# Patient Record
Sex: Female | Born: 1960
Health system: Southern US, Community
[De-identification: ages and names within clinical notes are randomized; demographics above are authoritative.]

## PROBLEM LIST (undated history)

## (undated) DIAGNOSIS — I4891 Unspecified atrial fibrillation: Secondary | ICD-10-CM

## (undated) DIAGNOSIS — K5732 Diverticulitis of large intestine without perforation or abscess without bleeding: Secondary | ICD-10-CM

## (undated) DIAGNOSIS — Z972 Presence of dental prosthetic device (complete) (partial): Secondary | ICD-10-CM

## (undated) DIAGNOSIS — I10 Essential (primary) hypertension: Secondary | ICD-10-CM

## (undated) DIAGNOSIS — J189 Pneumonia, unspecified organism: Secondary | ICD-10-CM

## (undated) DIAGNOSIS — I499 Cardiac arrhythmia, unspecified: Secondary | ICD-10-CM

## (undated) DIAGNOSIS — M1990 Unspecified osteoarthritis, unspecified site: Secondary | ICD-10-CM

## (undated) DIAGNOSIS — Z973 Presence of spectacles and contact lenses: Secondary | ICD-10-CM

## (undated) DIAGNOSIS — F419 Anxiety disorder, unspecified: Secondary | ICD-10-CM

## (undated) DIAGNOSIS — E785 Hyperlipidemia, unspecified: Secondary | ICD-10-CM

## (undated) HISTORY — PX: PLACEMENT OF BREAST IMPLANTS: SHX6334

## (undated) HISTORY — PX: OTHER SURGICAL HISTORY: SHX169

## (undated) HISTORY — PX: REDUCTION MAMMAPLASTY: SUR839

## (undated) HISTORY — PX: CARDIAC ELECTROPHYSIOLOGY MAPPING AND ABLATION: SHX1292

## (undated) HISTORY — DX: Essential (primary) hypertension: I10

## (undated) HISTORY — PX: TONSILLECTOMY AND ADENOIDECTOMY: SHX28

## (undated) HISTORY — DX: Hyperlipidemia, unspecified: E78.5

## (undated) HISTORY — PX: CHOLECYSTECTOMY: SHX55

## (undated) HISTORY — PX: VAGINAL HYSTERECTOMY: SHX2639

## (undated) HISTORY — PX: BREAST IMPLANT REMOVAL: SUR1101

## (undated) HISTORY — DX: Diverticulitis of large intestine without perforation or abscess without bleeding: K57.32

## (undated) HISTORY — PX: ABDOMINAL HYSTERECTOMY: SHX81

---

## 1987-08-27 HISTORY — PX: ABDOMINAL HYSTERECTOMY: SHX81

## 1998-01-02 ENCOUNTER — Emergency Department (HOSPITAL_COMMUNITY): Admission: EM | Admit: 1998-01-02 | Discharge: 1998-01-02 | Payer: Self-pay | Admitting: Emergency Medicine

## 1998-08-26 HISTORY — PX: BLADDER SURGERY: SHX569

## 1998-09-27 ENCOUNTER — Emergency Department (HOSPITAL_COMMUNITY): Admission: EM | Admit: 1998-09-27 | Discharge: 1998-09-27 | Payer: Self-pay | Admitting: Emergency Medicine

## 2000-05-30 ENCOUNTER — Emergency Department (HOSPITAL_COMMUNITY): Admission: EM | Admit: 2000-05-30 | Discharge: 2000-05-31 | Payer: Self-pay | Admitting: Emergency Medicine

## 2000-05-31 ENCOUNTER — Encounter: Payer: Self-pay | Admitting: Emergency Medicine

## 2008-09-12 ENCOUNTER — Emergency Department (HOSPITAL_COMMUNITY): Admission: EM | Admit: 2008-09-12 | Discharge: 2008-09-13 | Payer: Self-pay | Admitting: Emergency Medicine

## 2008-09-15 ENCOUNTER — Ambulatory Visit: Payer: Self-pay

## 2008-09-15 ENCOUNTER — Ambulatory Visit: Payer: Self-pay | Admitting: Cardiology

## 2008-09-16 ENCOUNTER — Ambulatory Visit: Payer: Self-pay

## 2008-09-16 ENCOUNTER — Encounter: Payer: Self-pay | Admitting: Cardiology

## 2008-10-25 ENCOUNTER — Ambulatory Visit: Payer: Self-pay | Admitting: Cardiology

## 2008-10-25 ENCOUNTER — Encounter: Payer: Self-pay | Admitting: Cardiology

## 2008-10-25 DIAGNOSIS — F17200 Nicotine dependence, unspecified, uncomplicated: Secondary | ICD-10-CM | POA: Insufficient documentation

## 2008-10-25 DIAGNOSIS — R002 Palpitations: Secondary | ICD-10-CM | POA: Insufficient documentation

## 2008-10-25 DIAGNOSIS — R0602 Shortness of breath: Secondary | ICD-10-CM | POA: Insufficient documentation

## 2009-01-13 ENCOUNTER — Telehealth: Payer: Self-pay | Admitting: Cardiology

## 2009-03-13 ENCOUNTER — Encounter (INDEPENDENT_AMBULATORY_CARE_PROVIDER_SITE_OTHER): Payer: Self-pay | Admitting: *Deleted

## 2009-06-07 ENCOUNTER — Telehealth: Payer: Self-pay | Admitting: Cardiology

## 2009-06-07 ENCOUNTER — Encounter: Payer: Self-pay | Admitting: Cardiology

## 2009-08-14 ENCOUNTER — Telehealth: Payer: Self-pay | Admitting: Cardiology

## 2009-08-24 ENCOUNTER — Encounter (INDEPENDENT_AMBULATORY_CARE_PROVIDER_SITE_OTHER): Payer: Self-pay | Admitting: *Deleted

## 2010-12-10 LAB — CBC
HCT: 43.9 % (ref 36.0–46.0)
MCV: 93.8 fL (ref 78.0–100.0)
Platelets: 218 10*3/uL (ref 150–400)
RDW: 12.4 % (ref 11.5–15.5)

## 2010-12-10 LAB — BASIC METABOLIC PANEL
BUN: 12 mg/dL (ref 6–23)
CO2: 25 mEq/L (ref 19–32)
Calcium: 9.3 mg/dL (ref 8.4–10.5)
Chloride: 108 mEq/L (ref 96–112)
Creatinine, Ser: 0.76 mg/dL (ref 0.4–1.2)
GFR calc Af Amer: >60 mL/min (ref >60)
GFR calc non Af Amer: >60 mL/min (ref >60)
Glucose, Bld: 96 mg/dL (ref 70–99)
Potassium: 3.7 mEq/L (ref 3.5–5.1)
Sodium: 142 mEq/L (ref 135–145)

## 2010-12-10 LAB — URINALYSIS, ROUTINE W REFLEX MICROSCOPIC
Bilirubin Urine: NEGATIVE
Glucose, UA: NEGATIVE mg/dL
Hgb urine dipstick: NEGATIVE
Ketones, ur: NEGATIVE mg/dL
Nitrite: NEGATIVE
Protein, ur: NEGATIVE mg/dL
Specific Gravity, Urine: 1.009 (ref 1.005–1.030)
Urobilinogen, UA: 0.2 mg/dL (ref 0.0–1.0)
pH: 5.5 (ref 5.0–8.0)

## 2010-12-10 LAB — POCT CARDIAC MARKERS
CKMB, poc: <1 ng/mL — ABNORMAL LOW (ref 1.0–8.0)
CKMB, poc: <1 ng/mL — ABNORMAL LOW (ref 1.0–8.0)
Troponin i, poc: <0.05 ng/mL (ref 0.00–0.09)

## 2010-12-10 LAB — RAPID URINE DRUG SCREEN, HOSP PERFORMED
Amphetamines: NOT DETECTED
Barbiturates: NOT DETECTED
Benzodiazepines: NOT DETECTED
Cocaine: NOT DETECTED
Opiates: NOT DETECTED
Tetrahydrocannabinol: NOT DETECTED

## 2010-12-10 LAB — DIFFERENTIAL
Basophils Absolute: 0.1 10*3/uL (ref 0.0–0.1)
Eosinophils Absolute: 0.1 10*3/uL (ref 0.0–0.7)
Eosinophils Relative: 1 % (ref 0–5)

## 2010-12-10 LAB — PROTIME-INR: Prothrombin Time: 13.1 seconds (ref 11.6–15.2)

## 2010-12-10 LAB — CK TOTAL AND CKMB (NOT AT ARMC): Total CK: 268 U/L — ABNORMAL HIGH (ref 7–177)

## 2010-12-10 LAB — TROPONIN I: Troponin I: 0.01 ng/mL (ref 0.00–0.06)

## 2011-01-08 NOTE — Assessment & Plan Note (Signed)
[facility]                            CARDIOLOGY OFFICE NOTE   BERNARDETTE, [name]                        MRN:          [id number]  DATE:09/15/2008                            DOB:          1961-01-20    The patient is a 50 year old female with no prior cardiac history, who I  am asked to evaluate for palpitations and dyspnea.  Note, she typically  does not have dyspnea on exertion unless is with more extreme  activities.  There is no orthopnea, PND, pedal edema, syncope, or chest  pain.  Approximately 7 months ago, she had an episode at work, when she  was sitting down, where her heart started pounding heart and racing.  This lasts for approximately 30 minutes and they have resolved  spontaneously.  It was sudden in onset and not related activities.  There is no associated chest pain, shortness breath, or syncope.  She  had a second episode several months later.  On September 12, 2008, the  patient had an episode that was more sustained.  This had began at work  and she went home, but it persisted.  Also, note that she tried to  exercise at the gym with this, but felt some dizziness.  Ultimately, she  was taken to [facility].  I do have electrocardiograms  from that evaluation.  The initial electrocardiogram showed a sinus  tachycardia at rate of 127.  There was a right ventricular conduction  delay.  A prior inferior infarct cannot be excluded.  She had a second  electrocardiogram that showed a sinus tachycardia at a rate of 119.  Again, a prior inferior infarct cannot be excluded.  She was told that  she maybe somewhat dehydrated.  They also check TSH and hemoglobin that  was normal.  We were asked to further evaluate.  She has felt fine since  then.   MEDICATIONS:  Detrol 4 mg p.o. daily.   She has no known drug allergies.   SOCIAL HISTORY:  She does smoke.  She rarely consumes alcohol.  She is  married.   FAMILY HISTORY:   Significant for father who had congestive heart  failure.  Her mother has had a previous pacemaker.   PAST MEDICAL HISTORY:  Significant for a multiple abdominal surgeries.  She has had a prior cholecystectomy, hysterectomy, C-section, and  appendectomy.  She has had exterior and interior wraps and  reconstruction per her bladder.   REVIEW OF SYSTEMS:  She denies any headaches, fevers, or chills.  There  is no productive cough or hemoptysis.  There is no dysphagia,  odynophagia, melena, or hematochezia.  There is no dysuria or  hematuria,.  There is no rash or seizure activity.  There is no  orthopnea, PND, or pedal edema.  She has lost approximately 10 pounds  recently due to exercise and diet by her.  Remaining systems are  negative.   PHYSICAL EXAMINATION:  VITAL SIGNS:  Today shows a blood pressure of  118/80 and her pulse is 56.  She weighs 225 pounds.  GENERAL:  She  is well-developed and somewhat obese.  She is in no acute  distress at present.  SKIN:  Warm and dry.  She does not appear to be depressed.  There is no  peripheral clubbing.  BACK:  Normal.  HEENT:  Normal with normal eyelids.  NECK:  Supple with normal upstroke bilaterally.  There are no bruits  noted.  There is no jugular venous distention and I cannot appreciate  thyromegaly.  CHEST:  Clear to auscultation.  There is no expansion.  CARDIOVASCULAR:  Regular rhythm with normal S1 and S2.  There are no  murmurs, rubs, or gallops noted.  ABDOMEN:  Nontender.  Positive bowel sounds.  No hepatosplenomegaly.  No  masses are appreciated.  There is no abdominal bruit.  EXTREMITIES:  She has 2+ femoral pulses bilaterally without any bruits.  Extremities showed no edema.  I can palpate no cords.  She has 2+  dorsalis pedis pulses bilaterally.  NEUROLOGIC:  Grossly intact.   DIAGNOSES:  Recent palpitations - the patient's description sound  concerning for supraventricular tachycardia.  She does have   electrocardiograms from the emergency room that showed sinus  tachycardia, but it is not clear to me that she was having the symptoms  at that time.  I will schedule her to have CardioNet monitor for full  evaluation.  We will also schedule her to have an echocardiogram to  quantify left ventricular function.  I will see her back in 4-6 weeks  and review this.  If indeed she is having palpitations related to sinus  tachycardia, and we may need to add a beta-blocker.  If she has a  significant arrhythmia, then we could consider referral to an  electrophysiologist for ablation.     Madolyn Frieze Jens Som, MD, [facility]  Electronically Signed    BSC/MedQ  DD: 09/15/2008  DT: 09/16/2008  Job #: [id number]   cc:   Jocelyn Lamer D. Pecola Leisure, M.D.

## 2013-08-26 HISTORY — PX: LOOP RECORDER IMPLANT: SHX5954

## 2013-08-26 HISTORY — PX: OTHER SURGICAL HISTORY: SHX169

## 2014-02-07 ENCOUNTER — Other Ambulatory Visit: Payer: Self-pay | Admitting: Internal Medicine

## 2014-02-07 DIAGNOSIS — Z139 Encounter for screening, unspecified: Secondary | ICD-10-CM

## 2014-02-09 ENCOUNTER — Other Ambulatory Visit: Payer: Self-pay | Admitting: Internal Medicine

## 2014-02-09 DIAGNOSIS — Z1211 Encounter for screening for malignant neoplasm of colon: Secondary | ICD-10-CM

## 2014-02-14 ENCOUNTER — Other Ambulatory Visit: Payer: Self-pay

## 2014-04-04 ENCOUNTER — Inpatient Hospital Stay: Admission: RE | Admit: 2014-04-04 | Payer: Self-pay | Source: Ambulatory Visit

## 2015-07-26 ENCOUNTER — Other Ambulatory Visit: Payer: Self-pay | Admitting: Family Medicine

## 2015-07-26 DIAGNOSIS — Q438 Other specified congenital malformations of intestine: Secondary | ICD-10-CM

## 2017-01-16 ENCOUNTER — Ambulatory Visit (INDEPENDENT_AMBULATORY_CARE_PROVIDER_SITE_OTHER): Payer: Self-pay

## 2017-01-16 ENCOUNTER — Ambulatory Visit (INDEPENDENT_AMBULATORY_CARE_PROVIDER_SITE_OTHER): Payer: BLUE CROSS/BLUE SHIELD | Admitting: Orthopaedic Surgery

## 2017-01-16 ENCOUNTER — Encounter (INDEPENDENT_AMBULATORY_CARE_PROVIDER_SITE_OTHER): Payer: Self-pay | Admitting: Orthopaedic Surgery

## 2017-01-16 DIAGNOSIS — M2242 Chondromalacia patellae, left knee: Secondary | ICD-10-CM | POA: Diagnosis not present

## 2017-01-16 MED ORDER — BUPIVACAINE HCL 0.5 % IJ SOLN
2.0000 mL | INTRAMUSCULAR | Status: AC | PRN
  Administered 2017-01-16: 2 mL via INTRA_ARTICULAR

## 2017-01-16 MED ORDER — LIDOCAINE HCL 1 % IJ SOLN
2.0000 mL | INTRAMUSCULAR | Status: AC | PRN
Start: 2017-01-16 — End: 2017-01-16
  Administered 2017-01-16: 2 mL

## 2017-01-16 MED ORDER — METHYLPREDNISOLONE ACETATE 40 MG/ML IJ SUSP
40.0000 mg | INTRAMUSCULAR | Status: AC | PRN
  Administered 2017-01-16: 40 mg via INTRA_ARTICULAR

## 2017-01-16 NOTE — Progress Notes (Signed)
Office Visit Note   Patient: [name]           Date of Birth: 1961-04-04           MRN: [id number] Visit Date: 01/16/2017              Requested by: No referring provider defined for this encounter. PCP: Chesley Noon, MD   Assessment & Plan: Visit Diagnoses:  1. Chondromalacia of left patella     Plan: Impression is left chondromalacia patella. I discussed exercise, weight loss, quad strengthening. We also performed a knee injection which she tolerated well and had good immediate relief. Home exercise program was also given. Follow up with me as needed.  Follow-Up Instructions: Return if symptoms worsen or fail to improve.   Orders:  Orders Placed This Encounter  Procedures  . XR KNEE 3 VIEW LEFT   No orders of the defined types were placed in this encounter.     Procedures: Large Joint Inj Date/Time: 01/16/2017 11:49 AM Performed by: Leandrew Koyanagi Authorized by: Leandrew Koyanagi   Consent Given by:  Patient Timeout: prior to procedure the correct patient, procedure, and site was verified   Indications:  Pain Location:  Knee Site:  R knee Prep: patient was prepped and draped in usual sterile fashion   Needle Size:  22 G Ultrasound Guidance: No   Fluoroscopic Guidance: No   Arthrogram: No   Medications:  2 mL lidocaine 1 %; 2 mL bupivacaine 0.5 %; 40 mg methylPREDNISolone acetate 40 MG/ML Patient tolerance:  Patient tolerated the procedure well with no immediate complications     Clinical Data: No additional findings.   Subjective: Chief Complaint  Patient presents with  . Left Knee - Pain, New Patient (Initial Visit)    Patient is a [name] comes in with left knee pain for about a week and half. She endorses stiffness and swelling and worsening pain with flexion knee. This is also worse when riding her motorcycle. She feels like the knee wants to give out. She also feels a tightness in the back of her knee. She takes ibuprofen which  does give relief. She denies any mechanical symptoms.    Review of Systems  Constitutional: Negative.   HENT: Negative.   Eyes: Negative.   Respiratory: Negative.   Cardiovascular: Negative.   Endocrine: Negative.   Musculoskeletal: Negative.   Neurological: Negative.   Hematological: Negative.   Psychiatric/Behavioral: Negative.   All other systems reviewed and are negative.    Objective: Vital Signs: There were no vitals taken for this visit.  Physical Exam  Constitutional: She is oriented to person, place, and time. She appears well-developed and well-nourished.  HENT:  Head: Normocephalic and atraumatic.  Eyes: EOM are normal.  Neck: Neck supple.  Pulmonary/Chest: Effort normal.  Abdominal: Soft.  Neurological: She is alert and oriented to person, place, and time.  Skin: Skin is warm. Capillary refill takes less than 2 seconds.  Psychiatric: She has a normal mood and affect. Her behavior is normal. Judgment and thought content normal.  Nursing note and vitals reviewed.   Ortho Exam Left knee exam shows no joint effusion. She does have positive patellar crepitus. Collaterals and cruciate's are stable. No joint line tenderness. Patellar tracking is normal. Specialty Comments:  No specialty comments available.  Imaging: Xr Knee 3 View Left  Result Date: 01/16/2017 Mild osteoarthritis    PMFS History: Patient Active Problem List   Diagnosis Date Noted  .  Chondromalacia of left patella 01/16/2017  . TOBACCO ABUSE 10/25/2008  . PALPITATIONS 10/25/2008  . DYSPNEA 10/25/2008   No past medical history on file.  No family history on file.  Past Surgical History:  Procedure Laterality Date  . CESAREAN SECTION    . CHOLECYSTECTOMY    . fallopian tube removal    . TONSILLECTOMY AND ADENOIDECTOMY    . VAGINAL HYSTERECTOMY     Social History   Occupational History  . machine operator    Social History Main Topics  . Smoking status: Former Research scientist (life sciences)  .  Smokeless tobacco: Never Used  . Alcohol use Not on file  . Drug use: Unknown  . Sexual activity: Not on file

## 2017-02-12 DIAGNOSIS — Z1211 Encounter for screening for malignant neoplasm of colon: Secondary | ICD-10-CM | POA: Diagnosis not present

## 2017-02-12 DIAGNOSIS — I480 Paroxysmal atrial fibrillation: Secondary | ICD-10-CM | POA: Diagnosis not present

## 2017-02-12 DIAGNOSIS — F411 Generalized anxiety disorder: Secondary | ICD-10-CM | POA: Diagnosis not present

## 2017-02-12 DIAGNOSIS — R32 Unspecified urinary incontinence: Secondary | ICD-10-CM | POA: Diagnosis not present

## 2017-03-17 ENCOUNTER — Ambulatory Visit (INDEPENDENT_AMBULATORY_CARE_PROVIDER_SITE_OTHER): Payer: BLUE CROSS/BLUE SHIELD | Admitting: Orthopaedic Surgery

## 2017-03-17 ENCOUNTER — Encounter (INDEPENDENT_AMBULATORY_CARE_PROVIDER_SITE_OTHER): Payer: Self-pay | Admitting: Orthopaedic Surgery

## 2017-03-17 DIAGNOSIS — M2242 Chondromalacia patellae, left knee: Secondary | ICD-10-CM | POA: Diagnosis not present

## 2017-03-17 MED ORDER — DICLOFENAC SODIUM 75 MG PO TBEC: 75.0000 mg | DELAYED_RELEASE_TABLET | Freq: Two times a day (BID) | ORAL | 2 refills | Status: DC

## 2017-03-17 NOTE — Progress Notes (Signed)
   Office Visit Note   Patient: [name]           Date of Birth: 01-23-1961           MRN: [id number] Visit Date: 03/17/2017              Requested by: Chesley Noon, MD [place], [place] [address] PCP: Chesley Noon, MD   Assessment & Plan: Visit Diagnoses:  1. Chondromalacia of left patella     Plan: Doppler to rule out DVT and MRI of the left knee to rule out structural abnormalities given failure of conservative treatment. Follow-up after the MRI. I will be in touch with the patient regarding the Doppler.  Follow-Up Instructions: Return in about 2 weeks (around 03/31/2017).   Orders:  No orders of the defined types were placed in this encounter.  Meds ordered this encounter  Medications  . diclofenac (VOLTAREN) 75 MG EC tablet    Sig: Take 1 tablet (75 mg total) by mouth 2 (two) times daily.    Dispense:  30 tablet    Refill:  2      Procedures: No procedures performed   Clinical Data: No additional findings.   Subjective: Chief Complaint  Patient presents with  . Left Knee - Pain    [name] comes back today for continued left knee pain and leg swelling. Her calf is also painful to palpation. Denies any numbness or tingling. Denies any chest pain shortness of breath.    Review of Systems  Constitutional: Negative.   HENT: Negative.   Eyes: Negative.   Respiratory: Negative.   Cardiovascular: Negative.   Endocrine: Negative.   Musculoskeletal: Negative.   Neurological: Negative.   Hematological: Negative.   Psychiatric/Behavioral: Negative.   All other systems reviewed and are negative.    Objective: Vital Signs: There were no vitals taken for this visit.  Physical Exam  Constitutional: She is oriented to person, place, and time. She appears well-developed and well-nourished.  Pulmonary/Chest: Effort normal.  Neurological: She is alert and oriented to person, place, and time.  Skin: Skin is warm. Capillary refill  takes less than 2 seconds.  Psychiatric: She has a normal mood and affect. Her behavior is normal. Judgment and thought content normal.  Nursing note and vitals reviewed.   Ortho Exam Left knee exam shows no joint effusion. She does have swelling of her left ankle and lower extremity. Specialty Comments:  No specialty comments available.  Imaging: No results found.   PMFS History: Patient Active Problem List   Diagnosis Date Noted  . Chondromalacia of left patella 01/16/2017  . TOBACCO ABUSE 10/25/2008  . PALPITATIONS 10/25/2008  . DYSPNEA 10/25/2008   No past medical history on file.  No family history on file.  Past Surgical History:  Procedure Laterality Date  . CESAREAN SECTION    . CHOLECYSTECTOMY    . fallopian tube removal    . TONSILLECTOMY AND ADENOIDECTOMY    . VAGINAL HYSTERECTOMY     Social History   Occupational History  . machine operator    Social History Main Topics  . Smoking status: Former Research scientist (life sciences)  . Smokeless tobacco: Never Used  . Alcohol use Not on file  . Drug use: Unknown  . Sexual activity: Not on file

## 2017-03-17 NOTE — Addendum Note (Signed)
Addended by: Precious Bard on: 03/17/2017 03:33 PM   Modules accepted: Orders

## 2017-03-20 ENCOUNTER — Ambulatory Visit (HOSPITAL_COMMUNITY): Payer: BLUE CROSS/BLUE SHIELD

## 2017-03-21 ENCOUNTER — Ambulatory Visit (HOSPITAL_COMMUNITY): Payer: BLUE CROSS/BLUE SHIELD

## 2017-03-21 ENCOUNTER — Ambulatory Visit (HOSPITAL_COMMUNITY)
Admission: RE | Admit: 2017-03-21 | Discharge: 2017-03-21 | Disposition: A | Discharge status: Home / Self Care | Payer: BLUE CROSS/BLUE SHIELD | Source: Ambulatory Visit | Attending: Orthopaedic Surgery | Admitting: Orthopaedic Surgery

## 2017-03-21 DIAGNOSIS — M2242 Chondromalacia patellae, left knee: Secondary | ICD-10-CM | POA: Diagnosis not present

## 2017-03-21 NOTE — Progress Notes (Signed)
VASCULAR LAB PRELIMINARY  PRELIMINARY  PRELIMINARY  PRELIMINARY  Left lower extremity venous duplex completed.    Preliminary report:  Left:  No evidence of DVT, superficial thrombosis, or Baker's cyst.  Kaicee Scarpino, RVS 03/21/2017, 3:35 PM

## 2017-03-29 ENCOUNTER — Ambulatory Visit
Admission: RE | Admit: 2017-03-29 | Discharge: 2017-03-29 | Disposition: A | Discharge status: Home / Self Care | Payer: BLUE CROSS/BLUE SHIELD | Source: Ambulatory Visit | Attending: Orthopaedic Surgery | Admitting: Orthopaedic Surgery

## 2017-03-29 DIAGNOSIS — M2242 Chondromalacia patellae, left knee: Secondary | ICD-10-CM

## 2017-03-29 DIAGNOSIS — M25562 Pain in left knee: Secondary | ICD-10-CM | POA: Diagnosis not present

## 2017-03-31 ENCOUNTER — Ambulatory Visit (INDEPENDENT_AMBULATORY_CARE_PROVIDER_SITE_OTHER): Payer: BLUE CROSS/BLUE SHIELD | Admitting: Orthopaedic Surgery

## 2017-03-31 DIAGNOSIS — M2242 Chondromalacia patellae, left knee: Secondary | ICD-10-CM | POA: Diagnosis not present

## 2017-03-31 NOTE — Progress Notes (Signed)
   Office Visit Note   Patient: [name]           Date of Birth: 04-16-1961           MRN: [id number] Visit Date: 03/31/2017              Requested by: Chesley Noon, MD [place], [place] [address] PCP: Chesley Noon, MD   Assessment & Plan: Visit Diagnoses:  1. Chondromalacia of left patella     Plan: Insurance preapproval for Synvisc injection submitted today. MRI essentially shows degenerative changes without any acute meniscal pathology. Small Baker cyst. Follow-up for the injection.  Follow-Up Instructions: Return if symptoms worsen or fail to improve.   Orders:  No orders of the defined types were placed in this encounter.  No orders of the defined types were placed in this encounter.     Procedures: No procedures performed   Clinical Data: No additional findings.   Subjective: Chief Complaint  Patient presents with  . Left Knee - Pain    [name] follows up today to review her left knee MRI. She continues to have pain. Diclofenac later swell up. Cortisone gave her minimal and temporary relief.    Review of Systems   Objective: Vital Signs: There were no vitals taken for this visit.  Physical Exam  Ortho Exam Left knee exam is stable. Specialty Comments:  No specialty comments available.  Imaging: No results found.   PMFS History: Patient Active Problem List   Diagnosis Date Noted  . Chondromalacia of left patella 01/16/2017  . TOBACCO ABUSE 10/25/2008  . PALPITATIONS 10/25/2008  . DYSPNEA 10/25/2008   No past medical history on file.  No family history on file.  Past Surgical History:  Procedure Laterality Date  . CESAREAN SECTION    . CHOLECYSTECTOMY    . fallopian tube removal    . TONSILLECTOMY AND ADENOIDECTOMY    . VAGINAL HYSTERECTOMY     Social History   Occupational History  . machine operator    Social History Main Topics  . Smoking status: Former Research scientist (life sciences)  . Smokeless tobacco: Never Used    . Alcohol use Not on file  . Drug use: Unknown  . Sexual activity: Not on file

## 2017-04-24 ENCOUNTER — Telehealth (INDEPENDENT_AMBULATORY_CARE_PROVIDER_SITE_OTHER): Payer: Self-pay | Admitting: Orthopaedic Surgery

## 2017-04-24 DIAGNOSIS — M1712 Unilateral primary osteoarthritis, left knee: Secondary | ICD-10-CM | POA: Diagnosis not present

## 2017-04-24 NOTE — Telephone Encounter (Signed)
Returned call to [employer] spoke with terri she advised will ship the Synvisc tomorrow for patient. Via [employer]  Ph# [phone]

## 2017-04-29 ENCOUNTER — Ambulatory Visit (INDEPENDENT_AMBULATORY_CARE_PROVIDER_SITE_OTHER): Payer: BLUE CROSS/BLUE SHIELD | Admitting: Orthopaedic Surgery

## 2017-04-29 DIAGNOSIS — M2242 Chondromalacia patellae, left knee: Secondary | ICD-10-CM

## 2017-04-29 MED ORDER — HYLAN G-F 20 48 MG/6ML IX SOSY
48.0000 mg | PREFILLED_SYRINGE | INTRA_ARTICULAR | Status: AC | PRN
  Administered 2017-04-29: 48 mg via INTRA_ARTICULAR

## 2017-04-29 NOTE — Progress Notes (Signed)
   Procedure Note  Patient: [name]             Date of Birth: March 09, 1961           MRN: [id number]             Visit Date: 04/29/2017  Procedures: Visit Diagnoses: Chondromalacia of left patella  Large Joint Inj Date/Time: 04/29/2017 8:44 AM Performed by: Leandrew Koyanagi Authorized by: Leandrew Koyanagi   Consent Given by:  Patient Timeout: prior to procedure the correct patient, procedure, and site was verified   Indications:  Pain Location:  Knee Site:  L knee Prep: patient was prepped and draped in usual sterile fashion   Needle Size:  22 G Approach:  Anterolateral Ultrasound Guidance: No   Fluoroscopic Guidance: No   Arthrogram: No   Medications:  48 mg Hylan 48 MG/6ML

## 2017-05-04 ENCOUNTER — Encounter (HOSPITAL_COMMUNITY): Payer: Self-pay | Admitting: *Deleted

## 2017-05-04 ENCOUNTER — Inpatient Hospital Stay (HOSPITAL_COMMUNITY): Admit: 2017-05-04 | Payer: BLUE CROSS/BLUE SHIELD

## 2017-05-04 ENCOUNTER — Emergency Department (HOSPITAL_COMMUNITY)
Admission: EM | Admit: 2017-05-04 | Discharge: 2017-05-04 | Disposition: A | Discharge status: Home / Self Care | Payer: BLUE CROSS/BLUE SHIELD | Attending: Emergency Medicine | Admitting: Emergency Medicine

## 2017-05-04 ENCOUNTER — Emergency Department (HOSPITAL_COMMUNITY): Payer: BLUE CROSS/BLUE SHIELD

## 2017-05-04 DIAGNOSIS — R0602 Shortness of breath: Secondary | ICD-10-CM | POA: Diagnosis not present

## 2017-05-04 DIAGNOSIS — Z7982 Long term (current) use of aspirin: Secondary | ICD-10-CM | POA: Insufficient documentation

## 2017-05-04 DIAGNOSIS — I480 Paroxysmal atrial fibrillation: Secondary | ICD-10-CM | POA: Diagnosis not present

## 2017-05-04 DIAGNOSIS — Z79899 Other long term (current) drug therapy: Secondary | ICD-10-CM | POA: Insufficient documentation

## 2017-05-04 DIAGNOSIS — R002 Palpitations: Secondary | ICD-10-CM | POA: Diagnosis not present

## 2017-05-04 LAB — CBC
HCT: 43.5 % (ref 36.0–46.0)
HEMOGLOBIN: 14.9 g/dL (ref 12.0–15.0)
MCH: 31.8 pg (ref 26.0–34.0)
MCHC: 34.3 g/dL (ref 30.0–36.0)
MCV: 92.9 fL (ref 78.0–100.0)
Platelets: 210 10*3/uL (ref 150–400)
RBC: 4.68 MIL/uL (ref 3.87–5.11)
RDW: 12.8 % (ref 11.5–15.5)
WBC: 6.5 10*3/uL (ref 4.0–10.5)

## 2017-05-04 LAB — BASIC METABOLIC PANEL
ANION GAP: 7 (ref 5–15)
BUN: 9 mg/dL (ref 6–20)
CALCIUM: 9 mg/dL (ref 8.9–10.3)
CHLORIDE: 107 mmol/L (ref 101–111)
CO2: 27 mmol/L (ref 22–32)
CREATININE: 0.58 mg/dL (ref 0.44–1.00)
Glucose, Bld: 87 mg/dL (ref 65–99)
Potassium: 3.7 mmol/L (ref 3.5–5.1)
SODIUM: 141 mmol/L (ref 135–145)

## 2017-05-04 LAB — I-STAT TROPONIN, ED: Troponin i, poc: 0 ng/mL (ref 0.00–0.08)

## 2017-05-04 LAB — D-DIMER, QUANTITATIVE (NOT AT ARMC): D DIMER QUANT: 0.29 ug{FEU}/mL (ref 0.00–0.50)

## 2017-05-04 NOTE — ED Notes (Signed)
Bed: WA03 Expected date:  Expected time:  Means of arrival:  Comments: 

## 2017-05-04 NOTE — ED Triage Notes (Signed)
Pt states she feels like she went into atrial fibrillation today. Pt states she felt flushed and had chest tightness, which are similar symptoms she had in the past. Pt states to took 325mg  aspirin. Pt had cardiac ablation d/t atrial fibrillation in 2015 and has not had problems since.

## 2017-05-04 NOTE — ED Provider Notes (Signed)
[place] DEPT Provider Note   CSN: [id number] Arrival date & time: 05/04/17  1055     History   Chief Complaint Chief Complaint  Patient presents with  . Atrial Fibrillation    HPI [name] is a 56 y.o. female presenting with concern for A. Fib.  Patient states she has a history of A. fib, but had an ablation 2015. Since then, she has not had any symptoms. She reports today at church, she developed flushing, chest tightness and palpitations. She subsequently took 325 aspirin and came to the emergency room. These are the symptoms that she had when she was in A. fib in the past. Symptoms lasted for about 45 minutes, and resolved spontaneously. She denies current flushing or palpitations, stating that her chest still feels tight. She denies fever, chills, congestion, cough, chest pain, shortness of breath, nausea, vomiting, abdominal pain, urinary symptoms, or abnormal bowel movements. She reports chronic left leg swelling, worsened recently. She had a Doppler 4 weeks ago it which was negative for DVT. Patient reports increased swelling and pain of her left lower leg today. She denies recent extended travel, hormone use, history of clots, or personal history of cancer. Patient smokes cigarettes. She was on a thinners for a year following her ablation, but has not been on them since. She has no other medical problem, does not take any medications daily.    HPI  History reviewed. No pertinent past medical history.  Patient Active Problem List   Diagnosis Date Noted  . Chondromalacia of left patella 01/16/2017  . TOBACCO ABUSE 10/25/2008  . PALPITATIONS 10/25/2008  . DYSPNEA 10/25/2008    Past Surgical History:  Procedure Laterality Date  . CESAREAN SECTION    . CHOLECYSTECTOMY    . fallopian tube removal    . TONSILLECTOMY AND ADENOIDECTOMY    . VAGINAL HYSTERECTOMY      OB History    No data available       Home Medications    Prior to Admission medications     Medication Sig Start Date End Date Taking? Authorizing Provider  aspirin EC 325 MG tablet Take 325 mg by mouth once.   Yes [provider]  Melatonin 10 MG TBDP Take 10 mg by mouth at bedtime as needed (sleep).   Yes [provider]  diclofenac (VOLTAREN) 75 MG EC tablet Take 1 tablet (75 mg total) by mouth 2 (two) times daily. Patient not taking: Reported on 05/04/2017 03/17/17   Leandrew Koyanagi, MD    Family History No family history on file.  Social History Social History  Substance Use Topics  . Smoking status: Former Research scientist (life sciences)  . Smokeless tobacco: Never Used  . Alcohol use Not on file     Allergies   Patient has no known allergies.   Review of Systems Review of Systems  Constitutional:       Felt flushed, resolved  Respiratory: Positive for chest tightness.   Cardiovascular: Positive for palpitations (Resolved) and leg swelling.  All other systems reviewed and are negative.    Physical Exam Updated Vital Signs BP (!) 126/93   Pulse 75   Temp 98.4 F (36.9 C) (Oral)   Resp 17   SpO2 98%   Physical Exam  Constitutional: She is oriented to person, place, and time. She appears well-developed and well-nourished. No distress.  HENT:  Head: Normocephalic and atraumatic.  Mouth/Throat: Uvula is midline, oropharynx is clear and moist and mucous membranes are normal.  Eyes: EOM  are normal.  Neck: Normal range of motion.  Cardiovascular: Regular rhythm and intact distal pulses.  Tachycardia present.   Pulmonary/Chest: Effort normal and breath sounds normal. No respiratory distress. She has no wheezes. She has no rales. She exhibits no tenderness.  Abdominal: Soft. Bowel sounds are normal. She exhibits no distension. There is no tenderness.  Musculoskeletal: Normal range of motion.  Lymphadenopathy:    She has no cervical adenopathy.  Neurological: She is alert and oriented to person, place, and time.  Skin: Skin is warm and dry.  Psychiatric: She has a  normal mood and affect.  Nursing note and vitals reviewed.    ED Treatments / Results  Labs (all labs ordered are listed, but only abnormal results are displayed) Labs Reviewed  BASIC METABOLIC PANEL  CBC  D-DIMER, QUANTITATIVE (NOT AT [facility])  I-STAT TROPONIN, ED    EKG  EKG Interpretation  Date/Time:  Sunday May 04 2017 11:05:55 EDT Ventricular Rate:  107 PR Interval:    QRS Duration: 94 QT Interval:  339 QTC Calculation: 453 R Axis:   20 Text Interpretation:  Sinus tachycardia Multiple premature complexes, vent & supraven Probable left atrial enlargement no significant change since 2010 Confirmed by Sherwood Gambler [phone]) on 05/04/2017 11:21:29 AM       Radiology Dg Chest 2 View  Result Date: 05/04/2017 CLINICAL DATA:  Pt states she is having SOb and chest tightness today. Pt has no other chest complaints at this time. PT HX: ex smoker EXAM: CHEST  2 VIEW COMPARISON:  Chest x-ray dated 09/12/2008. FINDINGS: Heart size is upper normal, stable. Overall cardiomediastinal silhouette is normal. Lungs are at least mildly hyperexpanded. Lungs are clear. No pleural effusion or pneumothorax seen. Mild degenerative spurring noted within the thoracic spine. No acute or suspicious osseous finding. IMPRESSION: 1. Lungs at least mildly hyperexpanded suggesting COPD. 2. No acute findings.  No evidence of pneumonia or pulmonary edema. Electronically Signed   By: Franki Cabot M.D.   On: 05/04/2017 11:56    Procedures Procedures (including critical care time)  Medications Ordered in ED Medications - No data to display   Initial Impression / Assessment and Plan / ED Course  I have reviewed the triage vital signs and the nursing notes.  Pertinent labs & imaging results that were available during my care of the patient were reviewed by me and considered in my medical decision making (see chart for details).     Patient presenting with acute onset palpitations, chest tightness, and  flushing. States it feels similar to last time she had A. fib. Symptoms have resolved currently. Physical exam reassuring, patient had a tachycardic sinus rhythm. No chest pain or shortness of breath. Will order basic cardiac labs, EKG, chest x-ray. As patient has had some increased swelling of a chronically swollen leg, will order d-dimer. No other risk factors for DVT.  Initial cardiac exam reassuring. Patient heart rate improved. Chest x-ray negative. D-dimer negative. Discussed case with attending, Dr. Regenia Skeeter evaluated the patient. Discussed findings with patient. Patient states she does not want to wait for a repeat troponin. Patient states she will follow-up with cardiology. Strict return precautions given. Patient states she understands and agrees to plan.  Final Clinical Impressions(s) / ED Diagnoses   Final diagnoses:  Paroxysmal atrial fibrillation [facility])    New Prescriptions New Prescriptions   No medications on file     Franchot Heidelberg, PA-C 05/04/17 1332    Sherwood Gambler, MD 05/08/17 1627

## 2017-05-04 NOTE — Discharge Instructions (Signed)
Is very important that you follow up with cardiology. Return to the emergency room if you have persistent chest pain, difficulty breathing, fevers, chill, vomiting, or any new or worsening symptoms.

## 2017-06-23 ENCOUNTER — Encounter (INDEPENDENT_AMBULATORY_CARE_PROVIDER_SITE_OTHER): Payer: Self-pay | Admitting: Orthopaedic Surgery

## 2017-06-23 ENCOUNTER — Ambulatory Visit (INDEPENDENT_AMBULATORY_CARE_PROVIDER_SITE_OTHER): Payer: BLUE CROSS/BLUE SHIELD | Admitting: Orthopaedic Surgery

## 2017-06-23 DIAGNOSIS — M1712 Unilateral primary osteoarthritis, left knee: Secondary | ICD-10-CM | POA: Diagnosis not present

## 2017-06-23 NOTE — Progress Notes (Signed)
Office Visit Note   Patient: [name]           Date of Birth: 1961/05/21           MRN: [id number] Visit Date: 06/23/2017              Requested by: Chesley Noon, MD [place], [place] [address] PCP: Chesley Noon, MD   Assessment & Plan: Visit Diagnoses:  1. Unilateral primary osteoarthritis, left knee     Plan: Impression is left knee osteoarthritis and degenerative joint disease.  At this point I reviewed her x-rays and her MRI which are consistent with these findings.  She unfortunately has not had any significant relief from conservative measures.  She now has significant impairment with ADLs.  We discussed the risk benefits alternatives to a knee replacement surgery which she would like to undergo.  Questions encouraged and answered.  We will schedule her in the near future  Follow-Up Instructions: Return if symptoms worsen or fail to improve.   Orders:  No orders of the defined types were placed in this encounter.  No orders of the defined types were placed in this encounter.     Procedures: No procedures performed   Clinical Data: No additional findings.   Subjective: Chief Complaint  Patient presents with  . Left Knee - Pain    Patient comes in today for follow-up status post Visco supplementation injection to her left knee.  She states that she had minimal relief from this.  She continues to have significant start up pain and stiffness.  She is starting to have significant impairment with ADLs.  She denies any numbness and tingling.    Review of Systems  Constitutional: Negative.   HENT: Negative.   Eyes: Negative.   Respiratory: Negative.   Cardiovascular: Negative.   Endocrine: Negative.   Musculoskeletal: Negative.   Neurological: Negative.   Hematological: Negative.   Psychiatric/Behavioral: Negative.   All other systems reviewed and are negative.    Objective: Vital Signs: There were no vitals taken for this  visit.  Physical Exam  Constitutional: She is oriented to person, place, and time. She appears well-developed and well-nourished.  Pulmonary/Chest: Effort normal.  Neurological: She is alert and oriented to person, place, and time.  Skin: Skin is warm. Capillary refill takes less than 2 seconds.  Psychiatric: She has a normal mood and affect. Her behavior is normal. Judgment and thought content normal.  Nursing note and vitals reviewed.   Ortho Exam Left knee exam shows no joint effusion.  Patella tracking is normal.  Collaterals and cruciates are grossly intact. Specialty Comments:  No specialty comments available.  Imaging: No results found.   PMFS History: Patient Active Problem List   Diagnosis Date Noted  . Unilateral primary osteoarthritis, left knee 06/23/2017  . Chondromalacia of left patella 01/16/2017  . TOBACCO ABUSE 10/25/2008  . PALPITATIONS 10/25/2008  . DYSPNEA 10/25/2008   No past medical history on file.  No family history on file.  Past Surgical History:  Procedure Laterality Date  . CESAREAN SECTION    . CHOLECYSTECTOMY    . fallopian tube removal    . TONSILLECTOMY AND ADENOIDECTOMY    . VAGINAL HYSTERECTOMY     Social History   Occupational History  . machine operator    Social History Main Topics  . Smoking status: Former Research scientist (life sciences)  . Smokeless tobacco: Never Used  . Alcohol use Not on file  . Drug use:  Unknown  . Sexual activity: Not on file

## 2017-06-26 ENCOUNTER — Other Ambulatory Visit (INDEPENDENT_AMBULATORY_CARE_PROVIDER_SITE_OTHER): Payer: Self-pay | Admitting: Orthopaedic Surgery

## 2017-06-26 DIAGNOSIS — M1712 Unilateral primary osteoarthritis, left knee: Secondary | ICD-10-CM

## 2017-06-30 NOTE — Pre-Procedure Instructions (Signed)
Genella Bas  06/30/2017      [id number] [place], [place] - [address] [place] [address] Phone: [phone] Fax: [phone]    Your procedure is scheduled on 07/10/2017.  Report to [facility] Admitting at 1:00 P.M.  Call this number if you have problems the morning of surgery:  [phone]   Remember:  Do not eat food or drink liquids after midnight.  Take these medicines the morning of surgery with A SIP OF WATER: Alprazolam (Xanax) - if needed  7 days prior to surgery STOP taking any Aspirin (unless otherwise instructed by your surgeon), Aleve, Naproxen, Ibuprofen, Motrin, Advil, Goody's, BC's, all herbal medications, fish oil, and all vitamins - including diclofenac (Voltaren)    Do not wear jewelry, make-up or nail polish.  Do not wear lotions, powders, or perfumes, or deodorant.  Do not shave 48 hours prior to surgery.   Do not bring valuables to the hospital.  The Surgery Center is not responsible for any belongings or valuables.  Contacts, eyeglasses, dentures or bridgework may not be worn into surgery.  Leave your suitcase in the car.  After surgery it may be brought to your room.  For patients admitted to the hospital, discharge time will be determined by your treatment team.  Patients discharged the day of surgery will not be allowed to drive home.   Name and phone number of your driver:    Special instructions:   [facility]- Preparing For Surgery  Before surgery, you can play an important role. Because skin is not sterile, your skin needs to be as free of germs as possible. You can reduce the number of germs on your skin by washing with CHG (chlorahexidine gluconate) Soap before surgery.  CHG is an antiseptic cleaner which kills germs and bonds with the skin to continue killing germs even after washing.  Please do not use if you have an allergy to CHG or antibacterial soaps. If your  skin becomes reddened/irritated stop using the CHG.  Do not shave (including legs and underarms) for at least 48 hours prior to first CHG shower. It is OK to shave your face.  Please follow these instructions carefully.   1. Shower the NIGHT BEFORE SURGERY and the MORNING OF SURGERY with CHG.   2. If you chose to wash your hair, wash your hair first as usual with your normal shampoo.  3. After you shampoo, rinse your hair and body thoroughly to remove the shampoo.  4. Use CHG as you would any other liquid soap. You can apply CHG directly to the skin and wash gently with a scrungie or a clean washcloth.   5. Apply the CHG Soap to your body ONLY FROM THE NECK DOWN.  Do not use on open wounds or open sores. Avoid contact with your eyes, ears, mouth and genitals (private parts). Wash Face and genitals (private parts)  with your normal soap.  6. Wash thoroughly, paying special attention to the area where your surgery will be performed.  7. Thoroughly rinse your body with warm water from the neck down.  8. DO NOT shower/wash with your normal soap after using and rinsing off the CHG Soap.  9. Pat yourself dry with a CLEAN TOWEL.  10. Wear CLEAN PAJAMAS to bed the night before surgery, wear comfortable clothes the morning of surgery  11. Place CLEAN SHEETS on your bed the night of your first  shower and DO NOT SLEEP WITH PETS.    Day of Surgery: Shower as stated above. Do not apply any deodorants/lotions. Please wear clean clothes to the hospital/surgery center.      Please read over the following fact sheets that you were given. Pain Booklet, Coughing and Deep Breathing, Total Joint Packet, MRSA Information and Surgical Site Infection Prevention

## 2017-07-01 ENCOUNTER — Other Ambulatory Visit: Payer: Self-pay

## 2017-07-01 ENCOUNTER — Encounter (HOSPITAL_COMMUNITY)
Admission: RE | Admit: 2017-07-01 | Discharge: 2017-07-01 | Disposition: A | Discharge status: Home / Self Care | Payer: BLUE CROSS/BLUE SHIELD | Source: Ambulatory Visit | Attending: Orthopaedic Surgery | Admitting: Orthopaedic Surgery

## 2017-07-01 ENCOUNTER — Encounter (HOSPITAL_COMMUNITY): Payer: Self-pay

## 2017-07-01 DIAGNOSIS — Z87891 Personal history of nicotine dependence: Secondary | ICD-10-CM | POA: Insufficient documentation

## 2017-07-01 DIAGNOSIS — M1712 Unilateral primary osteoarthritis, left knee: Secondary | ICD-10-CM | POA: Insufficient documentation

## 2017-07-01 DIAGNOSIS — Z9049 Acquired absence of other specified parts of digestive tract: Secondary | ICD-10-CM | POA: Diagnosis not present

## 2017-07-01 DIAGNOSIS — Z01812 Encounter for preprocedural laboratory examination: Secondary | ICD-10-CM | POA: Insufficient documentation

## 2017-07-01 DIAGNOSIS — M2242 Chondromalacia patellae, left knee: Secondary | ICD-10-CM | POA: Diagnosis not present

## 2017-07-01 DIAGNOSIS — Z90710 Acquired absence of both cervix and uterus: Secondary | ICD-10-CM | POA: Diagnosis not present

## 2017-07-01 DIAGNOSIS — Z9889 Other specified postprocedural states: Secondary | ICD-10-CM | POA: Insufficient documentation

## 2017-07-01 HISTORY — DX: Anxiety disorder, unspecified: F41.9

## 2017-07-01 HISTORY — DX: Unspecified osteoarthritis, unspecified site: M19.90

## 2017-07-01 HISTORY — DX: Cardiac arrhythmia, unspecified: I49.9

## 2017-07-01 HISTORY — DX: Pneumonia, unspecified organism: J18.9

## 2017-07-01 LAB — COMPREHENSIVE METABOLIC PANEL
ALBUMIN: 3.9 g/dL (ref 3.5–5.0)
ALK PHOS: 137 U/L — AB (ref 38–126)
ALT: 19 U/L (ref 14–54)
AST: 21 U/L (ref 15–41)
Anion gap: 8 (ref 5–15)
BUN: 7 mg/dL (ref 6–20)
CALCIUM: 9.2 mg/dL (ref 8.9–10.3)
CO2: 26 mmol/L (ref 22–32)
CREATININE: 0.61 mg/dL (ref 0.44–1.00)
Chloride: 107 mmol/L (ref 101–111)
GFR calc Af Amer: >60 mL/min (ref >60)
GFR calc non Af Amer: >60 mL/min (ref >60)
GLUCOSE: 91 mg/dL (ref 65–99)
Potassium: 3.9 mmol/L (ref 3.5–5.1)
SODIUM: 141 mmol/L (ref 135–145)
Total Bilirubin: 0.7 mg/dL (ref 0.3–1.2)
Total Protein: 7.3 g/dL (ref 6.5–8.1)

## 2017-07-01 LAB — SURGICAL PCR SCREEN
MRSA, PCR: NEGATIVE
Staphylococcus aureus: POSITIVE — AB

## 2017-07-01 LAB — URINALYSIS, ROUTINE W REFLEX MICROSCOPIC
Bilirubin Urine: NEGATIVE
GLUCOSE, UA: NEGATIVE mg/dL
HGB URINE DIPSTICK: NEGATIVE
KETONES UR: NEGATIVE mg/dL
Nitrite: NEGATIVE
PH: 6 (ref 5.0–8.0)
PROTEIN: NEGATIVE mg/dL
Specific Gravity, Urine: 1.004 — ABNORMAL LOW (ref 1.005–1.030)

## 2017-07-01 LAB — APTT: APTT: 32 s (ref 24–36)

## 2017-07-01 LAB — CBC WITH DIFFERENTIAL/PLATELET
BASOS ABS: 0 10*3/uL (ref 0.0–0.1)
BASOS PCT: 1 %
EOS ABS: 0.2 10*3/uL (ref 0.0–0.7)
EOS PCT: 3 %
HCT: 46.5 % — ABNORMAL HIGH (ref 36.0–46.0)
Hemoglobin: 15.7 g/dL — ABNORMAL HIGH (ref 12.0–15.0)
Lymphocytes Relative: 42 %
Lymphs Abs: 2.5 10*3/uL (ref 0.7–4.0)
MCH: 31.5 pg (ref 26.0–34.0)
MCHC: 33.8 g/dL (ref 30.0–36.0)
MCV: 93.2 fL (ref 78.0–100.0)
MONO ABS: 0.3 10*3/uL (ref 0.1–1.0)
Monocytes Relative: 5 %
Neutro Abs: 3 10*3/uL (ref 1.7–7.7)
Neutrophils Relative %: 49 %
PLATELETS: 219 10*3/uL (ref 150–400)
RBC: 4.99 MIL/uL (ref 3.87–5.11)
RDW: 12.9 % (ref 11.5–15.5)
WBC: 5.9 10*3/uL (ref 4.0–10.5)

## 2017-07-01 LAB — TYPE AND SCREEN
ABO/RH(D): A POS
Antibody Screen: NEGATIVE

## 2017-07-01 LAB — PROTIME-INR
INR: 0.98
Prothrombin Time: 12.9 seconds (ref 11.4–15.2)

## 2017-07-01 LAB — C-REACTIVE PROTEIN: CRP: 1.5 mg/dL — AB (ref <1.0)

## 2017-07-01 LAB — ABO/RH: ABO/RH(D): A POS

## 2017-07-01 LAB — SEDIMENTATION RATE: SED RATE: 31 mm/h — AB (ref 0–22)

## 2017-07-04 ENCOUNTER — Other Ambulatory Visit (INDEPENDENT_AMBULATORY_CARE_PROVIDER_SITE_OTHER): Payer: Self-pay

## 2017-07-09 MED ORDER — TRANEXAMIC ACID 1000 MG/10ML IV SOLN
1000.0000 mg | INTRAVENOUS | Status: AC
  Administered 2017-07-10: 1000 mg via INTRAVENOUS
  Filled 2017-07-09: qty 1100

## 2017-07-10 ENCOUNTER — Inpatient Hospital Stay (HOSPITAL_COMMUNITY): Payer: BLUE CROSS/BLUE SHIELD | Admitting: Anesthesiology

## 2017-07-10 ENCOUNTER — Inpatient Hospital Stay (HOSPITAL_COMMUNITY): Payer: BLUE CROSS/BLUE SHIELD

## 2017-07-10 ENCOUNTER — Encounter (HOSPITAL_COMMUNITY)
Admission: RE | Disposition: A | Discharge status: Home Health | Payer: Self-pay | Source: Ambulatory Visit | Attending: Orthopaedic Surgery

## 2017-07-10 ENCOUNTER — Inpatient Hospital Stay (HOSPITAL_COMMUNITY)
Admission: RE | Admit: 2017-07-10 | Discharge: 2017-07-13 | DRG: 470 | Disposition: A | Discharge status: Home Health | Payer: BLUE CROSS/BLUE SHIELD | Source: Ambulatory Visit | Attending: Orthopaedic Surgery | Admitting: Orthopaedic Surgery

## 2017-07-10 DIAGNOSIS — Z79899 Other long term (current) drug therapy: Secondary | ICD-10-CM

## 2017-07-10 DIAGNOSIS — G8918 Other acute postprocedural pain: Secondary | ICD-10-CM | POA: Diagnosis not present

## 2017-07-10 DIAGNOSIS — M1712 Unilateral primary osteoarthritis, left knee: Secondary | ICD-10-CM | POA: Diagnosis not present

## 2017-07-10 DIAGNOSIS — Z9049 Acquired absence of other specified parts of digestive tract: Secondary | ICD-10-CM

## 2017-07-10 DIAGNOSIS — F17210 Nicotine dependence, cigarettes, uncomplicated: Secondary | ICD-10-CM | POA: Diagnosis present

## 2017-07-10 DIAGNOSIS — F419 Anxiety disorder, unspecified: Secondary | ICD-10-CM | POA: Diagnosis not present

## 2017-07-10 DIAGNOSIS — D62 Acute posthemorrhagic anemia: Secondary | ICD-10-CM | POA: Diagnosis not present

## 2017-07-10 DIAGNOSIS — Z23 Encounter for immunization: Secondary | ICD-10-CM | POA: Diagnosis not present

## 2017-07-10 DIAGNOSIS — Z471 Aftercare following joint replacement surgery: Secondary | ICD-10-CM | POA: Diagnosis not present

## 2017-07-10 DIAGNOSIS — Z96652 Presence of left artificial knee joint: Secondary | ICD-10-CM | POA: Diagnosis not present

## 2017-07-10 DIAGNOSIS — M94262 Chondromalacia, left knee: Secondary | ICD-10-CM | POA: Diagnosis not present

## 2017-07-10 DIAGNOSIS — Z90710 Acquired absence of both cervix and uterus: Secondary | ICD-10-CM

## 2017-07-10 DIAGNOSIS — M25762 Osteophyte, left knee: Secondary | ICD-10-CM | POA: Diagnosis present

## 2017-07-10 DIAGNOSIS — Z96659 Presence of unspecified artificial knee joint: Secondary | ICD-10-CM

## 2017-07-10 HISTORY — PX: TOTAL KNEE ARTHROPLASTY: SHX125

## 2017-07-10 SURGERY — ARTHROPLASTY, KNEE, TOTAL
Anesthesia: Spinal | Site: Knee | Laterality: Left

## 2017-07-10 MED ORDER — TIZANIDINE HCL 4 MG PO TABS: 4.0000 mg | ORAL_TABLET | Freq: Four times a day (QID) | ORAL | 2 refills | Status: DC | PRN

## 2017-07-10 MED ORDER — MENTHOL 3 MG MT LOZG: 1.0000 | LOZENGE | OROMUCOSAL | Status: DC | PRN

## 2017-07-10 MED ORDER — SODIUM CHLORIDE 0.9 % IV SOLN
INTRAVENOUS | Status: DC
  Administered 2017-07-10: 18:00:00 via INTRAVENOUS

## 2017-07-10 MED ORDER — SODIUM CHLORIDE 0.9 % IR SOLN
Status: DC | PRN
  Administered 2017-07-10: 3000 mL

## 2017-07-10 MED ORDER — OXYCODONE HCL ER 15 MG PO T12A
15.0000 mg | EXTENDED_RELEASE_TABLET | Freq: Two times a day (BID) | ORAL | Status: DC
  Administered 2017-07-11 – 2017-07-13 (×5): 15 mg via ORAL
  Filled 2017-07-10 (×5): qty 1

## 2017-07-10 MED ORDER — METHOCARBAMOL 500 MG PO TABS
500.0000 mg | ORAL_TABLET | Freq: Four times a day (QID) | ORAL | Status: DC | PRN
  Administered 2017-07-10 – 2017-07-13 (×5): 500 mg via ORAL
  Filled 2017-07-10 (×4): qty 1

## 2017-07-10 MED ORDER — MORPHINE SULFATE (PF) 2 MG/ML IV SOLN: 1.0000 mg | INTRAVENOUS | Status: DC | PRN

## 2017-07-10 MED ORDER — SODIUM CHLORIDE 0.9 % IJ SOLN
INTRAMUSCULAR | Status: DC | PRN
  Administered 2017-07-10: 40 mL

## 2017-07-10 MED ORDER — PHENOL 1.4 % MT LIQD: 1.0000 | OROMUCOSAL | Status: DC | PRN

## 2017-07-10 MED ORDER — BUPIVACAINE LIPOSOME 1.3 % IJ SUSP
INTRAMUSCULAR | Status: DC | PRN
  Administered 2017-07-10: 20 mL

## 2017-07-10 MED ORDER — ACETAMINOPHEN 650 MG RE SUPP: 650.0000 mg | RECTAL | Status: DC | PRN

## 2017-07-10 MED ORDER — MIDAZOLAM HCL 2 MG/2ML IJ SOLN
2.0000 mg | Freq: Once | INTRAMUSCULAR | Status: AC
  Administered 2017-07-10: 2 mg via INTRAVENOUS

## 2017-07-10 MED ORDER — METHOCARBAMOL 500 MG PO TABS
ORAL_TABLET | ORAL | Status: AC
  Filled 2017-07-10: qty 1

## 2017-07-10 MED ORDER — SORBITOL 70 % SOLN: 30.0000 mL | Freq: Every day | Status: DC | PRN

## 2017-07-10 MED ORDER — ALUM & MAG HYDROXIDE-SIMETH 200-200-20 MG/5ML PO SUSP: 30.0000 mL | ORAL | Status: DC | PRN

## 2017-07-10 MED ORDER — ACETAMINOPHEN 325 MG PO TABS: 650.0000 mg | ORAL_TABLET | ORAL | Status: DC | PRN

## 2017-07-10 MED ORDER — CEFAZOLIN SODIUM-DEXTROSE 2-4 GM/100ML-% IV SOLN
2.0000 g | INTRAVENOUS | Status: AC
  Administered 2017-07-10: 2 g via INTRAVENOUS
  Filled 2017-07-10: qty 100

## 2017-07-10 MED ORDER — METOCLOPRAMIDE HCL 5 MG PO TABS: 5.0000 mg | ORAL_TABLET | Freq: Three times a day (TID) | ORAL | Status: DC | PRN

## 2017-07-10 MED ORDER — OXYCODONE HCL 5 MG PO TABS: 5.0000 mg | ORAL_TABLET | ORAL | 0 refills | Status: DC | PRN

## 2017-07-10 MED ORDER — ONDANSETRON HCL 4 MG/2ML IJ SOLN
4.0000 mg | Freq: Four times a day (QID) | INTRAMUSCULAR | Status: DC | PRN
  Administered 2017-07-10 – 2017-07-11 (×3): 4 mg via INTRAVENOUS
  Filled 2017-07-10 (×3): qty 2

## 2017-07-10 MED ORDER — TRANEXAMIC ACID 1000 MG/10ML IV SOLN
1000.0000 mg | Freq: Once | INTRAVENOUS | Status: AC
  Administered 2017-07-10: 1000 mg via INTRAVENOUS
  Filled 2017-07-10: qty 10

## 2017-07-10 MED ORDER — PROMETHAZINE HCL 25 MG PO TABS: 25.0000 mg | ORAL_TABLET | Freq: Four times a day (QID) | ORAL | 1 refills | Status: DC | PRN

## 2017-07-10 MED ORDER — CHLORHEXIDINE GLUCONATE 4 % EX LIQD: 60.0000 mL | Freq: Once | CUTANEOUS | Status: DC

## 2017-07-10 MED ORDER — VANCOMYCIN HCL 1000 MG IV SOLR
INTRAVENOUS | Status: DC | PRN
  Administered 2017-07-10: 1000 mg via TOPICAL

## 2017-07-10 MED ORDER — CEFAZOLIN SODIUM-DEXTROSE 2-4 GM/100ML-% IV SOLN
2.0000 g | Freq: Four times a day (QID) | INTRAVENOUS | Status: AC
  Administered 2017-07-10 – 2017-07-11 (×2): 2 g via INTRAVENOUS
  Filled 2017-07-10 (×3): qty 100

## 2017-07-10 MED ORDER — METOCLOPRAMIDE HCL 5 MG/ML IJ SOLN
5.0000 mg | Freq: Three times a day (TID) | INTRAMUSCULAR | Status: DC | PRN
  Administered 2017-07-10: 10 mg via INTRAVENOUS
  Filled 2017-07-10: qty 2

## 2017-07-10 MED ORDER — OXYCODONE HCL 5 MG PO TABS: 5.0000 mg | ORAL_TABLET | ORAL | Status: DC | PRN

## 2017-07-10 MED ORDER — HYDROMORPHONE HCL 1 MG/ML IJ SOLN: 0.2500 mg | INTRAMUSCULAR | Status: DC | PRN

## 2017-07-10 MED ORDER — VANCOMYCIN HCL 1000 MG IV SOLR
INTRAVENOUS | Status: AC
  Filled 2017-07-10: qty 1000

## 2017-07-10 MED ORDER — ONDANSETRON HCL 4 MG PO TABS
4.0000 mg | ORAL_TABLET | Freq: Three times a day (TID) | ORAL | 0 refills | Status: DC | PRN
Start: 2017-07-10 — End: 2018-02-23

## 2017-07-10 MED ORDER — MIDAZOLAM HCL 2 MG/2ML IJ SOLN
INTRAMUSCULAR | Status: AC
  Filled 2017-07-10: qty 2

## 2017-07-10 MED ORDER — MELATONIN 3 MG PO TABS
3.0000 mg | ORAL_TABLET | Freq: Every day | ORAL | Status: DC
  Administered 2017-07-11 – 2017-07-12 (×2): 3 mg via ORAL
  Filled 2017-07-10 (×3): qty 1

## 2017-07-10 MED ORDER — MAGNESIUM CITRATE PO SOLN: 1.0000 | Freq: Once | ORAL | Status: DC | PRN

## 2017-07-10 MED ORDER — LACTATED RINGERS IV SOLN
INTRAVENOUS | Status: DC
  Administered 2017-07-10: 10 mL/h via INTRAVENOUS
  Administered 2017-07-10: 15:00:00 via INTRAVENOUS

## 2017-07-10 MED ORDER — MORPHINE SULFATE (PF) 4 MG/ML IV SOLN
1.0000 mg | INTRAVENOUS | Status: DC | PRN
  Administered 2017-07-10 – 2017-07-12 (×4): 1 mg via INTRAVENOUS
  Filled 2017-07-10 (×4): qty 1

## 2017-07-10 MED ORDER — ROPIVACAINE HCL 5 MG/ML IJ SOLN
INTRAMUSCULAR | Status: DC | PRN
  Administered 2017-07-10: 20 mL via PERINEURAL

## 2017-07-10 MED ORDER — PROPOFOL 10 MG/ML IV BOLUS
INTRAVENOUS | Status: AC
  Filled 2017-07-10: qty 20

## 2017-07-10 MED ORDER — OXYCODONE HCL 5 MG PO TABS
10.0000 mg | ORAL_TABLET | ORAL | Status: DC | PRN
  Administered 2017-07-10 – 2017-07-11 (×4): 10 mg via ORAL
  Filled 2017-07-10 (×3): qty 2

## 2017-07-10 MED ORDER — FENTANYL CITRATE (PF) 250 MCG/5ML IJ SOLN
INTRAMUSCULAR | Status: DC | PRN
  Administered 2017-07-10: 50 ug via INTRAVENOUS

## 2017-07-10 MED ORDER — ACETAMINOPHEN 500 MG PO TABS
1000.0000 mg | ORAL_TABLET | Freq: Four times a day (QID) | ORAL | Status: AC
  Administered 2017-07-10 – 2017-07-11 (×2): 1000 mg via ORAL
  Filled 2017-07-10 (×2): qty 2

## 2017-07-10 MED ORDER — MIDAZOLAM HCL 5 MG/5ML IJ SOLN
INTRAMUSCULAR | Status: DC | PRN
  Administered 2017-07-10 (×2): 1 mg via INTRAVENOUS

## 2017-07-10 MED ORDER — FENTANYL CITRATE (PF) 100 MCG/2ML IJ SOLN
INTRAMUSCULAR | Status: AC
  Filled 2017-07-10: qty 2

## 2017-07-10 MED ORDER — LIDOCAINE HCL (CARDIAC) 20 MG/ML IV SOLN
INTRAVENOUS | Status: DC | PRN
  Administered 2017-07-10: 40 mg via INTRATRACHEAL

## 2017-07-10 MED ORDER — DEXAMETHASONE SODIUM PHOSPHATE 10 MG/ML IJ SOLN
10.0000 mg | Freq: Once | INTRAMUSCULAR | Status: AC
  Administered 2017-07-11: 10 mg via INTRAVENOUS
  Filled 2017-07-10: qty 1

## 2017-07-10 MED ORDER — OXYCODONE HCL 5 MG PO TABS: 5.0000 mg | ORAL_TABLET | Freq: Once | ORAL | Status: DC | PRN

## 2017-07-10 MED ORDER — ALPRAZOLAM 0.25 MG PO TABS: 0.2500 mg | ORAL_TABLET | Freq: Every day | ORAL | Status: DC | PRN

## 2017-07-10 MED ORDER — PROPOFOL 500 MG/50ML IV EMUL
INTRAVENOUS | Status: DC | PRN
  Administered 2017-07-10: 50 ug/kg/min via INTRAVENOUS

## 2017-07-10 MED ORDER — OXYCODONE HCL ER 10 MG PO T12A: 10.0000 mg | EXTENDED_RELEASE_TABLET | Freq: Two times a day (BID) | ORAL | 0 refills | Status: DC

## 2017-07-10 MED ORDER — SENNOSIDES-DOCUSATE SODIUM 8.6-50 MG PO TABS: 1.0000 | ORAL_TABLET | Freq: Every evening | ORAL | 1 refills | Status: DC | PRN

## 2017-07-10 MED ORDER — ASPIRIN EC 325 MG PO TBEC
325.0000 mg | DELAYED_RELEASE_TABLET | Freq: Two times a day (BID) | ORAL | Status: DC
  Administered 2017-07-11 – 2017-07-13 (×5): 325 mg via ORAL
  Filled 2017-07-10 (×5): qty 1

## 2017-07-10 MED ORDER — TRANEXAMIC ACID 1000 MG/10ML IV SOLN
2000.0000 mg | INTRAVENOUS | Status: AC
  Administered 2017-07-10: 2000 mg via TOPICAL
  Filled 2017-07-10: qty 20

## 2017-07-10 MED ORDER — BUPIVACAINE LIPOSOME 1.3 % IJ SUSP
20.0000 mL | INTRAMUSCULAR | Status: DC
Start: 2017-07-10 — End: 2017-07-10
  Filled 2017-07-10: qty 20

## 2017-07-10 MED ORDER — BUPIVACAINE IN DEXTROSE 0.75-8.25 % IT SOLN
INTRATHECAL | Status: DC | PRN
  Administered 2017-07-10: 1.6 mL via INTRATHECAL

## 2017-07-10 MED ORDER — OXYCODONE HCL 5 MG PO TABS
ORAL_TABLET | ORAL | Status: AC
  Filled 2017-07-10: qty 2

## 2017-07-10 MED ORDER — ONDANSETRON HCL 4 MG PO TABS: 4.0000 mg | ORAL_TABLET | Freq: Four times a day (QID) | ORAL | Status: DC | PRN

## 2017-07-10 MED ORDER — POLYETHYLENE GLYCOL 3350 17 G PO PACK
17.0000 g | PACK | Freq: Every day | ORAL | Status: DC | PRN
  Administered 2017-07-12: 17 g via ORAL
  Filled 2017-07-10: qty 1

## 2017-07-10 MED ORDER — ASPIRIN EC 325 MG PO TBEC
325.0000 mg | DELAYED_RELEASE_TABLET | Freq: Two times a day (BID) | ORAL | 0 refills | Status: DC
Start: 2017-07-10 — End: 2018-02-23

## 2017-07-10 MED ORDER — METHOCARBAMOL 1000 MG/10ML IJ SOLN
500.0000 mg | Freq: Four times a day (QID) | INTRAVENOUS | Status: DC | PRN
  Filled 2017-07-10 (×2): qty 5

## 2017-07-10 MED ORDER — OXYCODONE HCL 5 MG/5ML PO SOLN: 5.0000 mg | Freq: Once | ORAL | Status: DC | PRN

## 2017-07-10 MED ORDER — PHENYLEPHRINE HCL 10 MG/ML IJ SOLN
INTRAMUSCULAR | Status: DC | PRN
  Administered 2017-07-10: 80 ug via INTRAVENOUS
  Administered 2017-07-10: 40 ug via INTRAVENOUS
  Administered 2017-07-10 (×2): 60 ug via INTRAVENOUS

## 2017-07-10 MED ORDER — PROMETHAZINE HCL 25 MG/ML IJ SOLN: 6.2500 mg | INTRAMUSCULAR | Status: DC | PRN

## 2017-07-10 MED ORDER — FENTANYL CITRATE (PF) 100 MCG/2ML IJ SOLN
100.0000 ug | Freq: Once | INTRAMUSCULAR | Status: AC
  Administered 2017-07-10: 100 ug via INTRAVENOUS

## 2017-07-10 MED ORDER — FENTANYL CITRATE (PF) 250 MCG/5ML IJ SOLN
INTRAMUSCULAR | Status: AC
  Filled 2017-07-10: qty 5

## 2017-07-10 MED ORDER — 0.9 % SODIUM CHLORIDE (POUR BTL) OPTIME
TOPICAL | Status: DC | PRN
  Administered 2017-07-10: 1000 mL

## 2017-07-10 MED ORDER — KETOROLAC TROMETHAMINE 15 MG/ML IJ SOLN
30.0000 mg | Freq: Four times a day (QID) | INTRAMUSCULAR | Status: AC
  Administered 2017-07-10 – 2017-07-11 (×3): 30 mg via INTRAVENOUS
  Filled 2017-07-10 (×3): qty 2

## 2017-07-10 MED ORDER — DIPHENHYDRAMINE HCL 12.5 MG/5ML PO ELIX: 25.0000 mg | ORAL_SOLUTION | ORAL | Status: DC | PRN

## 2017-07-10 SURGICAL SUPPLY — 69 items
ALCOHOL ISOPROPYL (RUBBING) (MISCELLANEOUS) ×2 IMPLANT
APL SKNCLS STERI-STRIP NONHPOA (GAUZE/BANDAGES/DRESSINGS)
BAG DECANTER FOR FLEXI CONT (MISCELLANEOUS) ×2 IMPLANT
BANDAGE ACE 6X5 VEL STRL LF (GAUZE/BANDAGES/DRESSINGS) ×2 IMPLANT
BANDAGE ESMARK 6X9 LF (GAUZE/BANDAGES/DRESSINGS) ×1 IMPLANT
BENZOIN TINCTURE PRP APPL 2/3 (GAUZE/BANDAGES/DRESSINGS) ×1 IMPLANT
BLADE SAW SGTL 13.0X1.19X90.0M (BLADE) ×2 IMPLANT
BNDG CMPR 9X6 STRL LF SNTH (GAUZE/BANDAGES/DRESSINGS) ×1
BNDG CMPR MED 10X6 ELC LF (GAUZE/BANDAGES/DRESSINGS) ×1
BNDG ELASTIC 6X10 VLCR STRL LF (GAUZE/BANDAGES/DRESSINGS) ×1 IMPLANT
BNDG ESMARK 6X9 LF (GAUZE/BANDAGES/DRESSINGS) ×2
BOWL SMART MIX CTS (DISPOSABLE) ×1 IMPLANT
CAPT KNEE TRIATH TK-4 ×1 IMPLANT
CLSR STERI-STRIP ANTIMIC 1/2X4 (GAUZE/BANDAGES/DRESSINGS) ×5 IMPLANT
COVER SURGICAL LIGHT HANDLE (MISCELLANEOUS) ×2 IMPLANT
CUFF TOURNIQUET SINGLE 34IN LL (TOURNIQUET CUFF) ×2 IMPLANT
CUFF TOURNIQUET SINGLE 44IN (TOURNIQUET CUFF) IMPLANT
DRAPE EXTREMITY T 121X128X90 (DRAPE) ×2 IMPLANT
DRAPE HALF SHEET 40X57 (DRAPES) ×2 IMPLANT
DRAPE INCISE IOBAN 66X45 STRL (DRAPES) IMPLANT
DRAPE ORTHO SPLIT 77X108 STRL (DRAPES) ×4
DRAPE SURG 17X11 SM STRL (DRAPES) ×4 IMPLANT
DRAPE SURG ORHT 6 SPLT 77X108 (DRAPES) ×2 IMPLANT
DRSG AQUACEL AG ADV 3.5X10 (GAUZE/BANDAGES/DRESSINGS) ×2 IMPLANT
DRSG AQUACEL AG ADV 3.5X14 (GAUZE/BANDAGES/DRESSINGS) ×2 IMPLANT
DURAPREP 26ML APPLICATOR (WOUND CARE) ×5 IMPLANT
ELECT CAUTERY BLADE 6.4 (BLADE) ×2 IMPLANT
ELECT REM PT RETURN 9FT ADLT (ELECTROSURGICAL) ×2
ELECTRODE REM PT RTRN 9FT ADLT (ELECTROSURGICAL) ×1 IMPLANT
GLOVE SKINSENSE NS SZ7.5 (GLOVE) ×1
GLOVE SKINSENSE STRL SZ7.5 (GLOVE) ×1 IMPLANT
GLOVE SURG SYN 7.5  E (GLOVE) ×4
GLOVE SURG SYN 7.5 E (GLOVE) ×4 IMPLANT
GLOVE SURG SYN 7.5 PF PI (GLOVE) ×4 IMPLANT
GOWN STRL REIN XL XLG (GOWN DISPOSABLE) ×2 IMPLANT
GOWN STRL REUS W/ TWL LRG LVL3 (GOWN DISPOSABLE) ×1 IMPLANT
GOWN STRL REUS W/TWL LRG LVL3 (GOWN DISPOSABLE) ×2
HANDPIECE INTERPULSE COAX TIP (DISPOSABLE) ×2
HOOD PEEL AWAY FLYTE STAYCOOL (MISCELLANEOUS) ×5 IMPLANT
KIT BASIN OR (CUSTOM PROCEDURE TRAY) ×2 IMPLANT
KIT ROOM TURNOVER OR (KITS) ×2 IMPLANT
MANIFOLD NEPTUNE II (INSTRUMENTS) ×2 IMPLANT
MARKER SKIN DUAL TIP RULER LAB (MISCELLANEOUS) ×2 IMPLANT
NDL SPNL 18GX3.5 QUINCKE PK (NEEDLE) ×1 IMPLANT
NEEDLE SPNL 18GX3.5 QUINCKE PK (NEEDLE) ×2 IMPLANT
NS IRRIG 1000ML POUR BTL (IV SOLUTION) ×2 IMPLANT
PACK TOTAL JOINT (CUSTOM PROCEDURE TRAY) ×2 IMPLANT
PAD ARMBOARD 7.5X6 YLW CONV (MISCELLANEOUS) ×4 IMPLANT
SAW OSC TIP CART 19.5X105X1.3 (SAW) ×2 IMPLANT
SET HNDPC FAN SPRY TIP SCT (DISPOSABLE) ×1 IMPLANT
STAPLER VISISTAT 35W (STAPLE) IMPLANT
STRIP CLOSURE SKIN 1/2X4 (GAUZE/BANDAGES/DRESSINGS) IMPLANT
SUCTION FRAZIER HANDLE 10FR (MISCELLANEOUS) ×1
SUCTION TUBE FRAZIER 10FR DISP (MISCELLANEOUS) ×1 IMPLANT
SUT ETHILON 2 0 FS 18 (SUTURE) IMPLANT
SUT MNCRL AB 3-0 PS2 18 (SUTURE) ×2 IMPLANT
SUT MNCRL AB 4-0 PS2 18 (SUTURE) IMPLANT
SUT VIC AB 0 CT1 27 (SUTURE) ×4
SUT VIC AB 0 CT1 27XBRD ANBCTR (SUTURE) ×2 IMPLANT
SUT VIC AB 1 CTX 27 (SUTURE) ×6 IMPLANT
SUT VIC AB 2-0 CT1 27 (SUTURE) ×6
SUT VIC AB 2-0 CT1 TAPERPNT 27 (SUTURE) ×3 IMPLANT
SYR 50ML LL SCALE MARK (SYRINGE) ×2 IMPLANT
TOWEL OR 17X24 6PK STRL BLUE (TOWEL DISPOSABLE) ×2 IMPLANT
TOWEL OR 17X26 10 PK STRL BLUE (TOWEL DISPOSABLE) ×2 IMPLANT
TRAY CATH 16FR W/PLASTIC CATH (SET/KITS/TRAYS/PACK) IMPLANT
TRAY FOLEY CATH SILVER 16FR (SET/KITS/TRAYS/PACK) ×1 IMPLANT
UNDERPAD 30X30 (UNDERPADS AND DIAPERS) ×2 IMPLANT
WRAP KNEE MAXI GEL POST OP (GAUZE/BANDAGES/DRESSINGS) ×2 IMPLANT

## 2017-07-10 NOTE — Discharge Instructions (Signed)

## 2017-07-10 NOTE — Anesthesia Procedure Notes (Signed)
Spinal  Patient location during procedure: OR Start time: 07/10/2017 1:58 PM End time: 07/10/2017 2:03 PM Staffing Anesthesiologist: [name], MD Performed: anesthesiologist  Preanesthetic Checklist Completed: patient identified, site marked, surgical consent, pre-op evaluation, timeout performed, IV checked, risks and benefits discussed and monitors and equipment checked Spinal Block Patient position: sitting Prep: Betadine Patient monitoring: heart rate, cardiac monitor, continuous pulse ox and blood pressure Approach: midline Location: L3-4 Injection technique: single-shot Needle Needle type: Quincke  Needle gauge: 22 G Needle length: 9 cm

## 2017-07-10 NOTE — H&P (Signed)
PREOPERATIVE H&P  Chief Complaint: left knee degenerative joint disease  HPI: [name] is a 56 y.o. female who presents for surgical treatment of left knee degenerative joint disease.  She denies any changes in medical history.  Past Medical History:  Diagnosis Date  . Anxiety   . Arthritis   . Dysrhythmia    hx of afib, afib ablation in 01/2014, no afib since then  . Pneumonia    as a child   Past Surgical History:  Procedure Laterality Date  . ABDOMINAL HYSTERECTOMY    . BLADDER SURGERY  2000  . CARDIAC ELECTROPHYSIOLOGY MAPPING AND ABLATION     for afib  . CESAREAN SECTION    . CHOLECYSTECTOMY    . fallopian tube removal    . loop recorder explant  2015  . LOOP RECORDER IMPLANT  2015  . TONSILLECTOMY AND ADENOIDECTOMY    . VAGINAL HYSTERECTOMY     Social History   Socioeconomic History  . Marital status: Married    Spouse name: Not on file  . Number of children: Not on file  . Years of education: Not on file  . Highest education level: Not on file  Social Needs  . Financial resource strain: Not on file  . Food insecurity - worry: Not on file  . Food insecurity - inability: Not on file  . Transportation needs - medical: Not on file  . Transportation needs - non-medical: Not on file  Occupational History  . Occupation: Glass blower/designer  Tobacco Use  . Smoking status: Current Every Day Smoker    Packs/day: 0.50    Types: Cigarettes  . Smokeless tobacco: Never Used  Substance and Sexual Activity  . Alcohol use: No    Frequency: Never  . Drug use: No  . Sexual activity: Yes    Birth control/protection: Surgical  Other Topics Concern  . Not on file  Social History Narrative  . Not on file   No family history on file. No Known Allergies Prior to Admission medications   Medication Sig Start Date End Date Taking? Authorizing Provider  ALPRAZolam Duanne Moron) 0.5 MG tablet Take 0.25 mg by mouth daily as needed for anxiety.   Yes [provider]   ibuprofen (ADVIL,MOTRIN) 200 MG tablet Take 400 mg by mouth every 8 (eight) hours as needed for mild pain or moderate pain.   Yes [provider]  Melatonin 3 MG TABS Take 3 mg by mouth at bedtime.   Yes [provider]  diclofenac (VOLTAREN) 75 MG EC tablet Take 1 tablet (75 mg total) by mouth 2 (two) times daily. Patient not taking: Reported on 05/04/2017 03/17/17   Leandrew Koyanagi, MD     Positive ROS: All other systems have been reviewed and were otherwise negative with the exception of those mentioned in the HPI and as above.  Physical Exam: General: Alert, no acute distress Cardiovascular: No pedal edema Respiratory: No cyanosis, no use of accessory musculature GI: abdomen soft Skin: No lesions in the area of chief complaint Neurologic: Sensation intact distally Psychiatric: Patient is competent for consent with normal mood and affect Lymphatic: no lymphedema  MUSCULOSKELETAL: exam stable  Assessment: left knee degenerative joint disease  Plan: Plan for Procedure(s): LEFT TOTAL KNEE ARTHROPLASTY  The risks benefits and alternatives were discussed with the patient including but not limited to the risks of nonoperative treatment, versus surgical intervention including infection, bleeding, nerve injury,  blood clots, cardiopulmonary complications, morbidity, mortality, among others, and  they were willing to proceed.   Eduard Roux, MD   07/10/2017 12:07 PM

## 2017-07-10 NOTE — Anesthesia Procedure Notes (Signed)
Anesthesia Regional Block: Adductor canal block   Pre-Anesthetic Checklist: ,, timeout performed, Correct Patient, Correct Site, Correct Laterality, Correct Procedure, Correct Position, site marked, Risks and benefits discussed,  Surgical consent,  Pre-op evaluation,  At surgeon's request and post-op pain management  Laterality: Left  Prep: chloraprep       Needles:  Injection technique: Single-shot  Needle Type: Stimiplex     Needle Length: 9cm  Needle Gauge: 21     Additional Needles:   Procedures:,,,, ultrasound used (permanent image in chart),,,,  Narrative:  Start time: 07/10/2017 1:35 PM End time: 07/10/2017 1:40 PM Injection made incrementally with aspirations every 5 mL.  Performed by: Personally  Anesthesiologist: [name], MD

## 2017-07-10 NOTE — Anesthesia Postprocedure Evaluation (Signed)
Anesthesia Post Note  Patient: [name]  Procedure(s) Performed: LEFT TOTAL KNEE ARTHROPLASTY (Left Knee)     Patient location during evaluation: PACU Anesthesia Type: Spinal Level of consciousness: awake and alert Pain management: pain level controlled Vital Signs Assessment: post-procedure vital signs reviewed and stable Respiratory status: spontaneous breathing, nonlabored ventilation, respiratory function stable and patient connected to nasal cannula oxygen Cardiovascular status: blood pressure returned to baseline and stable Postop Assessment: no apparent nausea or vomiting Anesthetic complications: no    Last Vitals:  Vitals:   07/10/17 1755 07/10/17 1925  BP: 130/76 (!) 146/85  Pulse: 65 64  Resp: 16 19  Temp: 36.6 C (!) 36.4 C  SpO2: 99% 100%    Last Pain:  Vitals:   07/10/17 1925  TempSrc: Axillary  PainSc:                  [name]

## 2017-07-10 NOTE — Op Note (Signed)
Total Knee Arthroplasty Procedure Note  Preoperative diagnosis: Left knee osteoarthritis  Postoperative diagnosis:same  Operative procedure: Left total knee arthroplasty. CPT [id number]  Surgeon: N. Eduard Roux, MD  Assistants: Ky Barban, RNFA  Anesthesia: Spinal, regional  Tourniquet time: 45 mins  Implants used: Stryker Triatholon Femur: 5 PS Tibia: 6 Patella: 29 mm, 9 thick Polyethylene: 9 mm  Indication: [name] is a 56 y.o. year old female with a history of knee pain. Having failed conservative management, the patient elected to proceed with a total knee arthroplasty.  We have reviewed the risk and benefits of the surgery and they elected to proceed after voicing understanding.  Procedure:  After informed consent was obtained and understanding of the risk were voiced including but not limited to bleeding, infection, damage to surrounding structures including nerves and vessels, blood clots, leg length inequality and the failure to achieve desired results, the operative extremity was marked with verbal confirmation of the patient in the holding area.   The patient was then brought to the operating room and transported to the operating room table in the supine position.  A tourniquet was applied to the operative extremity around the upper thigh. The operative limb was then prepped and draped in the usual sterile fashion and preoperative antibiotics were administered.  A time out was performed prior to the start of surgery confirming the correct extremity, preoperative antibiotic administration, as well as team members, implants and instruments available for the case. Correct surgical site was also confirmed with preoperative radiographs. The limb was then elevated for exsanguination and the tourniquet was inflated. A midline incision was made and a standard medial parapatellar approach was performed.  The patella was prepared and sized to a 29 mm.  A cover was placed on the  patella for protection from retractors.  We then turned our attention to the femur. Posterior cruciate ligament was sacrificed. Start site was drilled in the femur and the intramedullary distal femoral cutting guide was placed, set at 5 degrees valgus, taking 9 mm of distal resection. The distal cut was made. Osteophytes were then removed. Next, the proximal tibial cutting guide was placed with appropriate slope, varus/valgus alignment and depth of resection. The proximal tibial cut was made. Gap blocks were then used to assess the extension gap and alignment, and appropriate soft tissue releases were performed. Attention was turned back to the femur, which was sized using the sizing guide to a size 5. Appropriate rotation of the femoral component was determined using epicondylar axis, Whiteside's line, and assessing the flexion gap under ligament tension. The appropriate size 4-in-1 cutting block was placed and cuts were made. Posterior femoral osteophytes and uncapped bone were then removed with the curved osteotome. The tibia was sized for a size 6 component. The femoral box-cutting guide was placed and prepared for a PS femoral component. Trial components were placed, and stability was checked in full extension, mid-flexion, and deep flexion. Proper tibial rotation was determined and marked.  The patella tracked well without a lateral release. Trial components were then removed and tibial preparation performed. A posterior capsular injection comprising of 20 cc of 1.3% exparel and 40 cc of normal saline was performed for postoperative pain control. The bony surfaces were irrigated with a pulse lavage and then dried. The final components sized above were malleted into place. The stability of the construct was re-evaluated throughout a range of motion and found to be acceptable. The trial liner was removed, the knee was copiously irrigated,  and the knee was re-evaluated for any excess bone debris. The real  polyethylene liner, 9 mm thick, was inserted and checked to ensure the locking mechanism had engaged appropriately. The tourniquet was deflated and hemostasis was achieved. The wound was irrigated with normal saline. A drain was not placed. Capsular closure was performed with a #1 vicryl, subcutaneous fat closed with a 0 vicryl suture, then subcutaneous tissue closed with interrupted 2.0 vicryl suture. The skin was then closed with a 3.0 monocryl. A sterile dressing was applied.   The patient was awakened in the operating room and taken to recovery in stable condition. All sponge, needle, and instrument counts were correct at the end of the case.  Position: supine  Complications: none.  Time Out: performed   Drains/Packing: none  Estimated blood loss: minimal  Returned to Recovery Room: in good condition.   Antibiotics: yes   Mechanical VTE (DVT) Prophylaxis: sequential compression devices, TED thigh-high  Chemical VTE (DVT) Prophylaxis: aspirin  Fluid Replacement  Crystalloid: see anesthesia record Blood: none  FFP: none   Specimens Removed: 1 to pathology   Sponge and Instrument Count Correct? yes   PACU: portable radiograph - knee AP and Lateral   Admission: inpatient status  Plan/RTC: Return in 2 weeks for wound check.   Weight Bearing/Load Lower Extremity: full   N. Eduard Roux, MD [place] 3:54 PM

## 2017-07-10 NOTE — Anesthesia Preprocedure Evaluation (Signed)
Anesthesia Evaluation  Patient identified by MRN, date of birth, ID band Patient awake    Reviewed: Allergy & Precautions, NPO status , Patient's Chart, lab work & pertinent test results  Airway Mallampati: II  TM Distance: >3 FB Neck ROM: Full    Dental no notable dental hx.    Pulmonary neg pulmonary ROS, Current Smoker,    Pulmonary exam normal breath sounds clear to auscultation       Cardiovascular negative cardio ROS Normal cardiovascular exam Rhythm:Regular Rate:Normal     Neuro/Psych Anxiety negative neurological ROS  negative psych ROS   GI/Hepatic negative GI ROS, Neg liver ROS,   Endo/Other  negative endocrine ROS  Renal/GU negative Renal ROS  negative genitourinary   Musculoskeletal negative musculoskeletal ROS (+) Arthritis , Osteoarthritis,    Abdominal   Peds negative pediatric ROS (+)  Hematology negative hematology ROS (+)   Anesthesia Other Findings   Reproductive/Obstetrics negative OB ROS                             Anesthesia Physical Anesthesia Plan  ASA: II  Anesthesia Plan: Spinal   Post-op Pain Management:  Regional for Post-op pain   Induction: Intravenous  PONV Risk Score and Plan: 1 and Ondansetron  Airway Management Planned: Simple Face Mask  Additional Equipment:   Intra-op Plan:   Post-operative Plan:   Informed Consent: I have reviewed the patients History and Physical, chart, labs and discussed the procedure including the risks, benefits and alternatives for the proposed anesthesia with the patient or authorized representative who has indicated his/her understanding and acceptance.   Dental advisory given  Plan Discussed with: CRNA  Anesthesia Plan Comments:         Anesthesia Quick Evaluation

## 2017-07-10 NOTE — Progress Notes (Signed)
Orthopedic Tech Progress Note Patient Details:  [name] [birth year] [id number]  CPM Left Knee CPM Left Knee: On Left Knee Flexion (Degrees): 50 Left Knee Extension (Degrees): 0   Braulio Bosch 07/10/2017, 6:58 PM

## 2017-07-10 NOTE — Transfer of Care (Signed)
Immediate Anesthesia Transfer of Care Note  Patient: [name]  Procedure(s) Performed: LEFT TOTAL KNEE ARTHROPLASTY (Left Knee)  Patient Location: PACU  Anesthesia Type:Regional and Spinal  Level of Consciousness: awake, alert  and oriented  Airway & Oxygen Therapy: Patient Spontanous Breathing and Patient connected to nasal cannula oxygen  Post-op Assessment: Report given to RN and Post -op Vital signs reviewed and stable  Post vital signs: Reviewed and stable  Last Vitals:  Vitals:   07/10/17 1153 07/10/17 1340  BP: (!) 144/99 131/73  Pulse: 70   Resp: 20   Temp: 36.5 C   SpO2: 99%     Last Pain:  Vitals:   07/10/17 1340  TempSrc:   PainSc: 2       Patients Stated Pain Goal: 3 (21/62/44 6950)  Complications: No apparent anesthesia complications

## 2017-07-11 ENCOUNTER — Encounter (HOSPITAL_COMMUNITY): Payer: Self-pay | Admitting: Orthopaedic Surgery

## 2017-07-11 LAB — CBC
HEMATOCRIT: 38.2 % (ref 36.0–46.0)
HEMOGLOBIN: 12.8 g/dL (ref 12.0–15.0)
MCH: 31.4 pg (ref 26.0–34.0)
MCHC: 33.5 g/dL (ref 30.0–36.0)
MCV: 93.6 fL (ref 78.0–100.0)
Platelets: 152 10*3/uL (ref 150–400)
RBC: 4.08 MIL/uL (ref 3.87–5.11)
RDW: 12.6 % (ref 11.5–15.5)
WBC: 6.3 10*3/uL (ref 4.0–10.5)

## 2017-07-11 LAB — BASIC METABOLIC PANEL
Anion gap: 6 (ref 5–15)
BUN: 9 mg/dL (ref 6–20)
CHLORIDE: 106 mmol/L (ref 101–111)
CO2: 27 mmol/L (ref 22–32)
CREATININE: 0.62 mg/dL (ref 0.44–1.00)
Calcium: 8.4 mg/dL — ABNORMAL LOW (ref 8.9–10.3)
GFR calc Af Amer: >60 mL/min (ref >60)
GFR calc non Af Amer: >60 mL/min (ref >60)
Glucose, Bld: 87 mg/dL (ref 65–99)
POTASSIUM: 3.8 mmol/L (ref 3.5–5.1)
SODIUM: 139 mmol/L (ref 135–145)

## 2017-07-11 NOTE — Care Management Note (Signed)
Case Management Note  Patient Details  Name: [name] MRN: [id number] Date of Birth: June 03, 1961  Subjective/Objective:   56 yr old female s/p left total knee arthroplasty.                  Action/Plan: Case manager spoke with patient and her husband concerning discharge plan and DME.  Patient was preoperatively setup with [facility], no changes. She has RW and 3in1 from previous surgery. Patient will have support from husband and family at discharge.   Expected Discharge Date:   07/12/17               Expected Discharge Plan:  [place]  In-House Referral:     Discharge planning Services  CM Consult  Post Acute Care Choice:  NA Choice offered to:  Patient  DME Arranged:  N/A(Has RW and 3in1) DME Agency:  NA  HH Arranged:  PT [place] Agency:  [facility] (formerly [employer])  Status of Service:  Completed, signed off  If discussed at [employer], dates discussed:    Additional Comments:  Ninfa Meeker, RN 07/11/2017, 10:26 AM

## 2017-07-11 NOTE — Progress Notes (Signed)
Physical Therapy Treatment Patient Details Name: [name] MRN: [id number] DOB: [birth year] Today's Date: 07/11/2017    History of Present Illness 56 y.o. female s/p L TKA  PMH: Anxiety, Dysrhythmia    PT Comments    PM session focused on progressing therex and ambulation. Pt demonstrating  improvement in bed mobility and transfers, and an increase in activity tolerance however is still limited by pain and nausea. Next session should focus on platform stair training to enter home upon d/c, and improving gait mechanics.     Follow Up Recommendations  Home health PT;DC plan and follow up therapy as arranged by surgeon     Equipment Recommendations  None recommended by PT    Recommendations for Other Services       Precautions / Restrictions Precautions Precautions: Fall;Knee Precaution Booklet Issued: Yes (comment) Restrictions Weight Bearing Restrictions: Yes LLE Weight Bearing: Weight bearing as tolerated    Mobility  Bed Mobility Overal bed mobility: Needs Assistance Bed Mobility: Supine to Sit     Supine to sit: Supervision     General bed mobility comments: Supervison, pt able to lift LLE out of bed with assistance now  Transfers Overall transfer level: Needs assistance Equipment used: Rolling walker (2 wheeled) Transfers: Sit to/from Stand Sit to Stand: Min guard         General transfer comment: sit<>stand with min guarding, cues for hand placement  Ambulation/Gait Ambulation/Gait assistance: Min guard Ambulation Distance (Feet): 50 Feet Assistive device: Rolling walker (2 wheeled) Gait Pattern/deviations: Step-to pattern;Antalgic;Decreased stance time - left     General Gait Details: slow and painful, cues for sequencing with RW and safety with transfers    Stairs            Wheelchair Mobility    Modified Rankin (Stroke Patients Only)       Balance Overall balance assessment: Needs assistance Sitting-balance support: Feet  unsupported;No upper extremity supported Sitting balance-Leahy Scale: Normal     Standing balance support: During functional activity;Bilateral upper extremity supported Standing balance-Leahy Scale: Poor Standing balance comment: cannot tolerate static standing without UE support at this time due to post op pain. no balance deficts at baseline.                             Cognition Arousal/Alertness: Awake/alert Behavior During Therapy: WFL for tasks assessed/performed                                   General Comments: Anxious about pain       Exercises Total Joint Exercises Ankle Circles/Pumps: AROM;Both;20 reps Quad Sets: AROM;Left;10 reps Short Arc Quad: AROM;Both;10 reps Knee Flexion: AROM;10 reps;Left Goniometric ROM: 80    General Comments General comments (skin integrity, edema, etc.): VSS throughout session, pt reports nausea, notified RN.       Pertinent Vitals/Pain Pain Assessment: Faces Faces Pain Scale: Hurts whole lot Pain Descriptors / Indicators: Operative site guarding;Grimacing;Discomfort;Aching;Burning Pain Intervention(s): Limited activity within patient's tolerance;Monitored during session;Premedicated before session    Home Living                      Prior Function            PT Goals (current goals can now be found in the care plan section) Acute Rehab PT Goals Patient Stated Goal: decrease pain, return home with HHPT  PT Goal Formulation: With patient/family Time For Goal Achievement: 07/15/17 Potential to Achieve Goals: Good    Frequency    7X/week      PT Plan Current plan remains appropriate    Co-evaluation              AM-PAC PT "6 Clicks" Daily Activity  Outcome Measure  Difficulty turning over in bed (including adjusting bedclothes, sheets and blankets)?: A Lot Difficulty moving from lying on back to sitting on the side of the bed? : A Lot Difficulty sitting down on and standing  up from a chair with arms (e.g., wheelchair, bedside commode, etc,.)?: A Little Help needed moving to and from a bed to chair (including a wheelchair)?: A Little Help needed walking in hospital room?: A Little Help needed climbing 3-5 steps with a railing? : A Lot 6 Click Score: 15    End of Session Equipment Utilized During Treatment: Gait belt Activity Tolerance: Patient limited by pain Patient left: in chair;with call bell/phone within reach;with family/visitor present Nurse Communication: Mobility status;Patient requests pain meds PT Visit Diagnosis: Unsteadiness on feet (R26.81);Other abnormalities of gait and mobility (R26.89);Pain;Muscle weakness (generalized) (M62.81) Pain - part of body: Knee     Time: 3536-1443 PT Time Calculation (min) (ACUTE ONLY): 28 min  Charges:  $Gait Training: 8-22 mins $Therapeutic Exercise: 8-22 mins                    G Codes:       Reinaldo Berber, PT, DPT Acute Rehab Services Pager: [phone]     Reinaldo Berber 07/11/2017, 3:10 PM

## 2017-07-11 NOTE — Progress Notes (Signed)
   Subjective:  Patient reports pain as moderate.    Objective:   VITALS:   Vitals:   07/10/17 1755 07/10/17 1925 07/11/17 0100 07/11/17 0341  BP: 130/76 (!) 146/85 108/60 121/69  Pulse: 65 64 65 68  Resp: 16 19 14 16   Temp: 97.9 F (36.6 C) (!) 97.5 F (36.4 C) 97.8 F (36.6 C) 98.4 F (36.9 C)  TempSrc: Oral Axillary Oral Oral  SpO2: 99% 100% 92% 98%    Neurologically intact Neurovascular intact Sensation intact distally Intact pulses distally Dorsiflexion/Plantar flexion intact Incision: dressing C/D/I and no drainage No cellulitis present Compartment soft   Lab Results  Component Value Date   WBC 6.3 07/11/2017   HGB 12.8 07/11/2017   HCT 38.2 07/11/2017   MCV 93.6 07/11/2017   PLT 152 07/11/2017     Assessment/Plan:  1 Day Post-Op   - Expected postop acute blood loss anemia - will monitor for symptoms - Up with PT/OT - DVT ppx - SCDs, ambulation, aspirin - WBAT operative extremity - Pain control - Discharge planning - likely home with HHPT  Eduard Roux 07/11/2017, 7:25 AM [phone]

## 2017-07-11 NOTE — Evaluation (Signed)
Occupational Therapy Evaluation Patient Details Name: [name] MRN: [id number] DOB: 03/09/1961 Today's Date: 07/11/2017    History of Present Illness 56 y.o. female s/p L TKA  PMH: Anxiety, Dysrhythmia   Clinical Impression   This 56 y/o F presents with the above. At baseline Pt is independent with ADLs and functional mobility. Pt currently requires MinGuard-MinA for functional mobility at RW level; [place] for LB ADLs, limited mostly by pain in LLE and fatigue this session. Pt will benefit from continued acute OT services to increase safety and independence with ADLs and mobility prior to return home. Pt plans to return home with 24 hr assist from spouse.     Follow Up Recommendations  DC plan and follow up therapy as arranged by surgeon;Supervision/Assistance - 24 hour    Equipment Recommendations  None recommended by OT           Precautions / Restrictions Precautions Precautions: Fall;Knee Precaution Booklet Issued: Yes (comment) Precaution Comments: Reviewed verbally. no pillow under knee.  Restrictions Weight Bearing Restrictions: Yes LLE Weight Bearing: Weight bearing as tolerated      Mobility Bed Mobility Overal bed mobility: Needs Assistance Bed Mobility: Supine to Sit;Sit to Supine     Supine to sit: Supervision Sit to supine: Min assist   General bed mobility comments: supervision when coming to EOB, light minA for LLE when returning to supine in bed, educated on technqiues for assisting LLE back into bed   Transfers Overall transfer level: Needs assistance Equipment used: Rolling walker (2 wheeled) Transfers: Sit to/from Stand Sit to Stand: Min guard         General transfer comment: sit<>stand with min guarding, cues for hand placement    Balance Overall balance assessment: Needs assistance Sitting-balance support: Feet unsupported;No upper extremity supported Sitting balance-Leahy Scale: Normal     Standing balance support: During  functional activity;Bilateral upper extremity supported;No upper extremity supported Standing balance-Leahy Scale: Fair Standing balance comment: maintains brief static standing at sink to wash hands; reliant on UE support during mobility                            ADL either performed or assessed with clinical judgement   ADL Overall ADL's : Needs assistance/impaired Eating/Feeding: Independent;Sitting   Grooming: Wash/dry hands;Min guard;Standing   Upper Body Bathing: Min guard;Sitting   Lower Body Bathing: Min guard;Sit to/from stand   Upper Body Dressing : Sitting;Set up   Lower Body Dressing: Minimal assistance;Sit to/from stand;Bed level Lower Body Dressing Details (indicate cue type and reason): Pt able to get sock over L foot while sitting in bed, requires assist to fully don over heel  Toilet Transfer: Minimal assistance;Ambulation;Comfort height toilet;RW;Grab bars   Toileting- Clothing Manipulation and Hygiene: Min guard;Sit to/from stand       Functional mobility during ADLs: Min guard;Minimal assistance;Rolling walker General ADL Comments: educated on compensatory techniques for completing ADLs                          Pertinent Vitals/Pain Pain Assessment: Faces Faces Pain Scale: Hurts even more Pain Location: L knee  Pain Descriptors / Indicators: Operative site guarding;Grimacing;Discomfort;Aching;Burning Pain Intervention(s): Limited activity within patient's tolerance;Monitored during session;Ice applied          Extremity/Trunk Assessment Upper Extremity Assessment Upper Extremity Assessment: Overall WFL for tasks assessed   Lower Extremity Assessment Lower Extremity Assessment: Defer to PT evaluation   Cervical /  Trunk Assessment Cervical / Trunk Assessment: Normal   Communication Communication Communication: No difficulties   Cognition Arousal/Alertness: Awake/alert Behavior During Therapy: WFL for tasks  assessed/performed Overall Cognitive Status: Within Functional Limits for tasks assessed                                 General Comments: Anxious about pain    General Comments  spouse present during session               Home Living Family/patient expects to be discharged to:: Private residence Living Arrangements: Spouse/significant other;Other relatives Available Help at Discharge: Family;Available 24 hours/day Type of Home: House Home Access: Stairs to enter [employer] of Steps: 1 Entrance Stairs-Rails: Can reach both Home Layout: One level     Bathroom Shower/Tub: Occupational psychologist: Standard     Home Equipment: Walker - 2 wheels;Grab bars - tub/shower;Grab bars - toilet;Toilet riser;Shower seat          Prior Functioning/Environment Level of Independence: Independent        Comments: Mod I with all mobility         OT Problem List: Decreased strength;Impaired balance (sitting and/or standing);Pain;Decreased range of motion;Decreased knowledge of use of DME or AE;Decreased activity tolerance      OT Treatment/Interventions: Self-care/ADL training;DME and/or AE instruction;Therapeutic activities;Balance training;Therapeutic exercise;Energy conservation;Patient/family education    OT Goals(Current goals can be found in the care plan section) Acute Rehab OT Goals Patient Stated Goal: decrease pain, return home with HHPT OT Goal Formulation: With patient Time For Goal Achievement: 07/25/17 Potential to Achieve Goals: Good  OT Frequency: Min 2X/week                             AM-PAC PT "6 Clicks" Daily Activity     Outcome Measure Help from another person eating meals?: None Help from another person taking care of personal grooming?: A Little Help from another person toileting, which includes using toliet, bedpan, or urinal?: A Little Help from another person bathing (including washing, rinsing,  drying)?: A Little Help from another person to put on and taking off regular upper body clothing?: None Help from another person to put on and taking off regular lower body clothing?: A Lot 6 Click Score: 19   End of Session Equipment Utilized During Treatment: Gait belt;Rolling walker Nurse Communication: Mobility status  Activity Tolerance: Patient tolerated treatment well;Patient limited by pain Patient left: in bed;with call bell/phone within reach  OT Visit Diagnosis: Other abnormalities of gait and mobility (R26.89);Unsteadiness on feet (R26.81);Pain Pain - Right/Left: Left Pain - part of body: Knee                Time: 9892-1194 OT Time Calculation (min): 22 min Charges:  OT General Charges $OT Visit: 1 Visit OT Evaluation $OT Eval Low Complexity: 1 Low G-Codes:     Lou Cal, OT Pager [phone] 07/11/2017   Raymondo Band 07/11/2017, 5:30 PM

## 2017-07-11 NOTE — Evaluation (Signed)
Physical Therapy Evaluation Patient Details Name: [name] MRN: [id number] DOB: 1961/03/08 Today's Date: 07/11/2017   History of Present Illness  56 y.o. female s/p L TKA  PMH: Anxiety, Dysrhythmia  Clinical Impression  Patient is s/p above surgery resulting in functional limitations due to the deficits listed below (see PT Problem List). PTA, pt was mod I with all mobility and ambulating. Today pt is limited by extreme post op pain and weakness in LLE. Currently min A with bed mobility, min guard with slow ambulation and use of RW. Session focused on training with AD and OOB mobility. Will progress activity and therex next visit.  Patient will benefit from skilled PT to increase their independence and safety with mobility to allow discharge to the venue listed below.       Follow Up Recommendations Home health PT;DC plan and follow up therapy as arranged by surgeon    Equipment Recommendations  None recommended by PT    Recommendations for Other Services       Precautions / Restrictions Precautions Precautions: Fall;Knee Precaution Booklet Issued: Yes (comment) Precaution Comments: Reviewed verbally. no pillow under knee.  Restrictions Weight Bearing Restrictions: Yes LLE Weight Bearing: Weight bearing as tolerated      Mobility  Bed Mobility Overal bed mobility: Needs Assistance Bed Mobility: Supine to Sit     Supine to sit: Min assist     General bed mobility comments: Min A with LLE due to pain currently  Transfers Overall transfer level: Needs assistance Equipment used: Rolling walker (2 wheeled) Transfers: Sit to/from Stand Sit to Stand: Min guard         General transfer comment: sit<>stand with min guarding, cues for hand placement  Ambulation/Gait Ambulation/Gait assistance: Min guard Ambulation Distance (Feet): 10 Feet Assistive device: Rolling walker (2 wheeled) Gait Pattern/deviations: Step-to pattern;Antalgic;Decreased stance time - left      General Gait Details: slow and painful, cues for sequencing with RW and safety with transfers   Stairs            Wheelchair Mobility    Modified Rankin (Stroke Patients Only)       Balance Overall balance assessment: Needs assistance Sitting-balance support: Feet unsupported;No upper extremity supported Sitting balance-Leahy Scale: Normal     Standing balance support: During functional activity;Bilateral upper extremity supported Standing balance-Leahy Scale: Poor Standing balance comment: cannt tolerate static standing without UE support at this time due to post op pain. no balance deficts at baseline.                              Pertinent Vitals/Pain Pain Assessment: 0-10 Pain Score: 9  Pain Descriptors / Indicators: Operative site guarding;Grimacing;Discomfort;Aching;Burning Pain Intervention(s): Limited activity within patient's tolerance;Monitored during session    [place] expects to be discharged to:: Private residence Living Arrangements: Spouse/significant other;Other relatives Available Help at Discharge: Family;Available 24 hours/day Type of Home: House Home Access: Stairs to enter Entrance Stairs-Rails: Can reach both Entrance Stairs-Number of Steps: 1 Home Layout: One level Home Equipment: Walker - 2 wheels;Grab bars - tub/shower;Grab bars - toilet;Toilet riser;Shower seat      Prior Function Level of Independence: Independent         Comments: Mod I with all mobility      Hand Dominance        Extremity/Trunk Assessment   Upper Extremity Assessment Upper Extremity Assessment: Defer to OT evaluation;Overall [facility] for tasks assessed  Lower Extremity Assessment Lower Extremity Assessment: (LLE 3-/5 due to post op pain, RLE 4/5, light touch WNL BL)    Cervical / Trunk Assessment Cervical / Trunk Assessment: Normal  Communication   Communication: No difficulties  Cognition Arousal/Alertness:  Awake/alert Behavior During Therapy: WFL for tasks assessed/performed                                   General Comments: Anxious about pain       General Comments General comments (skin integrity, edema, etc.): VSS througouht session. Husband present throughout session.     Exercises Total Joint Exercises Ankle Circles/Pumps: AROM;Both;20 reps Quad Sets: AROM;Left;10 reps   Assessment/Plan    PT Assessment Patient needs continued PT services  PT Problem List Decreased strength;Decreased range of motion;Decreased activity tolerance;Decreased balance;Decreased mobility;Decreased knowledge of use of DME;Pain       PT Treatment Interventions Gait training;DME instruction;Stair training;Functional mobility training;Therapeutic activities;Therapeutic exercise    PT Goals (Current goals can be found in the Care Plan section)  Acute Rehab PT Goals Patient Stated Goal: decrease pain, return home with HHPT PT Goal Formulation: With patient/family Time For Goal Achievement: 07/15/17 Potential to Achieve Goals: Good    Frequency 7X/week   Barriers to discharge        Co-evaluation               AM-PAC PT "6 Clicks" Daily Activity  Outcome Measure Difficulty turning over in bed (including adjusting bedclothes, sheets and blankets)?: Unable Difficulty moving from lying on back to sitting on the side of the bed? : Unable Difficulty sitting down on and standing up from a chair with arms (e.g., wheelchair, bedside commode, etc,.)?: A Little Help needed moving to and from a bed to chair (including a wheelchair)?: A Little Help needed walking in hospital room?: A Little Help needed climbing 3-5 steps with a railing? : A Lot 6 Click Score: 13    End of Session Equipment Utilized During Treatment: Gait belt Activity Tolerance: Patient limited by pain Patient left: in chair;with call bell/phone within reach;with family/visitor present Nurse Communication: Mobility  status;Patient requests pain meds PT Visit Diagnosis: Unsteadiness on feet (R26.81);Other abnormalities of gait and mobility (R26.89);Pain;Muscle weakness (generalized) (M62.81) Pain - part of body: Knee    Time: 0830-0859 PT Time Calculation (min) (ACUTE ONLY): 29 min   Charges:   PT Evaluation $PT Eval Low Complexity: 1 Low PT Treatments $Gait Training: 8-22 mins   PT G Codes:        Reinaldo Berber, PT, DPT Acute Rehab Services Pager: [phone]    Reinaldo Berber 07/11/2017, 9:24 AM

## 2017-07-12 ENCOUNTER — Other Ambulatory Visit: Payer: Self-pay

## 2017-07-12 ENCOUNTER — Encounter (HOSPITAL_COMMUNITY): Payer: Self-pay | Admitting: Orthopedic Surgery

## 2017-07-12 MED ORDER — OXYCODONE HCL 5 MG PO TABS
10.0000 mg | ORAL_TABLET | ORAL | Status: DC
  Administered 2017-07-12 – 2017-07-13 (×5): 10 mg via ORAL
  Filled 2017-07-12 (×5): qty 2

## 2017-07-12 MED ORDER — INFLUENZA VAC SPLIT QUAD 0.5 ML IM SUSY
0.5000 mL | PREFILLED_SYRINGE | INTRAMUSCULAR | Status: AC
  Administered 2017-07-13: 0.5 mL via INTRAMUSCULAR
  Filled 2017-07-12: qty 0.5

## 2017-07-12 MED ORDER — OXYCODONE HCL 5 MG PO TABS: 5.0000 mg | ORAL_TABLET | ORAL | Status: DC | PRN

## 2017-07-12 NOTE — Progress Notes (Signed)
Orthopedic Tech Progress Note Patient Details:  [name] 10-08-1960 [id number]  CPM Left Knee CPM Left Knee: On Left Knee Flexion (Degrees): 50 Left Knee Extension (Degrees): 0   [place] Pink 07/12/2017, 3:59 PM

## 2017-07-12 NOTE — Plan of Care (Signed)
  Activity: Risk for activity intolerance will decrease 07/12/2017 1458 - Progressing by Dorene Sorrow, RN   Nutrition: Adequate nutrition will be maintained 07/12/2017 1458 - Progressing by Dorene Sorrow, RN   Pain Managment: General experience of comfort will improve 07/12/2017 1458 - Progressing by Dorene Sorrow, RN

## 2017-07-12 NOTE — Progress Notes (Signed)
Occupational Therapy Treatment Patient Details Name: [name] MRN: [id number] DOB: [birth year] Today's Date: 07/12/2017    History of present illness 56 y.o. female s/p L TKA  PMH: Anxiety, Dysrhythmia   OT comments  Education provided in session. Feel pt will continue to benefit from acute OT to increase independence prior to d/c.   Follow Up Recommendations  DC plan and follow up therapy as arranged by surgeon;Supervision - Intermittent    Equipment Recommendations  None recommended by OT    Recommendations for Other Services      Precautions / Restrictions Precautions Precautions: Fall;Knee Precaution Booklet Issued: No Restrictions Weight Bearing Restrictions: Yes LLE Weight Bearing: Weight bearing as tolerated       Mobility Bed Mobility Overal bed mobility: Needs Assistance Bed Mobility: Supine to Sit     Supine to sit: HOB elevated;Min assist     General bed mobility comments: assisted with LLE. Explained she could use sheet to assist LLE.  Transfers Overall transfer level: Needs assistance Equipment used: Rolling walker (2 wheeled) Transfers: Sit to/from Stand Sit to Stand: Min assist         General transfer comment: Min guard for sit to stand transfer. Min A to manage LLE when going from stand to sit.    Balance      No loss of balance in session-used RW for support.                                      ADL either performed or assessed with clinical judgement   ADL Overall ADL's : Needs assistance/impaired                     Lower Body Dressing: Total assistance;Sitting/lateral leans(sitting edge of bed; OT assisted with sock ) Lower Body Dressing Details (indicate cue type and reason): pt reported she can do it on bed Toilet Transfer: Minimal assistance;Ambulation;RW Toilet Transfer Details (indicate cue type and reason): assisted with stand to sit to chair         Functional mobility during ADLs: Minimal  assistance;Rolling walker(Min A-stand to sit transfer; otherwise, Min guard) General ADL Comments: Educated on LB dressing technique. Educated on shower transfer technique. Educated on AE-sounds as if pt will have spouse assist. Educated on safety such use of bag on walker, rugs, safe footwear.      Vision       Perception     Praxis      Cognition Arousal/Alertness: Awake/alert Behavior During Therapy: WFL for tasks assessed/performed Overall Cognitive Status: Within Functional Limits for tasks assessed                                          Exercises     Shoulder Instructions       General Comments      Pertinent Vitals/ Pain       Pain Assessment: 0-10 Pain Score: (5-6) Pain Location: left knee Pain Descriptors / Indicators: Burning("hurts") Pain Intervention(s): Monitored during session;Repositioned;Limited activity within patient's tolerance  Home Living                                          Prior Functioning/Environment  Frequency  Min 2X/week        Progress Toward Goals  OT Goals(current goals can now be found in the care plan section)  Progress towards OT goals: Progressing toward goals-verbalized understanding of shower transfer technique.  Acute Rehab OT Goals Patient Stated Goal: not stated OT Goal Formulation: With patient Time For Goal Achievement: 07/25/17 Potential to Achieve Goals: Good ADL Goals Pt Will Perform Grooming: with modified independence;standing Pt Will Perform Lower Body Dressing: with min guard assist;sit to/from stand Pt Will Transfer to Toilet: with modified independence;ambulating;bedside commode(BSC over toilet) Pt Will Perform Toileting - Clothing Manipulation and hygiene: with modified independence;sit to/from stand Pt Will Perform Tub/Shower Transfer: Shower transfer;with supervision;ambulating;shower seat;rolling walker  Plan Discharge plan needs to be updated     Co-evaluation                 AM-PAC PT "6 Clicks" Daily Activity     Outcome Measure   Help from another person eating meals?: None Help from another person taking care of personal grooming?: A Little Help from another person toileting, which includes using toliet, bedpan, or urinal?: A Little Help from another person bathing (including washing, rinsing, drying)?: A Little Help from another person to put on and taking off regular upper body clothing?: A Little Help from another person to put on and taking off regular lower body clothing?: A Lot 6 Click Score: 18    End of Session Equipment Utilized During Treatment: Gait belt;Rolling walker  OT Visit Diagnosis: Pain Pain - Right/Left: Left Pain - part of body: Knee   Activity Tolerance Patient limited by pain   Patient Left in chair;with call bell/phone within reach   Nurse Communication          Time: 1505-6979 OT Time Calculation (min): 18 min  Charges: OT General Charges $OT Visit: 1 Visit OT Treatments $Self Care/Home Management : 8-22 mins    Linley Moskal L [place] OTR/L 07/12/2017, 8:11 AM

## 2017-07-12 NOTE — Progress Notes (Signed)
07/12/17  1330  Patient states her lt leg feels heavy and she is not able to move it. MD paged . Per MD this is a normal feeling after surgery. Once pt continues with PT the feeling should start to decrease. Pt informed of what MD said. Pt up with husband and walker walking in room.

## 2017-07-12 NOTE — Progress Notes (Signed)
Physical Therapy Treatment Patient Details Name: [name] MRN: [id number] DOB: 1961-04-04 Today's Date: 07/12/2017    History of Present Illness 56 y.o. female s/p L TKA  PMH: Anxiety, Dysrhythmia    PT Comments    Patient was able to complete HEP with max cues and assistance. Family present. Pt needs to progress gait next session and complete stair training prior to d/c. Continue to progress as tolerated with anticipated d/c home with HHPT.     Follow Up Recommendations  Home health PT;DC plan and follow up therapy as arranged by surgeon     Equipment Recommendations  None recommended by PT    Recommendations for Other Services       Precautions / Restrictions Precautions Precautions: Fall;Knee Precaution Comments: positioning/precautions reviewed with pt Restrictions Weight Bearing Restrictions: Yes LLE Weight Bearing: Weight bearing as tolerated    Mobility  Bed Mobility Overal bed mobility: Needs Assistance Bed Mobility: Supine to Sit;Sit to Supine     Supine to sit: Min assist Sit to supine: Min assist   General bed mobility comments: assis to mobilize L LE; husband assisted to bring L LE into bed; cues for technique  Transfers Overall transfer level: Needs assistance Equipment used: Rolling walker (2 wheeled) Transfers: Sit to/from Stand Sit to Stand: Min assist         General transfer comment: cues for safe hand placement and technique; assist to power up into standing  Ambulation/Gait             General Gait Details: pt took side steps toward [facility]   Stairs            Wheelchair Mobility    Modified Rankin (Stroke Patients Only)       Balance Overall balance assessment: Needs assistance Sitting-balance support: Feet supported;Single extremity supported Sitting balance-Leahy Scale: Good     Standing balance support: Bilateral upper extremity supported Standing balance-Leahy Scale: Poor                               Cognition Arousal/Alertness: Awake/alert Behavior During Therapy: WFL for tasks assessed/performed Overall Cognitive Status: Within Functional Limits for tasks assessed                                 General Comments: Anxious about pain       Exercises Total Joint Exercises Ankle Circles/Pumps: AROM;Both;10 reps Quad Sets: AROM;Left;10 reps Short Arc QuadSinclair Ship;Left;10 reps Heel Slides: AAROM;Left;10 reps Hip ABduction/ADduction: AROM;Left;10 reps Straight Leg Raises: AAROM;Left;5 reps Long Arc Quad: AAROM;Left;Seated;10 reps Knee Flexion: AROM;Left;5 reps;Seated;Other (comment)(10 sec holds)    General Comments General comments (skin integrity, edema, etc.): husband, son, and grandchildren present end of session      Pertinent Vitals/Pain Pain Assessment: Faces Faces Pain Scale: Hurts even more Pain Location: left knee Pain Descriptors / Indicators: Grimacing;Guarding;Sore("hurts") Pain Intervention(s): Limited activity within patient's tolerance;Monitored during session;Premedicated before session;Repositioned    Home Living                      Prior Function            PT Goals (current goals can now be found in the care plan section) Acute Rehab PT Goals PT Goal Formulation: With patient/family Time For Goal Achievement: 07/15/17 Potential to Achieve Goals: Good Progress towards PT goals: Progressing toward goals  Frequency    7X/week      PT Plan Current plan remains appropriate    Co-evaluation              AM-PAC PT "6 Clicks" Daily Activity  Outcome Measure  Difficulty turning over in bed (including adjusting bedclothes, sheets and blankets)?: Unable Difficulty moving from lying on back to sitting on the side of the bed? : Unable Difficulty sitting down on and standing up from a chair with arms (e.g., wheelchair, bedside commode, etc,.)?: Unable Help needed moving to and from a bed to chair (including a  wheelchair)?: A Little Help needed walking in hospital room?: A Little Help needed climbing 3-5 steps with a railing? : A Lot 6 Click Score: 11    End of Session   Activity Tolerance: Patient tolerated treatment well Patient left: with call bell/phone within reach;with family/visitor present;in bed Nurse Communication: Mobility status;Patient requests pain meds PT Visit Diagnosis: Unsteadiness on feet (R26.81);Other abnormalities of gait and mobility (R26.89);Pain;Muscle weakness (generalized) (M62.81) Pain - part of body: Knee     Time: 4081-4481 PT Time Calculation (min) (ACUTE ONLY): 32 min  Charges:  $Therapeutic Exercise: 8-22 mins $Therapeutic Activity: 8-22 mins                    G Codes:       Earney Navy, PTA Pager: [phone]     Darliss Cheney 07/12/2017, 3:33 PM

## 2017-07-12 NOTE — Progress Notes (Signed)
Subjective: Patient stable but having a lot of pain.  Not really ready for discharge this morning.  Working with physical therapy at this time   Objective: Vital signs in last 24 hours: Temp:  [98.1 F (36.7 C)-98.7 F (37.1 C)] 98.3 F (36.8 C) (11/17 0557) Pulse Rate:  [57-63] 63 (11/17 0557) Resp:  [18] 18 (11/16 1300) BP: (120-139)/(65-75) 139/71 (11/17 0557) SpO2:  [86 %-96 %] 96 % (11/17 0557)  Intake/Output from previous day: 11/16 0701 - 11/17 0700 In: 250 [P.O.:250] Out: -  Intake/Output this shift: No intake/output data recorded.  Exam:  Dorsiflexion/Plantar flexion intact  Labs: Recent Labs    07/11/17 0418  HGB 12.8   Recent Labs    07/11/17 0418  WBC 6.3  RBC 4.08  HCT 38.2  PLT 152   Recent Labs    07/11/17 0418  NA 139  K 3.8  CL 106  CO2 27  BUN 9  CREATININE 0.62  GLUCOSE 87  CALCIUM 8.4*   No results for input(s): LABPT, INR in the last 72 hours.  Assessment/Plan: Plan is therapy plus pain medicine to be taken on a scheduled basis.  We will check again in the morning and she may be ready at that time   Anderson Malta 07/12/2017, 9:05 AM

## 2017-07-12 NOTE — Progress Notes (Signed)
Physical Therapy Treatment Patient Details Name: [name] MRN: [id number] DOB: 01-22-1961 Today's Date: 07/12/2017    History of Present Illness 56 y.o. female s/p L TKA  PMH: Anxiety, Dysrhythmia    PT Comments    Patient limited by pain this am. Pt tolerated short distance gait with min guard/min A and RW. Continue to progress as tolerated.    Follow Up Recommendations  Home health PT;DC plan and follow up therapy as arranged by surgeon     Equipment Recommendations  None recommended by PT    Recommendations for Other Services       Precautions / Restrictions Precautions Precautions: Fall;Knee Precaution Booklet Issued: No Precaution Comments: positioning/precautions reviewe with pt Restrictions Weight Bearing Restrictions: Yes LLE Weight Bearing: Weight bearing as tolerated    Mobility  Bed Mobility Overal bed mobility: Needs Assistance Bed Mobility: Sit to Supine     Supine to sit: HOB elevated;Min assist Sit to supine: Min assist   General bed mobility comments: assist to bring L LE into bed; cues for sequencing and technique  Transfers Overall transfer level: Needs assistance Equipment used: Rolling walker (2 wheeled) Transfers: Sit to/from Stand Sit to Stand: Min guard         General transfer comment: min guard for safety; cues for hand placement  Ambulation/Gait Ambulation/Gait assistance: Min guard Ambulation Distance (Feet): 16 Feet Assistive device: Rolling walker (2 wheeled) Gait Pattern/deviations: Step-to pattern;Antalgic;Decreased stance time - left;Decreased step length - right;Decreased weight shift to left Gait velocity: decreased   General Gait Details: cues for sequencing, posture, and increased weight bearing on L LE; pt unable to clear R foot    Stairs            Wheelchair Mobility    Modified Rankin (Stroke Patients Only)       Balance Overall balance assessment: Needs assistance Sitting-balance support:  Feet supported;Single extremity supported Sitting balance-Leahy Scale: Good     Standing balance support: Bilateral upper extremity supported Standing balance-Leahy Scale: Fair                              Cognition Arousal/Alertness: Awake/alert Behavior During Therapy: WFL for tasks assessed/performed Overall Cognitive Status: Within Functional Limits for tasks assessed                                 General Comments: Anxious about pain       Exercises      General Comments General comments (skin integrity, edema, etc.): husband present      Pertinent Vitals/Pain Pain Assessment: Faces Pain Score: (5-6) Faces Pain Scale: Hurts whole lot Pain Location: left knee Pain Descriptors / Indicators: Grimacing;Guarding;Crying("hurts") Pain Intervention(s): Limited activity within patient's tolerance;Monitored during session;Premedicated before session;Repositioned;Patient requesting pain meds-RN notified    Home Living                      Prior Function            PT Goals (current goals can now be found in the care plan section) Acute Rehab PT Goals Patient Stated Goal: not stated PT Goal Formulation: With patient/family Time For Goal Achievement: 07/15/17 Potential to Achieve Goals: Good Progress towards PT goals: Not progressing toward goals - comment(limited by pain)    Frequency    7X/week      PT Plan Current plan  remains appropriate    Co-evaluation              AM-PAC PT "6 Clicks" Daily Activity  Outcome Measure  Difficulty turning over in bed (including adjusting bedclothes, sheets and blankets)?: A Lot Difficulty moving from lying on back to sitting on the side of the bed? : A Lot Difficulty sitting down on and standing up from a chair with arms (e.g., wheelchair, bedside commode, etc,.)?: A Little Help needed moving to and from a bed to chair (including a wheelchair)?: A Little Help needed walking in  hospital room?: A Little Help needed climbing 3-5 steps with a railing? : A Lot 6 Click Score: 15    End of Session Equipment Utilized During Treatment: Gait belt Activity Tolerance: Patient limited by pain Patient left: with call bell/phone within reach;with family/visitor present;in bed Nurse Communication: Mobility status;Patient requests pain meds PT Visit Diagnosis: Unsteadiness on feet (R26.81);Other abnormalities of gait and mobility (R26.89);Pain;Muscle weakness (generalized) (M62.81) Pain - part of body: Knee     Time: 7948-0165 PT Time Calculation (min) (ACUTE ONLY): 19 min  Charges:  $Gait Training: 8-22 mins                    G Codes:       Earney Navy, PTA Pager: [phone]     Darliss Cheney 07/12/2017, 10:04 AM

## 2017-07-13 NOTE — Progress Notes (Signed)
Subjective: Patient stable.  Pain control.  Plan discharge today.  Patient desires home CPM and I called the office to have him check with Dr. Sherrian Divers to see if that can be arranged tomorrow   Objective: Vital signs in last 24 hours: Temp:  [98.4 F (36.9 C)-98.9 F (37.2 C)] 98.4 F (36.9 C) (11/18 0400) Pulse Rate:  [77-88] 88 (11/18 0400) Resp:  [16-18] 16 (11/18 0400) BP: (108-148)/(59-83) 148/83 (11/18 0400) SpO2:  [94 %-96 %] 94 % (11/18 0400) Weight:  [230 lb (104.3 kg)] 230 lb (104.3 kg) (11/17 2323)  Intake/Output from previous day: 11/17 0701 - 11/18 0700 In: 520 [P.O.:520] Out: 500 [Urine:500] Intake/Output this shift: No intake/output data recorded.  Exam:  Sensation intact distally  Labs: Recent Labs    07/11/17 0418  HGB 12.8   Recent Labs    07/11/17 0418  WBC 6.3  RBC 4.08  HCT 38.2  PLT 152   Recent Labs    07/11/17 0418  NA 139  K 3.8  CL 106  CO2 27  BUN 9  CREATININE 0.62  GLUCOSE 87  CALCIUM 8.4*   No results for input(s): LABPT, INR in the last 72 hours.  Assessment/Plan: Plan discharge today   Anderson Malta 07/13/2017, 9:54 AM

## 2017-07-13 NOTE — Progress Notes (Signed)
Physical Therapy Treatment Patient Details Name: [name] MRN: [id number] DOB: [birth year] Today's Date: 07/13/2017    History of Present Illness 56 y.o. female s/p L TKA  PMH: Anxiety, Dysrhythmia    PT Comments    Continuing work on functional mobility and activity tolerance;  Noting improvements in gait distance and pattern, and [name] is more comfortable with L single limb stance; questions solicited and answered; OK for dc home from PT standpoint    Follow Up Recommendations  Home health PT;DC plan and follow up therapy as arranged by surgeon     Equipment Recommendations  None recommended by PT    Recommendations for Other Services       Precautions / Restrictions Precautions Precautions: Fall;Knee Precaution Comments: positioning/precautions reviewed with pt Restrictions Weight Bearing Restrictions: Yes LLE Weight Bearing: Weight bearing as tolerated    Mobility  Bed Mobility               General bed mobility comments: reviewed very high bed, set bed height as indicated pt. states she does not want to attempt getting into a really high bed and will either sleep in recliner or other bedroom they have  Transfers Overall transfer level: Needs assistance Equipment used: Rolling walker (2 wheeled) Transfers: Sit to/from Stand Sit to Stand: Min guard Stand pivot transfers: Min guard       General transfer comment: Cues for hand lacement and safety  Ambulation/Gait Ambulation/Gait assistance: Min guard Ambulation Distance (Feet): 80 Feet Assistive device: Rolling walker (2 wheeled) Gait Pattern/deviations: Decreased stance time - left;Decreased step length - right(emerging step-through pattern) Gait velocity: decreased   General Gait Details: Initial pre-gait activities focused on getting [name] comfortable with putting weight onto LLE, with repetitions of L single limb stance with UE support from RW; then able to walk to gym where we worked on  icreasing step height bilaterally in prep for stepping up threshold into home   Stairs            Wheelchair Mobility    Modified Rankin (Stroke Patients Only)       Balance     Sitting balance-Leahy Scale: Good       Standing balance-Leahy Scale: Poor                              Cognition Arousal/Alertness: Awake/alert Behavior During Therapy: WFL for tasks assessed/performed Overall Cognitive Status: Within Functional Limits for tasks assessed                                 General Comments: Anxious about pain       Exercises      General Comments        Pertinent Vitals/Pain Pain Assessment: 0-10 Pain Score: 5  Pain Location: left knee Pain Descriptors / Indicators: Grimacing;Guarding;Sore Pain Intervention(s): Monitored during session;Premedicated before session    Home Living                      Prior Function            PT Goals (current goals can now be found in the care plan section) Acute Rehab PT Goals Patient Stated Goal: not stated PT Goal Formulation: With patient/family Time For Goal Achievement: 07/15/17 Potential to Achieve Goals: Good Progress towards PT goals: Progressing toward goals    Frequency  7X/week      PT Plan Current plan remains appropriate    Co-evaluation              AM-PAC PT "6 Clicks" Daily Activity  Outcome Measure  Difficulty turning over in bed (including adjusting bedclothes, sheets and blankets)?: A Lot Difficulty moving from lying on back to sitting on the side of the bed? : A Lot Difficulty sitting down on and standing up from a chair with arms (e.g., wheelchair, bedside commode, etc,.)?: A Lot Help needed moving to and from a bed to chair (including a wheelchair)?: A Little Help needed walking in hospital room?: A Little Help needed climbing 3-5 steps with a railing? : A Little 6 Click Score: 15    End of Session Equipment Utilized During  Treatment: Gait belt Activity Tolerance: Patient tolerated treatment well Patient left: in chair;with call bell/phone within reach;with family/visitor present Nurse Communication: Mobility status PT Visit Diagnosis: Unsteadiness on feet (R26.81);Other abnormalities of gait and mobility (R26.89);Pain;Muscle weakness (generalized) (M62.81) Pain - Right/Left: Left Pain - part of body: Knee     Time: 2841-3244 PT Time Calculation (min) (ACUTE ONLY): 24 min  Charges:  $Gait Training: 23-37 mins                    G Codes:       Roney Marion, PT  Acute Rehabilitation Services Pager [phone] Office [phone]    Colletta Maryland 07/13/2017, 11:56 AM

## 2017-07-13 NOTE — Progress Notes (Signed)
Occupational Therapy Treatment Patient Details Name: [name] MRN: [id number] DOB: [birth year] Today's Date: 07/13/2017    History of present illness 56 y.o. female s/p L TKA  PMH: Anxiety, Dysrhythmia   OT comments  Pt. Able to complete toileting tasks, grooming, and review of shower stall transfer.  Husband present for session and available to assist at home as needed.  Pt. Eager for d/c home later today.  Follow Up Recommendations  DC plan and follow up therapy as arranged by surgeon;Supervision - Intermittent    Equipment Recommendations  None recommended by OT    Recommendations for Other Services      Precautions / Restrictions Precautions Precautions: Fall;Knee Restrictions Weight Bearing Restrictions: Yes LLE Weight Bearing: Weight bearing as tolerated       Mobility Bed Mobility               General bed mobility comments: reviewed very high bed, set bed height as indicated pt. states she does not want to attempt getting into a really high bed and will either sleep in recliner or other bedroom they have  Transfers Overall transfer level: Needs assistance Equipment used: Rolling walker (2 wheeled) Transfers: Sit to/from [employer] Sit to Stand: Min guard Stand pivot transfers: Min guard       General transfer comment: pt. remains guarded and concerned with full weight bearing on LLE during functional mobility.  reviewed pt. concerns with PT for her session to address further    Balance                                           ADL either performed or assessed with clinical judgement   ADL Overall ADL's : Needs assistance/impaired     Grooming: Wash/dry hands;Standing;Wash/dry face;Oral care;Brushing hair;Supervision/safety         Lower Body Bathing Details (indicate cue type and reason): reports husband available to assist       Lower Body Dressing Details (indicate cue type and reason): reports  husband available to assist Toilet Transfer: RW;Ambulation;Comfort height toilet;BSC Toilet Transfer Details (indicate cue type and reason): 3n1 over commode, husband reports he already has the 3n1 set up over their commode at home [place] and Hygiene: Supervision/safety;Sit to/from stand   Tub/ Banker: Walk-in shower;Shower seat;Grab Engineer, agricultural Details (indicate cue type and reason): pt. and spouse report they have large walk in shower handicap adapted with small 1 inch ledge, multiple grab bars, and built in shower seat.  pt. unable to step over 1inch simulated leg as she is nervous about full weight bearing on LLE.  states she will sponge bathe initially until she feels she can step over the ledge Functional mobility during ADLs: Min guard;Rolling walker       Vision       Perception     Praxis      Cognition                                                Exercises     Shoulder Instructions       General Comments      Pertinent Vitals/ Pain       Pain Assessment: 0-10 Pain Score: 4  Pain  Location: left knee Pain Descriptors / Indicators: Grimacing;Guarding;Sore Pain Intervention(s): Monitored during session;Repositioned;Premedicated before session  Home Living                                          Prior Functioning/Environment              Frequency  Min 2X/week        Progress Toward Goals  OT Goals(current goals can now be found in the care plan section)  Progress towards OT goals: Progressing toward goals     Plan      Co-evaluation                 AM-PAC PT "6 Clicks" Daily Activity     Outcome Measure   Help from another person eating meals?: None Help from another person taking care of personal grooming?: A Little Help from another person toileting, which includes using toliet, bedpan, or urinal?: A Little Help from another  person bathing (including washing, rinsing, drying)?: A Little Help from another person to put on and taking off regular upper body clothing?: A Little Help from another person to put on and taking off regular lower body clothing?: A Lot 6 Click Score: 18    End of Session Equipment Utilized During Treatment: Rolling walker CPM Left Knee CPM Left Knee: On Left Knee Flexion (Degrees): 50 Left Knee Extension (Degrees): 0 Additional Comments: (2 hours)  OT Visit Diagnosis: Pain Pain - Right/Left: Left Pain - part of body: Knee   Activity Tolerance Patient tolerated treatment well   Patient Left in chair;with call bell/phone within reach;with family/visitor present   Nurse Communication          Time: 0981-1914 OT Time Calculation (min): 17 min  Charges: OT General Charges $OT Visit: 1 Visit OT Treatments $Self Care/Home Management : 8-22 mins  Janice Coffin, COTA/L 07/13/2017, 9:25 AM

## 2017-07-15 ENCOUNTER — Telehealth (INDEPENDENT_AMBULATORY_CARE_PROVIDER_SITE_OTHER): Payer: Self-pay | Admitting: Orthopaedic Surgery

## 2017-07-15 DIAGNOSIS — Z471 Aftercare following joint replacement surgery: Secondary | ICD-10-CM | POA: Diagnosis not present

## 2017-07-15 DIAGNOSIS — F17210 Nicotine dependence, cigarettes, uncomplicated: Secondary | ICD-10-CM | POA: Diagnosis not present

## 2017-07-15 DIAGNOSIS — Z7982 Long term (current) use of aspirin: Secondary | ICD-10-CM | POA: Diagnosis not present

## 2017-07-15 DIAGNOSIS — Z96652 Presence of left artificial knee joint: Secondary | ICD-10-CM | POA: Diagnosis not present

## 2017-07-15 NOTE — Telephone Encounter (Signed)
Called Flora back to advise

## 2017-07-15 NOTE — Telephone Encounter (Signed)
She will be set up once she's done with HHPT

## 2017-07-15 NOTE — Telephone Encounter (Signed)
[name],[name]  1961-05-11   [facility]  Dianah Field [phone]  Verbal orders  Twice this week  Three times following week  Physical therapist wanted to know if pt is setup for outpatient therapy

## 2017-07-15 NOTE — Telephone Encounter (Signed)
See message.

## 2017-07-16 ENCOUNTER — Telehealth (INDEPENDENT_AMBULATORY_CARE_PROVIDER_SITE_OTHER): Payer: Self-pay | Admitting: Orthopaedic Surgery

## 2017-07-16 ENCOUNTER — Other Ambulatory Visit (INDEPENDENT_AMBULATORY_CARE_PROVIDER_SITE_OTHER): Payer: Self-pay

## 2017-07-16 MED ORDER — OXYCODONE HCL 5 MG PO TABS: 5.0000 mg | ORAL_TABLET | ORAL | 0 refills | Status: DC | PRN

## 2017-07-16 NOTE — Telephone Encounter (Signed)
Patient aware Rx ready at front desk  

## 2017-07-16 NOTE — Telephone Encounter (Signed)
Can you advise on message for Dr Erlinda Hong since he is not in the office today. Patient had SU 07/10/17 and we will be closed Thursday and Friday. Thanks.    (LEFT TOTAL KNEE ARTHROPLASTY - 07/10/17)

## 2017-07-16 NOTE — Telephone Encounter (Signed)
OK TO FILL

## 2017-07-16 NOTE — Telephone Encounter (Signed)
[facility] on [address]      Oxycodone 5 mg pt takes 2 every four hours.Pt stated she is almost out.  oxycontin 10mg  12 hr tablet.Pt has four left

## 2017-07-17 DIAGNOSIS — Z471 Aftercare following joint replacement surgery: Secondary | ICD-10-CM | POA: Diagnosis not present

## 2017-07-17 DIAGNOSIS — Z7982 Long term (current) use of aspirin: Secondary | ICD-10-CM | POA: Diagnosis not present

## 2017-07-17 DIAGNOSIS — F17210 Nicotine dependence, cigarettes, uncomplicated: Secondary | ICD-10-CM | POA: Diagnosis not present

## 2017-07-17 DIAGNOSIS — Z96652 Presence of left artificial knee joint: Secondary | ICD-10-CM | POA: Diagnosis not present

## 2017-07-19 NOTE — Discharge Summary (Signed)
Physician Discharge Summary      Patient ID: [name] MRN: [id number] DOB/AGE: December 02, 1960 56 y.o.  Admit date: 07/10/2017 Discharge date: 07/19/2017  Admission Diagnoses:  <principal problem not specified>  Discharge Diagnoses:  Active Problems:   Total knee replacement status   Past Medical History:  Diagnosis Date  . Anxiety   . Arthritis   . Dysrhythmia    hx of afib, afib ablation in 01/2014, no afib since then  . Pneumonia    as a child    Surgeries: Procedure(s): LEFT TOTAL KNEE ARTHROPLASTY on 07/10/2017   Consultants (if any):   Discharged Condition: Improved  Hospital Course: [name] is an 56 y.o. female who was admitted 07/10/2017 with a diagnosis of <principal problem not specified> and went to the operating room on 07/10/2017 and underwent the above named procedures.    She was given perioperative antibiotics:  Anti-infectives (From admission, onward)   Start     Dose/Rate Route Frequency Ordered Stop   07/11/17 0600  ceFAZolin (ANCEF) IVPB 2g/100 mL premix     2 g 200 mL/hr over 30 Minutes Intravenous On call to O.R. 07/10/17 1201 07/10/17 1353   07/10/17 2000  ceFAZolin (ANCEF) IVPB 2g/100 mL premix     2 g 200 mL/hr over 30 Minutes Intravenous Every 6 hours 07/10/17 1631 07/11/17 1359   07/10/17 1436  vancomycin (VANCOCIN) powder  Status:  Discontinued       As needed 07/10/17 1436 07/10/17 1619    .  She was given sequential compression devices, early ambulation, and xarelto for DVT prophylaxis.  She benefited maximally from the hospital stay and there were no complications.    Recent vital signs:  Vitals:   07/12/17 2323 07/13/17 0400  BP:  (!) 148/83  Pulse:  88  Resp: 18 16  Temp:  98.4 F (36.9 C)  SpO2:  94%    Recent laboratory studies:  Lab Results  Component Value Date   HGB 12.8 07/11/2017   HGB 15.7 (H) 07/01/2017   HGB 14.9 05/04/2017   Lab Results  Component Value Date   WBC 6.3 07/11/2017   PLT 152  07/11/2017   Lab Results  Component Value Date   INR 0.98 07/01/2017   Lab Results  Component Value Date   NA 139 07/11/2017   K 3.8 07/11/2017   CL 106 07/11/2017   CO2 27 07/11/2017   BUN 9 07/11/2017   CREATININE 0.62 07/11/2017   GLUCOSE 87 07/11/2017    Discharge Medications:   Allergies as of 07/13/2017   No Known Allergies     Medication List    TAKE these medications   ALPRAZolam 0.5 MG tablet Commonly known as:  XANAX Take 0.25 mg by mouth daily as needed for anxiety.   aspirin EC 325 MG tablet Take 1 tablet (325 mg total) 2 (two) times daily by mouth.   diclofenac 75 MG EC tablet Commonly known as:  VOLTAREN Take 1 tablet (75 mg total) by mouth 2 (two) times daily.   ibuprofen 200 MG tablet Commonly known as:  ADVIL,MOTRIN Take 400 mg by mouth every 8 (eight) hours as needed for mild pain or moderate pain.   Melatonin 3 MG Tabs Take 3 mg by mouth at bedtime.   ondansetron 4 MG tablet Commonly known as:  ZOFRAN Take 1-2 tablets (4-8 mg total) every 8 (eight) hours as needed by mouth for nausea or vomiting.   promethazine 25 MG tablet Commonly known as:  PHENERGAN  Take 1 tablet (25 mg total) every 6 (six) hours as needed by mouth for nausea.   senna-docusate 8.6-50 MG tablet Commonly known as:  SENOKOT S Take 1 tablet at bedtime as needed by mouth.   tiZANidine 4 MG tablet Commonly known as:  ZANAFLEX Take 1 tablet (4 mg total) every 6 (six) hours as needed by mouth for muscle spasms.       Diagnostic Studies: Dg Knee Left Port  Result Date: 07/10/2017 CLINICAL DATA:  Total knee replacement EXAM: PORTABLE LEFT KNEE - 1-2 VIEW COMPARISON:  X-rays from 01/16/2017 FINDINGS: Two-view portable study shows interval right tricompartmental knee replacement. No evidence for immediate hardware complications. Gas in the joint space and soft tissues is compatible with the immediate postoperative state. IMPRESSION: Status post left total knee replacement  without evidence for immediate hardware complications. Electronically Signed   By: Misty Stanley M.D.   On: 07/10/2017 18:46    Disposition: 01-Home or Self Care  Discharge Instructions    Call MD / Call 911   Complete by:  As directed    If you experience chest pain or shortness of breath, CALL 911 and be transported to the hospital emergency room.  If you develope a fever above 101 F, pus (white drainage) or increased drainage or redness at the wound, or calf pain, call your surgeon's office.   Constipation Prevention   Complete by:  As directed    Drink plenty of fluids.  Prune juice may be helpful.  You may use a stool softener, such as Colace (over the counter) 100 mg twice a day.  Use MiraLax (over the counter) for constipation as needed.   Diet - low sodium heart healthy   Complete by:  As directed    Increase activity slowly as tolerated   Complete by:  As directed       Follow-up Information    Leandrew Koyanagi, MD In 2 weeks.   Specialty:  Orthopedic Surgery Why:  For suture removal, For wound re-check Contact information: [place] [address] [phone]            Signed: Eduard Roux 07/19/2017, 9:02 AM

## 2017-07-21 ENCOUNTER — Telehealth (INDEPENDENT_AMBULATORY_CARE_PROVIDER_SITE_OTHER): Payer: Self-pay | Admitting: Orthopaedic Surgery

## 2017-07-21 ENCOUNTER — Other Ambulatory Visit (INDEPENDENT_AMBULATORY_CARE_PROVIDER_SITE_OTHER): Payer: Self-pay | Admitting: Radiology

## 2017-07-21 DIAGNOSIS — Z96652 Presence of left artificial knee joint: Secondary | ICD-10-CM | POA: Diagnosis not present

## 2017-07-21 DIAGNOSIS — F17210 Nicotine dependence, cigarettes, uncomplicated: Secondary | ICD-10-CM | POA: Diagnosis not present

## 2017-07-21 DIAGNOSIS — Z7982 Long term (current) use of aspirin: Secondary | ICD-10-CM | POA: Diagnosis not present

## 2017-07-21 DIAGNOSIS — Z471 Aftercare following joint replacement surgery: Secondary | ICD-10-CM | POA: Diagnosis not present

## 2017-07-21 MED ORDER — OXYCODONE HCL 5 MG PO TABS: 5.0000 mg | ORAL_TABLET | ORAL | 0 refills | Status: DC | PRN

## 2017-07-21 NOTE — Telephone Encounter (Signed)
See massage below.  

## 2017-07-21 NOTE — Telephone Encounter (Signed)
Pt also request RX refill on Oxycontin. Patient states they ran out and she is only a week and a half post op. She also has a lot of swelling and wants to know if there is anything else besides ice that she can do to help. CB# [phone]

## 2017-07-21 NOTE — Telephone Encounter (Signed)
done

## 2017-07-21 NOTE — Telephone Encounter (Signed)
Patient requests that you call her, she is a post op patient and she had some questions about medication, etc. CB # [phone]

## 2017-07-21 NOTE — Telephone Encounter (Signed)
Oxycodone 5 mg. 1-2 tabs po bid prn pain #30

## 2017-07-23 DIAGNOSIS — F17210 Nicotine dependence, cigarettes, uncomplicated: Secondary | ICD-10-CM | POA: Diagnosis not present

## 2017-07-23 DIAGNOSIS — Z7982 Long term (current) use of aspirin: Secondary | ICD-10-CM | POA: Diagnosis not present

## 2017-07-23 DIAGNOSIS — Z96652 Presence of left artificial knee joint: Secondary | ICD-10-CM | POA: Diagnosis not present

## 2017-07-23 DIAGNOSIS — Z471 Aftercare following joint replacement surgery: Secondary | ICD-10-CM | POA: Diagnosis not present

## 2017-07-24 ENCOUNTER — Ambulatory Visit (INDEPENDENT_AMBULATORY_CARE_PROVIDER_SITE_OTHER): Payer: BLUE CROSS/BLUE SHIELD | Admitting: Orthopaedic Surgery

## 2017-07-24 ENCOUNTER — Encounter (INDEPENDENT_AMBULATORY_CARE_PROVIDER_SITE_OTHER): Payer: Self-pay | Admitting: Orthopaedic Surgery

## 2017-07-24 DIAGNOSIS — M1712 Unilateral primary osteoarthritis, left knee: Secondary | ICD-10-CM

## 2017-07-24 MED ORDER — OXYCODONE HCL 5 MG PO TABS: 5.0000 mg | ORAL_TABLET | Freq: Three times a day (TID) | ORAL | 0 refills | Status: DC | PRN

## 2017-07-24 MED ORDER — TIZANIDINE HCL 4 MG PO TABS: 4.0000 mg | ORAL_TABLET | Freq: Four times a day (QID) | ORAL | 2 refills | Status: DC | PRN

## 2017-07-24 NOTE — Progress Notes (Signed)
Patient is two-week status post left total knee replacement.  She is taking oxycodone for pain.  She has completed home health physical therapy.  She is complaining of leg cramps at night.  Her pain is mild to moderate.  Her incision is healed without signs of infection or drainage.  Her range of motion is coming along quite nicely.  Referral to outpatient physical therapy was made today.  Oxycodone was refilled.  Continue with aspirin for DVT prophylaxis.  Follow-up in 4 weeks with three-view x-rays of the left knee.

## 2017-07-25 DIAGNOSIS — F17210 Nicotine dependence, cigarettes, uncomplicated: Secondary | ICD-10-CM | POA: Diagnosis not present

## 2017-07-25 DIAGNOSIS — Z7982 Long term (current) use of aspirin: Secondary | ICD-10-CM | POA: Diagnosis not present

## 2017-07-25 DIAGNOSIS — Z471 Aftercare following joint replacement surgery: Secondary | ICD-10-CM | POA: Diagnosis not present

## 2017-07-25 DIAGNOSIS — Z96652 Presence of left artificial knee joint: Secondary | ICD-10-CM | POA: Diagnosis not present

## 2017-07-29 DIAGNOSIS — M25662 Stiffness of left knee, not elsewhere classified: Secondary | ICD-10-CM | POA: Diagnosis not present

## 2017-07-29 DIAGNOSIS — R269 Unspecified abnormalities of gait and mobility: Secondary | ICD-10-CM | POA: Diagnosis not present

## 2017-07-29 DIAGNOSIS — M25562 Pain in left knee: Secondary | ICD-10-CM | POA: Diagnosis not present

## 2017-07-29 DIAGNOSIS — M25462 Effusion, left knee: Secondary | ICD-10-CM | POA: Diagnosis not present

## 2017-07-30 ENCOUNTER — Other Ambulatory Visit (INDEPENDENT_AMBULATORY_CARE_PROVIDER_SITE_OTHER): Payer: Self-pay

## 2017-07-30 ENCOUNTER — Telehealth (INDEPENDENT_AMBULATORY_CARE_PROVIDER_SITE_OTHER): Payer: Self-pay | Admitting: Orthopaedic Surgery

## 2017-07-30 MED ORDER — OXYCODONE-ACETAMINOPHEN 5-325 MG PO TABS: ORAL_TABLET | ORAL | 0 refills | Status: DC

## 2017-07-30 NOTE — Telephone Encounter (Signed)
Med refill   Oxycodone pt stated she is on her last med

## 2017-07-30 NOTE — Telephone Encounter (Signed)
Pending Signature. Rx will be ready for pick up tomorrow AM.

## 2017-07-30 NOTE — Telephone Encounter (Signed)
#  30.  1-2 tab po bid prn pain.

## 2017-07-30 NOTE — Telephone Encounter (Signed)
Please advise 

## 2017-07-31 DIAGNOSIS — M25562 Pain in left knee: Secondary | ICD-10-CM | POA: Diagnosis not present

## 2017-07-31 DIAGNOSIS — M25662 Stiffness of left knee, not elsewhere classified: Secondary | ICD-10-CM | POA: Diagnosis not present

## 2017-07-31 DIAGNOSIS — R269 Unspecified abnormalities of gait and mobility: Secondary | ICD-10-CM | POA: Diagnosis not present

## 2017-07-31 DIAGNOSIS — M25462 Effusion, left knee: Secondary | ICD-10-CM | POA: Diagnosis not present

## 2017-08-05 DIAGNOSIS — R269 Unspecified abnormalities of gait and mobility: Secondary | ICD-10-CM | POA: Diagnosis not present

## 2017-08-05 DIAGNOSIS — M25562 Pain in left knee: Secondary | ICD-10-CM | POA: Diagnosis not present

## 2017-08-05 DIAGNOSIS — M25462 Effusion, left knee: Secondary | ICD-10-CM | POA: Diagnosis not present

## 2017-08-05 DIAGNOSIS — M25662 Stiffness of left knee, not elsewhere classified: Secondary | ICD-10-CM | POA: Diagnosis not present

## 2017-08-07 ENCOUNTER — Telehealth (INDEPENDENT_AMBULATORY_CARE_PROVIDER_SITE_OTHER): Payer: Self-pay | Admitting: Orthopaedic Surgery

## 2017-08-07 DIAGNOSIS — M25562 Pain in left knee: Secondary | ICD-10-CM | POA: Diagnosis not present

## 2017-08-07 DIAGNOSIS — M25462 Effusion, left knee: Secondary | ICD-10-CM | POA: Diagnosis not present

## 2017-08-07 DIAGNOSIS — R269 Unspecified abnormalities of gait and mobility: Secondary | ICD-10-CM | POA: Diagnosis not present

## 2017-08-07 DIAGNOSIS — M25662 Stiffness of left knee, not elsewhere classified: Secondary | ICD-10-CM | POA: Diagnosis not present

## 2017-08-07 NOTE — Telephone Encounter (Signed)
Patient called for RX for pain Oxycodone 5 mg. Please call patient to let her now when she can get the script.

## 2017-08-08 ENCOUNTER — Other Ambulatory Visit (INDEPENDENT_AMBULATORY_CARE_PROVIDER_SITE_OTHER): Payer: Self-pay

## 2017-08-08 MED ORDER — OXYCODONE HCL 5 MG PO TABS: 5.0000 mg | ORAL_TABLET | Freq: Three times a day (TID) | ORAL | 0 refills | Status: DC | PRN

## 2017-08-08 NOTE — Telephone Encounter (Signed)
Rx given

## 2017-08-08 NOTE — Telephone Encounter (Signed)
Yes #30.  1-2 tabs po bid prn pain

## 2017-08-08 NOTE — Telephone Encounter (Signed)
Please advise 

## 2017-08-11 DIAGNOSIS — M25562 Pain in left knee: Secondary | ICD-10-CM | POA: Diagnosis not present

## 2017-08-11 DIAGNOSIS — R269 Unspecified abnormalities of gait and mobility: Secondary | ICD-10-CM | POA: Diagnosis not present

## 2017-08-11 DIAGNOSIS — M25662 Stiffness of left knee, not elsewhere classified: Secondary | ICD-10-CM | POA: Diagnosis not present

## 2017-08-11 DIAGNOSIS — M25462 Effusion, left knee: Secondary | ICD-10-CM | POA: Diagnosis not present

## 2017-08-13 DIAGNOSIS — M25462 Effusion, left knee: Secondary | ICD-10-CM | POA: Diagnosis not present

## 2017-08-13 DIAGNOSIS — M25562 Pain in left knee: Secondary | ICD-10-CM | POA: Diagnosis not present

## 2017-08-13 DIAGNOSIS — M25662 Stiffness of left knee, not elsewhere classified: Secondary | ICD-10-CM | POA: Diagnosis not present

## 2017-08-13 DIAGNOSIS — R269 Unspecified abnormalities of gait and mobility: Secondary | ICD-10-CM | POA: Diagnosis not present

## 2017-08-15 ENCOUNTER — Other Ambulatory Visit (INDEPENDENT_AMBULATORY_CARE_PROVIDER_SITE_OTHER): Payer: Self-pay

## 2017-08-15 ENCOUNTER — Telehealth (INDEPENDENT_AMBULATORY_CARE_PROVIDER_SITE_OTHER): Payer: Self-pay | Admitting: Orthopaedic Surgery

## 2017-08-15 DIAGNOSIS — M25662 Stiffness of left knee, not elsewhere classified: Secondary | ICD-10-CM | POA: Diagnosis not present

## 2017-08-15 DIAGNOSIS — M25562 Pain in left knee: Secondary | ICD-10-CM | POA: Diagnosis not present

## 2017-08-15 DIAGNOSIS — M25462 Effusion, left knee: Secondary | ICD-10-CM | POA: Diagnosis not present

## 2017-08-15 DIAGNOSIS — R269 Unspecified abnormalities of gait and mobility: Secondary | ICD-10-CM | POA: Diagnosis not present

## 2017-08-15 MED ORDER — OXYCODONE-ACETAMINOPHEN 5-325 MG PO TABS
ORAL_TABLET | ORAL | 0 refills | Status: DC
Start: 2017-08-15 — End: 2018-02-23

## 2017-08-15 NOTE — Telephone Encounter (Signed)
Please advise 

## 2017-08-15 NOTE — Telephone Encounter (Signed)
#  20.  1-2 tabs po daily prn

## 2017-08-15 NOTE — Telephone Encounter (Signed)
RX pending signature   patient aware can pick up this PM

## 2017-08-15 NOTE — Telephone Encounter (Signed)
Patient called needing Rx refilled (Oxycodone) The number to contact patient is [phone]

## 2017-08-20 DIAGNOSIS — M25462 Effusion, left knee: Secondary | ICD-10-CM | POA: Diagnosis not present

## 2017-08-20 DIAGNOSIS — M25562 Pain in left knee: Secondary | ICD-10-CM | POA: Diagnosis not present

## 2017-08-20 DIAGNOSIS — M25662 Stiffness of left knee, not elsewhere classified: Secondary | ICD-10-CM | POA: Diagnosis not present

## 2017-08-20 DIAGNOSIS — R269 Unspecified abnormalities of gait and mobility: Secondary | ICD-10-CM | POA: Diagnosis not present

## 2017-08-22 ENCOUNTER — Ambulatory Visit (INDEPENDENT_AMBULATORY_CARE_PROVIDER_SITE_OTHER): Payer: BLUE CROSS/BLUE SHIELD | Admitting: Orthopaedic Surgery

## 2017-08-22 DIAGNOSIS — R269 Unspecified abnormalities of gait and mobility: Secondary | ICD-10-CM | POA: Diagnosis not present

## 2017-08-22 DIAGNOSIS — M25462 Effusion, left knee: Secondary | ICD-10-CM | POA: Diagnosis not present

## 2017-08-22 DIAGNOSIS — M25562 Pain in left knee: Secondary | ICD-10-CM | POA: Diagnosis not present

## 2017-08-22 DIAGNOSIS — M25662 Stiffness of left knee, not elsewhere classified: Secondary | ICD-10-CM | POA: Diagnosis not present

## 2017-08-25 ENCOUNTER — Encounter (INDEPENDENT_AMBULATORY_CARE_PROVIDER_SITE_OTHER): Payer: Self-pay | Admitting: Orthopaedic Surgery

## 2017-08-25 ENCOUNTER — Ambulatory Visit (INDEPENDENT_AMBULATORY_CARE_PROVIDER_SITE_OTHER): Payer: BLUE CROSS/BLUE SHIELD | Admitting: Orthopaedic Surgery

## 2017-08-25 ENCOUNTER — Ambulatory Visit (INDEPENDENT_AMBULATORY_CARE_PROVIDER_SITE_OTHER): Payer: Self-pay

## 2017-08-25 DIAGNOSIS — Z96652 Presence of left artificial knee joint: Secondary | ICD-10-CM

## 2017-08-25 MED ORDER — TRAMADOL HCL 50 MG PO TABS: 50.0000 mg | ORAL_TABLET | Freq: Three times a day (TID) | ORAL | 2 refills | Status: DC | PRN

## 2017-08-25 MED ORDER — FUROSEMIDE 20 MG PO TABS: 20.0000 mg | ORAL_TABLET | Freq: Every day | ORAL | 0 refills | Status: DC

## 2017-08-25 NOTE — Progress Notes (Signed)
Office Visit Note   Patient: [name]           Date of Birth: [birth year]           MRN: [id number] Visit Date: 08/25/2017              Requested by: Chesley Noon, MD [place], [place] [address] PCP: Chesley Noon, MD   Assessment & Plan: Visit Diagnoses:  1. Status post total left knee replacement     Plan: Patient is doing well for 6-week mark.  Low-dose Lasix prescribed.  Tramadol prescribed today.  Continue with physical therapy.  Follow-up in 6 weeks for 67-month visit.  Questions encouraged and answered.  Follow-Up Instructions: Return in about 6 weeks (around 10/06/2017).   Orders:  Orders Placed This Encounter  Procedures  . XR KNEE 3 VIEW LEFT   Meds ordered this encounter  Medications  . furosemide (LASIX) 20 MG tablet    Sig: Take 1 tablet (20 mg total) by mouth daily.    Dispense:  30 tablet    Refill:  0  . traMADol (ULTRAM) 50 MG tablet    Sig: Take 1-2 tablets (50-100 mg total) by mouth 3 (three) times daily as needed.    Dispense:  30 tablet    Refill:  2      Procedures: No procedures performed   Clinical Data: No additional findings.   Subjective: Chief Complaint  Patient presents with  . Left Knee - Pain    HPI Patient is 6 weeks status post left total knee replacement.  She is overall doing well other than just swelling that is limiting her range of motion.  She does feel much better in terms of pain.  She is no longer taking pain medicines Review of Systems   Objective: Vital Signs: There were no vitals taken for this visit.  Physical Exam  Ortho Exam Surgical scar is fully healed.  Range of motion 0-90 degrees.  She does have moderate swelling. Specialty Comments:  No specialty comments available.  Imaging: Xr Knee 3 View Left  Result Date: 08/25/2017 Stable left total knee replacement in good alignment    PMFS History: Patient Active Problem List   Diagnosis Date Noted  . Total knee  replacement status 07/10/2017  . Unilateral primary osteoarthritis, left knee 06/23/2017  . Chondromalacia of left patella 01/16/2017  . TOBACCO ABUSE 10/25/2008  . PALPITATIONS 10/25/2008  . DYSPNEA 10/25/2008   Past Medical History:  Diagnosis Date  . Anxiety   . Arthritis   . Dysrhythmia    hx of afib, afib ablation in 01/2014, no afib since then  . Pneumonia    as a child    History reviewed. No pertinent family history.  Past Surgical History:  Procedure Laterality Date  . ABDOMINAL HYSTERECTOMY    . BLADDER SURGERY  2000  . CARDIAC ELECTROPHYSIOLOGY MAPPING AND ABLATION     for afib  . CESAREAN SECTION    . CHOLECYSTECTOMY    . fallopian tube removal    . loop recorder explant  2015  . LOOP RECORDER IMPLANT  2015  . TONSILLECTOMY AND ADENOIDECTOMY    . TOTAL KNEE ARTHROPLASTY Left 07/10/2017   Procedure: LEFT TOTAL KNEE ARTHROPLASTY;  Surgeon: Leandrew Koyanagi, MD;  Location: [facility];  Service: Orthopedics;  Laterality: Left;  Marland Kitchen VAGINAL HYSTERECTOMY     Social History   Occupational History  . Occupation: Glass blower/designer  Tobacco Use  .  Smoking status: Current Every Day Smoker    Packs/day: 0.50    Types: Cigarettes  . Smokeless tobacco: Never Used  Substance and Sexual Activity  . Alcohol use: No    Frequency: Never  . Drug use: No  . Sexual activity: Yes    Birth control/protection: Surgical

## 2017-08-27 DIAGNOSIS — M25662 Stiffness of left knee, not elsewhere classified: Secondary | ICD-10-CM | POA: Diagnosis not present

## 2017-08-27 DIAGNOSIS — R269 Unspecified abnormalities of gait and mobility: Secondary | ICD-10-CM | POA: Diagnosis not present

## 2017-08-27 DIAGNOSIS — M25562 Pain in left knee: Secondary | ICD-10-CM | POA: Diagnosis not present

## 2017-08-27 DIAGNOSIS — M25462 Effusion, left knee: Secondary | ICD-10-CM | POA: Diagnosis not present

## 2017-08-28 DIAGNOSIS — Z9889 Other specified postprocedural states: Secondary | ICD-10-CM | POA: Diagnosis not present

## 2017-08-28 DIAGNOSIS — Z0189 Encounter for other specified special examinations: Secondary | ICD-10-CM | POA: Diagnosis not present

## 2017-08-28 DIAGNOSIS — R002 Palpitations: Secondary | ICD-10-CM | POA: Diagnosis not present

## 2017-08-28 DIAGNOSIS — Z8679 Personal history of other diseases of the circulatory system: Secondary | ICD-10-CM | POA: Diagnosis not present

## 2017-08-29 DIAGNOSIS — M25462 Effusion, left knee: Secondary | ICD-10-CM | POA: Diagnosis not present

## 2017-08-29 DIAGNOSIS — M25662 Stiffness of left knee, not elsewhere classified: Secondary | ICD-10-CM | POA: Diagnosis not present

## 2017-08-29 DIAGNOSIS — R269 Unspecified abnormalities of gait and mobility: Secondary | ICD-10-CM | POA: Diagnosis not present

## 2017-08-29 DIAGNOSIS — M25562 Pain in left knee: Secondary | ICD-10-CM | POA: Diagnosis not present

## 2017-09-02 DIAGNOSIS — M25462 Effusion, left knee: Secondary | ICD-10-CM | POA: Diagnosis not present

## 2017-09-02 DIAGNOSIS — R269 Unspecified abnormalities of gait and mobility: Secondary | ICD-10-CM | POA: Diagnosis not present

## 2017-09-02 DIAGNOSIS — R002 Palpitations: Secondary | ICD-10-CM | POA: Diagnosis not present

## 2017-09-02 DIAGNOSIS — M25662 Stiffness of left knee, not elsewhere classified: Secondary | ICD-10-CM | POA: Diagnosis not present

## 2017-09-02 DIAGNOSIS — M25562 Pain in left knee: Secondary | ICD-10-CM | POA: Diagnosis not present

## 2017-09-04 DIAGNOSIS — M25562 Pain in left knee: Secondary | ICD-10-CM | POA: Diagnosis not present

## 2017-09-04 DIAGNOSIS — R269 Unspecified abnormalities of gait and mobility: Secondary | ICD-10-CM | POA: Diagnosis not present

## 2017-09-04 DIAGNOSIS — M25462 Effusion, left knee: Secondary | ICD-10-CM | POA: Diagnosis not present

## 2017-09-04 DIAGNOSIS — M25662 Stiffness of left knee, not elsewhere classified: Secondary | ICD-10-CM | POA: Diagnosis not present

## 2017-09-08 ENCOUNTER — Ambulatory Visit (INDEPENDENT_AMBULATORY_CARE_PROVIDER_SITE_OTHER): Payer: BLUE CROSS/BLUE SHIELD | Admitting: Orthopaedic Surgery

## 2017-09-08 ENCOUNTER — Encounter (INDEPENDENT_AMBULATORY_CARE_PROVIDER_SITE_OTHER): Payer: Self-pay | Admitting: Orthopaedic Surgery

## 2017-09-08 DIAGNOSIS — Z96652 Presence of left artificial knee joint: Secondary | ICD-10-CM

## 2017-09-08 NOTE — Progress Notes (Signed)
Office Visit Note   Patient: [name]           Date of Birth: Mar 28, 1961           MRN: [id number] Visit Date: 09/08/2017              Requested by: Chesley Noon, MD [place], [place] [address] PCP: Chesley Noon, MD   Assessment & Plan: Visit Diagnoses:  1. S/P TKR (total knee replacement), left     Plan: At this point I believe [name] is having postsurgical changes to include possible neuroma or adhesions.  I am not concerned with infection.  She will continue with outpatient physical therapy and will follow up with [place] at her regularly scheduled appointment in February.  Follow-Up Instructions: Return in about 4 weeks (around 10/06/2017).   Orders:  No orders of the defined types were placed in this encounter.  No orders of the defined types were placed in this encounter.     Procedures: No procedures performed   Clinical Data: No additional findings.   Subjective: Chief Complaint  Patient presents with  . Left Knee - Pain, Follow-up    HPI [name] comes in today with concerns about her left knee.  She is 8 weeks out left total knee replacement, 07/10/2017.  She has been doing okay with regaining motion and physical therapy.  She was having minimal pain up until about 2 weeks ago.  At that point she noted increased swelling and burning to the lateral aspect of the distal fourth of the incision.  There is been no new injury or change in activity.  No fevers no chills.  No drainage noted from the incision.  No regression in range of motion.  Review of Systems as detailed in HPI.  All others reviewed are negative.   Objective: Vital Signs: There were no vitals taken for this visit.  Physical Exam well-developed well-nourished female in no acute distress.  Alert and oriented x3.  Ortho Exam examination of her left knee reveals a well-healed surgical incision without evidence of infection.  There is a small barely noticeable area of  swelling to the lateral aspect of the distal fourth of the incision.  No drainage.  Moderate tenderness over this area.  No erythema no warmth.  Examination of the left knee reveals range of motion from 0-90 degrees.  She is stable to valgus varus stress.  Specialty Comments:  No specialty comments available.  Imaging: No results found.   PMFS History: Patient Active Problem List   Diagnosis Date Noted  . S/P TKR (total knee replacement), left 07/10/2017  . Unilateral primary osteoarthritis, left knee 06/23/2017  . Chondromalacia of left patella 01/16/2017  . TOBACCO ABUSE 10/25/2008  . PALPITATIONS 10/25/2008  . DYSPNEA 10/25/2008   Past Medical History:  Diagnosis Date  . Anxiety   . Arthritis   . Dysrhythmia    hx of afib, afib ablation in 01/2014, no afib since then  . Pneumonia    as a child    History reviewed. No pertinent family history.  Past Surgical History:  Procedure Laterality Date  . ABDOMINAL HYSTERECTOMY    . BLADDER SURGERY  2000  . CARDIAC ELECTROPHYSIOLOGY MAPPING AND ABLATION     for afib  . CESAREAN SECTION    . CHOLECYSTECTOMY    . fallopian tube removal    . loop recorder explant  2015  . LOOP RECORDER IMPLANT  2015  . TONSILLECTOMY AND  ADENOIDECTOMY    . TOTAL KNEE ARTHROPLASTY Left 07/10/2017   Procedure: LEFT TOTAL KNEE ARTHROPLASTY;  Surgeon: Leandrew Koyanagi, MD;  Location: [facility];  Service: Orthopedics;  Laterality: Left;  Marland Kitchen VAGINAL HYSTERECTOMY     Social History   Occupational History  . Occupation: Glass blower/designer  Tobacco Use  . Smoking status: Current Every Day Smoker    Packs/day: 0.50    Types: Cigarettes  . Smokeless tobacco: Never Used  Substance and Sexual Activity  . Alcohol use: No    Frequency: Never  . Drug use: No  . Sexual activity: Yes    Birth control/protection: Surgical

## 2017-09-09 DIAGNOSIS — M25462 Effusion, left knee: Secondary | ICD-10-CM | POA: Diagnosis not present

## 2017-09-09 DIAGNOSIS — R269 Unspecified abnormalities of gait and mobility: Secondary | ICD-10-CM | POA: Diagnosis not present

## 2017-09-09 DIAGNOSIS — M25662 Stiffness of left knee, not elsewhere classified: Secondary | ICD-10-CM | POA: Diagnosis not present

## 2017-09-09 DIAGNOSIS — M25562 Pain in left knee: Secondary | ICD-10-CM | POA: Diagnosis not present

## 2017-09-11 ENCOUNTER — Ambulatory Visit (INDEPENDENT_AMBULATORY_CARE_PROVIDER_SITE_OTHER): Payer: BLUE CROSS/BLUE SHIELD | Admitting: Orthopaedic Surgery

## 2017-09-11 DIAGNOSIS — M25662 Stiffness of left knee, not elsewhere classified: Secondary | ICD-10-CM | POA: Diagnosis not present

## 2017-09-11 DIAGNOSIS — R269 Unspecified abnormalities of gait and mobility: Secondary | ICD-10-CM | POA: Diagnosis not present

## 2017-09-11 DIAGNOSIS — M25462 Effusion, left knee: Secondary | ICD-10-CM | POA: Diagnosis not present

## 2017-09-11 DIAGNOSIS — M25562 Pain in left knee: Secondary | ICD-10-CM | POA: Diagnosis not present

## 2017-09-15 DIAGNOSIS — R269 Unspecified abnormalities of gait and mobility: Secondary | ICD-10-CM | POA: Diagnosis not present

## 2017-09-15 DIAGNOSIS — M25662 Stiffness of left knee, not elsewhere classified: Secondary | ICD-10-CM | POA: Diagnosis not present

## 2017-09-15 DIAGNOSIS — M25562 Pain in left knee: Secondary | ICD-10-CM | POA: Diagnosis not present

## 2017-09-15 DIAGNOSIS — M25462 Effusion, left knee: Secondary | ICD-10-CM | POA: Diagnosis not present

## 2017-09-16 ENCOUNTER — Telehealth (INDEPENDENT_AMBULATORY_CARE_PROVIDER_SITE_OTHER): Payer: Self-pay

## 2017-09-16 NOTE — Telephone Encounter (Signed)
Can you send this into her pharm.

## 2017-09-16 NOTE — Telephone Encounter (Signed)
Physical therapy has called and states that the pt has an appt tomorrow and wanted to use iontophoresis/ dexamethazone with this pt. She will need an rx sent to the pharmacy if you are in agreement and states that she has faxed an order to the office for signature.

## 2017-09-16 NOTE — Telephone Encounter (Signed)
Yes that's fine.  Can you hand write a Rx for this fax it to the pharmacy please.  I can never remember the dosage but you can find it at the bottom of the [facility].  It's something like 0.4% dexamethasone.  Thanks.

## 2017-09-17 ENCOUNTER — Telehealth (INDEPENDENT_AMBULATORY_CARE_PROVIDER_SITE_OTHER): Payer: Self-pay | Admitting: Orthopaedic Surgery

## 2017-09-17 DIAGNOSIS — M25662 Stiffness of left knee, not elsewhere classified: Secondary | ICD-10-CM | POA: Diagnosis not present

## 2017-09-17 DIAGNOSIS — M25562 Pain in left knee: Secondary | ICD-10-CM | POA: Diagnosis not present

## 2017-09-17 DIAGNOSIS — R269 Unspecified abnormalities of gait and mobility: Secondary | ICD-10-CM | POA: Diagnosis not present

## 2017-09-17 DIAGNOSIS — M25462 Effusion, left knee: Secondary | ICD-10-CM | POA: Diagnosis not present

## 2017-09-17 NOTE — Telephone Encounter (Signed)
Christy with [facility] pharmacy states they do not make the Dexamethasone that was called in this morning for patient. She did say that [facility] apparently makes that if you wanted to call it in there and make sure patient knows.

## 2017-09-17 NOTE — Telephone Encounter (Signed)
Faxed into pharm and also faxed [facility] a copy or order. Sent original order to scan.

## 2017-09-17 NOTE — Telephone Encounter (Signed)
Faxed to [facility] instead patient aware.

## 2017-09-21 ENCOUNTER — Other Ambulatory Visit (INDEPENDENT_AMBULATORY_CARE_PROVIDER_SITE_OTHER): Payer: Self-pay | Admitting: Orthopaedic Surgery

## 2017-09-22 DIAGNOSIS — M25662 Stiffness of left knee, not elsewhere classified: Secondary | ICD-10-CM | POA: Diagnosis not present

## 2017-09-22 DIAGNOSIS — M25462 Effusion, left knee: Secondary | ICD-10-CM | POA: Diagnosis not present

## 2017-09-22 DIAGNOSIS — M25562 Pain in left knee: Secondary | ICD-10-CM | POA: Diagnosis not present

## 2017-09-22 DIAGNOSIS — R269 Unspecified abnormalities of gait and mobility: Secondary | ICD-10-CM | POA: Diagnosis not present

## 2017-09-22 NOTE — Telephone Encounter (Signed)
No she needs to see her PCP

## 2017-09-22 NOTE — Telephone Encounter (Signed)
She doesn't need lasix anymore.  She just swells a little more than the average person

## 2017-09-22 NOTE — Telephone Encounter (Signed)
Called patient to advise on message below. Patient States you were the one who prescribed it not PCP. PCP think she does not need it. Swelling is still there. Therapist states if the swelling  doesn't go down by Thursday, patient will call us to get appt.

## 2017-09-23 NOTE — Telephone Encounter (Signed)
Called to advise on message below.   

## 2017-09-24 DIAGNOSIS — M25562 Pain in left knee: Secondary | ICD-10-CM | POA: Diagnosis not present

## 2017-09-24 DIAGNOSIS — R269 Unspecified abnormalities of gait and mobility: Secondary | ICD-10-CM | POA: Diagnosis not present

## 2017-09-24 DIAGNOSIS — M25462 Effusion, left knee: Secondary | ICD-10-CM | POA: Diagnosis not present

## 2017-09-24 DIAGNOSIS — M25662 Stiffness of left knee, not elsewhere classified: Secondary | ICD-10-CM | POA: Diagnosis not present

## 2017-09-26 DIAGNOSIS — R002 Palpitations: Secondary | ICD-10-CM | POA: Diagnosis not present

## 2017-09-29 DIAGNOSIS — M25462 Effusion, left knee: Secondary | ICD-10-CM | POA: Diagnosis not present

## 2017-09-29 DIAGNOSIS — M25662 Stiffness of left knee, not elsewhere classified: Secondary | ICD-10-CM | POA: Diagnosis not present

## 2017-09-29 DIAGNOSIS — M25562 Pain in left knee: Secondary | ICD-10-CM | POA: Diagnosis not present

## 2017-09-29 DIAGNOSIS — R269 Unspecified abnormalities of gait and mobility: Secondary | ICD-10-CM | POA: Diagnosis not present

## 2017-10-01 DIAGNOSIS — M25662 Stiffness of left knee, not elsewhere classified: Secondary | ICD-10-CM | POA: Diagnosis not present

## 2017-10-01 DIAGNOSIS — M25562 Pain in left knee: Secondary | ICD-10-CM | POA: Diagnosis not present

## 2017-10-01 DIAGNOSIS — M25462 Effusion, left knee: Secondary | ICD-10-CM | POA: Diagnosis not present

## 2017-10-01 DIAGNOSIS — R269 Unspecified abnormalities of gait and mobility: Secondary | ICD-10-CM | POA: Diagnosis not present

## 2017-10-03 DIAGNOSIS — M25662 Stiffness of left knee, not elsewhere classified: Secondary | ICD-10-CM | POA: Diagnosis not present

## 2017-10-03 DIAGNOSIS — M25562 Pain in left knee: Secondary | ICD-10-CM | POA: Diagnosis not present

## 2017-10-03 DIAGNOSIS — M25462 Effusion, left knee: Secondary | ICD-10-CM | POA: Diagnosis not present

## 2017-10-03 DIAGNOSIS — R269 Unspecified abnormalities of gait and mobility: Secondary | ICD-10-CM | POA: Diagnosis not present

## 2017-10-06 ENCOUNTER — Ambulatory Visit (INDEPENDENT_AMBULATORY_CARE_PROVIDER_SITE_OTHER): Payer: BLUE CROSS/BLUE SHIELD | Admitting: Orthopaedic Surgery

## 2017-10-06 ENCOUNTER — Encounter (INDEPENDENT_AMBULATORY_CARE_PROVIDER_SITE_OTHER): Payer: Self-pay | Admitting: Orthopaedic Surgery

## 2017-10-06 DIAGNOSIS — Z96652 Presence of left artificial knee joint: Secondary | ICD-10-CM

## 2017-10-06 NOTE — Progress Notes (Signed)
[name] is just shy of 3 months status post left total knee replacement.  Overall she is doing well and not taking any pain medicines.  Her main complaint is swelling.  She is progressing with physical therapy.  Her surgical scar is fully healed.  Calf is nontender.  She does have mild swelling of her entire extremity.  Range of motion is 0-95 weeks.  From my standpoint her main thing that limits her is from the swelling.  We did try a month of Lasix but that did not make a huge difference.  I recommend compression hose during the day and elevation and ice as needed.  Reassurance is given.  I will see her back in 3 months with three-view x-rays of the left knee.

## 2017-10-08 DIAGNOSIS — Z9889 Other specified postprocedural states: Secondary | ICD-10-CM | POA: Diagnosis not present

## 2017-10-08 DIAGNOSIS — Z0189 Encounter for other specified special examinations: Secondary | ICD-10-CM | POA: Diagnosis not present

## 2017-10-08 DIAGNOSIS — I480 Paroxysmal atrial fibrillation: Secondary | ICD-10-CM | POA: Diagnosis not present

## 2017-10-08 DIAGNOSIS — Z8679 Personal history of other diseases of the circulatory system: Secondary | ICD-10-CM | POA: Diagnosis not present

## 2017-10-09 DIAGNOSIS — M25662 Stiffness of left knee, not elsewhere classified: Secondary | ICD-10-CM | POA: Diagnosis not present

## 2017-10-09 DIAGNOSIS — M25562 Pain in left knee: Secondary | ICD-10-CM | POA: Diagnosis not present

## 2017-10-09 DIAGNOSIS — R269 Unspecified abnormalities of gait and mobility: Secondary | ICD-10-CM | POA: Diagnosis not present

## 2017-10-09 DIAGNOSIS — M25462 Effusion, left knee: Secondary | ICD-10-CM | POA: Diagnosis not present

## 2017-10-13 DIAGNOSIS — M25462 Effusion, left knee: Secondary | ICD-10-CM | POA: Diagnosis not present

## 2017-10-13 DIAGNOSIS — M25662 Stiffness of left knee, not elsewhere classified: Secondary | ICD-10-CM | POA: Diagnosis not present

## 2017-10-13 DIAGNOSIS — R269 Unspecified abnormalities of gait and mobility: Secondary | ICD-10-CM | POA: Diagnosis not present

## 2017-10-13 DIAGNOSIS — I480 Paroxysmal atrial fibrillation: Secondary | ICD-10-CM | POA: Diagnosis not present

## 2017-10-13 DIAGNOSIS — M25562 Pain in left knee: Secondary | ICD-10-CM | POA: Diagnosis not present

## 2017-10-20 DIAGNOSIS — R269 Unspecified abnormalities of gait and mobility: Secondary | ICD-10-CM | POA: Diagnosis not present

## 2017-10-20 DIAGNOSIS — M25462 Effusion, left knee: Secondary | ICD-10-CM | POA: Diagnosis not present

## 2017-10-20 DIAGNOSIS — M25562 Pain in left knee: Secondary | ICD-10-CM | POA: Diagnosis not present

## 2017-10-20 DIAGNOSIS — M25662 Stiffness of left knee, not elsewhere classified: Secondary | ICD-10-CM | POA: Diagnosis not present

## 2017-10-24 DIAGNOSIS — M25462 Effusion, left knee: Secondary | ICD-10-CM | POA: Diagnosis not present

## 2017-10-24 DIAGNOSIS — M25562 Pain in left knee: Secondary | ICD-10-CM | POA: Diagnosis not present

## 2017-10-24 DIAGNOSIS — M25662 Stiffness of left knee, not elsewhere classified: Secondary | ICD-10-CM | POA: Diagnosis not present

## 2017-10-24 DIAGNOSIS — R269 Unspecified abnormalities of gait and mobility: Secondary | ICD-10-CM | POA: Diagnosis not present

## 2017-11-02 ENCOUNTER — Other Ambulatory Visit (INDEPENDENT_AMBULATORY_CARE_PROVIDER_SITE_OTHER): Payer: Self-pay | Admitting: Orthopaedic Surgery

## 2017-11-03 NOTE — Telephone Encounter (Signed)
deny

## 2017-11-23 ENCOUNTER — Other Ambulatory Visit (INDEPENDENT_AMBULATORY_CARE_PROVIDER_SITE_OTHER): Payer: Self-pay | Admitting: Orthopaedic Surgery

## 2017-11-24 NOTE — Telephone Encounter (Signed)
Ok to refill 

## 2017-11-24 NOTE — Telephone Encounter (Signed)
NO refill

## 2017-12-02 ENCOUNTER — Ambulatory Visit (INDEPENDENT_AMBULATORY_CARE_PROVIDER_SITE_OTHER): Payer: BLUE CROSS/BLUE SHIELD

## 2017-12-02 ENCOUNTER — Encounter (INDEPENDENT_AMBULATORY_CARE_PROVIDER_SITE_OTHER): Payer: Self-pay | Admitting: Orthopaedic Surgery

## 2017-12-02 ENCOUNTER — Ambulatory Visit (INDEPENDENT_AMBULATORY_CARE_PROVIDER_SITE_OTHER): Payer: BLUE CROSS/BLUE SHIELD | Admitting: Orthopaedic Surgery

## 2017-12-02 DIAGNOSIS — Z96652 Presence of left artificial knee joint: Secondary | ICD-10-CM

## 2017-12-02 DIAGNOSIS — G8929 Other chronic pain: Secondary | ICD-10-CM

## 2017-12-02 DIAGNOSIS — M545 Low back pain: Secondary | ICD-10-CM | POA: Diagnosis not present

## 2017-12-02 DIAGNOSIS — M25562 Pain in left knee: Secondary | ICD-10-CM | POA: Diagnosis not present

## 2017-12-02 MED ORDER — TRAMADOL HCL 50 MG PO TABS: ORAL_TABLET | ORAL | 0 refills | Status: DC

## 2017-12-02 MED ORDER — PREDNISONE 10 MG (21) PO TBPK: ORAL_TABLET | ORAL | 0 refills | Status: DC

## 2017-12-02 NOTE — Progress Notes (Signed)
Post-Op Visit Note   Patient: [name]           Date of Birth: 06-25-1961           MRN: [id number] Visit Date: 12/02/2017 PCP: Chesley Noon, MD   Assessment & Plan:  Chief Complaint:  Chief Complaint  Patient presents with  . Left Knee - Pain   Visit Diagnoses:  1. Chronic pain of left knee   2. S/P TKR (total knee replacement), left   3. Low back pain, unspecified back pain laterality, unspecified chronicity, with sciatica presence unspecified     Plan: Patient comes in today 5 months status post left total knee replacement, date of surgery 07/10/2017.  She has struggled with swelling since.  She she has been wearing thigh-high compression stockings and elevating for swelling.  She does admit that she works a full-time job where she goes from seated to standing and walking positions throughout the entire day.  She also notes a burning sensation to the lateral aspect of the left lower leg.  She notes increased instability where she feels as though her left knee is going to give way.  She was in outpatient physical therapy where she was making great progress but she did stop going due to financial reasons once her insurance stopped paying for this.  Examination of the left knee reveals moderate swelling throughout.  Marked tenderness over the pes bursa.  No joint line tenderness.  Range of motion from 0-100 degrees.  She is stable to valgus and varus stress.  Her calf is soft and nontender.  She is neurovascularly intact distally.  At this point, I feel as though the patient needs to continue working on strengthening exercises.  She says that she will start going to the gym to work on this more often.  We will also back down to part-time work for the next 8 weeks.  I will call in a prescription for a steroid Dosepak.  She will follow-up with [place] in 8 weeks time for recheck.  Follow-Up Instructions: Return in about 8 weeks (around 01/27/2018).   Orders:  Orders Placed This  Encounter  Procedures  . XR Lumbar Spine 2-3 Views  . XR KNEE 3 VIEW LEFT   Meds ordered this encounter  Medications  . traMADol (ULTRAM) 50 MG tablet    Sig: TAKE 1-2 TAB PO Q6-8 HOURS PRN PAIN    Dispense:  30 tablet    Refill:  0  . predniSONE (STERAPRED UNI-PAK 21 TAB) 10 MG (21) TBPK tablet    Sig: Take as directed    Dispense:  21 tablet    Refill:  0    Imaging: Xr Knee 3 View Left  Result Date: 12/02/2017 X-rays of the left knee reveal a well-seated prosthesis without evidence of subsidence or osteolysis  Xr Lumbar Spine 2-3 Views  Result Date: 12/02/2017 X-rays of the lumbar spine reveal marked anterior spurring L1-2   PMFS History: Patient Active Problem List   Diagnosis Date Noted  . S/P TKR (total knee replacement), left 07/10/2017  . Unilateral primary osteoarthritis, left knee 06/23/2017  . Chondromalacia of left patella 01/16/2017  . TOBACCO ABUSE 10/25/2008  . PALPITATIONS 10/25/2008  . DYSPNEA 10/25/2008   Past Medical History:  Diagnosis Date  . Anxiety   . Arthritis   . Dysrhythmia    hx of afib, afib ablation in 01/2014, no afib since then  . Pneumonia    as a child  History reviewed. No pertinent family history.  Past Surgical History:  Procedure Laterality Date  . ABDOMINAL HYSTERECTOMY    . BLADDER SURGERY  2000  . CARDIAC ELECTROPHYSIOLOGY MAPPING AND ABLATION     for afib  . CESAREAN SECTION    . CHOLECYSTECTOMY    . fallopian tube removal    . loop recorder explant  2015  . LOOP RECORDER IMPLANT  2015  . TONSILLECTOMY AND ADENOIDECTOMY    . TOTAL KNEE ARTHROPLASTY Left 07/10/2017   Procedure: LEFT TOTAL KNEE ARTHROPLASTY;  Surgeon: Leandrew Koyanagi, MD;  Location: [facility];  Service: Orthopedics;  Laterality: Left;  Marland Kitchen VAGINAL HYSTERECTOMY     Social History   Occupational History  . Occupation: Glass blower/designer  Tobacco Use  . Smoking status: Former Smoker    Packs/day: 0.50    Types: Cigarettes    Last attempt to quit:  07/10/2017    Years since quitting: 0.3  . Smokeless tobacco: Never Used  . Tobacco comment: STATES SHE QUIT SMOKING 5 MONTHS AGO.  Substance and Sexual Activity  . Alcohol use: No    Frequency: Never  . Drug use: No  . Sexual activity: Yes    Birth control/protection: Surgical

## 2018-01-05 ENCOUNTER — Ambulatory Visit (INDEPENDENT_AMBULATORY_CARE_PROVIDER_SITE_OTHER): Payer: BLUE CROSS/BLUE SHIELD | Admitting: Orthopaedic Surgery

## 2018-01-27 ENCOUNTER — Ambulatory Visit (INDEPENDENT_AMBULATORY_CARE_PROVIDER_SITE_OTHER): Payer: BLUE CROSS/BLUE SHIELD | Admitting: Orthopaedic Surgery

## 2018-01-27 ENCOUNTER — Encounter (INDEPENDENT_AMBULATORY_CARE_PROVIDER_SITE_OTHER): Payer: Self-pay | Admitting: Orthopaedic Surgery

## 2018-01-27 ENCOUNTER — Ambulatory Visit (INDEPENDENT_AMBULATORY_CARE_PROVIDER_SITE_OTHER): Payer: BLUE CROSS/BLUE SHIELD

## 2018-01-27 DIAGNOSIS — Z96652 Presence of left artificial knee joint: Secondary | ICD-10-CM | POA: Diagnosis not present

## 2018-01-27 NOTE — Progress Notes (Signed)
Office Visit Note   Patient: [name]           Date of Birth: 05-Nov-1960           MRN: [id number] Visit Date: 01/27/2018              Requested by: Chesley Noon, MD [place], [place] [id number] PCP: Chesley Noon, MD   Assessment & Plan: Visit Diagnoses:  1. S/P TKR (total knee replacement), left     Plan: Patient is doing well for her six-month visit.  Overall she is doing well.  She has resumed her normal activities.  She no longer limps.  Dental prophylaxis was reinforced.  Follow-up in 5 months with 2 view x-rays of the left knee.  Follow-Up Instructions: Return in about 5 months (around 06/29/2018).   Orders:  Orders Placed This Encounter  Procedures  . XR Knee 1-2 Views Left   No orders of the defined types were placed in this encounter.     Procedures: No procedures performed   Clinical Data: No additional findings.   Subjective: Chief Complaint  Patient presents with  . Left Knee - Follow-up    07/10/17 Left TKA    Patient is 7 months status post left total knee replacement.  Overall she is doing well.  She still endorses some swelling of her entire left lower extremity.  She takes Xarelto for atrial fibrillation.   Review of Systems   Objective: Vital Signs: There were no vitals taken for this visit.  Physical Exam  Ortho Exam Left knee exam shows a fully healed surgical scar.  She has mild swelling of the entire left lower extremity.  Range of motion is excellent. Specialty Comments:  No specialty comments available.  Imaging: Xr Knee 1-2 Views Left  Result Date: 01/27/2018 Stable appearance of left total knee replacement without any evidence of loosening or complications.    PMFS History: Patient Active Problem List   Diagnosis Date Noted  . S/P TKR (total knee replacement), left 07/10/2017  . Unilateral primary osteoarthritis, left knee 06/23/2017  . Chondromalacia of left patella 01/16/2017  . TOBACCO  ABUSE 10/25/2008  . PALPITATIONS 10/25/2008  . DYSPNEA 10/25/2008   Past Medical History:  Diagnosis Date  . Anxiety   . Arthritis   . Dysrhythmia    hx of afib, afib ablation in 01/2014, no afib since then  . Pneumonia    as a child    History reviewed. No pertinent family history.  Past Surgical History:  Procedure Laterality Date  . ABDOMINAL HYSTERECTOMY    . BLADDER SURGERY  2000  . CARDIAC ELECTROPHYSIOLOGY MAPPING AND ABLATION     for afib  . CESAREAN SECTION    . CHOLECYSTECTOMY    . fallopian tube removal    . loop recorder explant  2015  . LOOP RECORDER IMPLANT  2015  . TONSILLECTOMY AND ADENOIDECTOMY    . TOTAL KNEE ARTHROPLASTY Left 07/10/2017   Procedure: LEFT TOTAL KNEE ARTHROPLASTY;  Surgeon: Leandrew Koyanagi, MD;  Location: [facility];  Service: Orthopedics;  Laterality: Left;  Marland Kitchen VAGINAL HYSTERECTOMY     Social History   Occupational History  . Occupation: Glass blower/designer  Tobacco Use  . Smoking status: Former Smoker    Packs/day: 0.50    Types: Cigarettes    Last attempt to quit: 07/10/2017    Years since quitting: 0.5  . Smokeless tobacco: Never Used  . Tobacco comment: STATES SHE QUIT  SMOKING 5 MONTHS AGO.  Substance and Sexual Activity  . Alcohol use: No    Frequency: Never  . Drug use: No  . Sexual activity: Yes    Birth control/protection: Surgical

## 2018-02-23 ENCOUNTER — Emergency Department (HOSPITAL_COMMUNITY): Payer: BLUE CROSS/BLUE SHIELD

## 2018-02-23 ENCOUNTER — Inpatient Hospital Stay (HOSPITAL_COMMUNITY)
Admission: EM | Admit: 2018-02-23 | Discharge: 2018-02-27 | DRG: 392 | Disposition: A | Discharge status: Home / Self Care | Payer: BLUE CROSS/BLUE SHIELD | Attending: Family Medicine | Admitting: Family Medicine

## 2018-02-23 ENCOUNTER — Encounter (HOSPITAL_COMMUNITY): Payer: Self-pay | Admitting: *Deleted

## 2018-02-23 ENCOUNTER — Other Ambulatory Visit: Payer: Self-pay

## 2018-02-23 ENCOUNTER — Ambulatory Visit (HOSPITAL_COMMUNITY)
Admission: EM | Admit: 2018-02-23 | Discharge: 2018-02-23 | Disposition: A | Discharge status: Home / Self Care | Payer: BLUE CROSS/BLUE SHIELD | Source: Home / Self Care

## 2018-02-23 ENCOUNTER — Encounter (HOSPITAL_COMMUNITY): Payer: Self-pay

## 2018-02-23 DIAGNOSIS — R159 Full incontinence of feces: Secondary | ICD-10-CM | POA: Diagnosis present

## 2018-02-23 DIAGNOSIS — R1030 Lower abdominal pain, unspecified: Secondary | ICD-10-CM

## 2018-02-23 DIAGNOSIS — R109 Unspecified abdominal pain: Secondary | ICD-10-CM | POA: Diagnosis not present

## 2018-02-23 DIAGNOSIS — R1032 Left lower quadrant pain: Secondary | ICD-10-CM

## 2018-02-23 DIAGNOSIS — Z95818 Presence of other cardiac implants and grafts: Secondary | ICD-10-CM

## 2018-02-23 DIAGNOSIS — I4891 Unspecified atrial fibrillation: Secondary | ICD-10-CM | POA: Diagnosis present

## 2018-02-23 DIAGNOSIS — Z9049 Acquired absence of other specified parts of digestive tract: Secondary | ICD-10-CM | POA: Diagnosis not present

## 2018-02-23 DIAGNOSIS — M1990 Unspecified osteoarthritis, unspecified site: Secondary | ICD-10-CM | POA: Diagnosis present

## 2018-02-23 DIAGNOSIS — K5720 Diverticulitis of large intestine with perforation and abscess without bleeding: Principal | ICD-10-CM | POA: Diagnosis present

## 2018-02-23 DIAGNOSIS — Z79899 Other long term (current) drug therapy: Secondary | ICD-10-CM | POA: Diagnosis not present

## 2018-02-23 DIAGNOSIS — K5792 Diverticulitis of intestine, part unspecified, without perforation or abscess without bleeding: Secondary | ICD-10-CM | POA: Diagnosis not present

## 2018-02-23 DIAGNOSIS — E860 Dehydration: Secondary | ICD-10-CM | POA: Diagnosis present

## 2018-02-23 DIAGNOSIS — R402413 Glasgow coma scale score 13-15, at hospital admission: Secondary | ICD-10-CM | POA: Diagnosis present

## 2018-02-23 DIAGNOSIS — Z96652 Presence of left artificial knee joint: Secondary | ICD-10-CM | POA: Diagnosis present

## 2018-02-23 DIAGNOSIS — R32 Unspecified urinary incontinence: Secondary | ICD-10-CM | POA: Diagnosis present

## 2018-02-23 DIAGNOSIS — Z7901 Long term (current) use of anticoagulants: Secondary | ICD-10-CM

## 2018-02-23 DIAGNOSIS — J9811 Atelectasis: Secondary | ICD-10-CM | POA: Diagnosis present

## 2018-02-23 DIAGNOSIS — Z87891 Personal history of nicotine dependence: Secondary | ICD-10-CM

## 2018-02-23 DIAGNOSIS — R195 Other fecal abnormalities: Secondary | ICD-10-CM | POA: Diagnosis present

## 2018-02-23 DIAGNOSIS — K625 Hemorrhage of anus and rectum: Secondary | ICD-10-CM

## 2018-02-23 DIAGNOSIS — I480 Paroxysmal atrial fibrillation: Secondary | ICD-10-CM | POA: Diagnosis present

## 2018-02-23 DIAGNOSIS — K921 Melena: Secondary | ICD-10-CM | POA: Diagnosis not present

## 2018-02-23 DIAGNOSIS — F419 Anxiety disorder, unspecified: Secondary | ICD-10-CM | POA: Diagnosis present

## 2018-02-23 DIAGNOSIS — K5732 Diverticulitis of large intestine without perforation or abscess without bleeding: Secondary | ICD-10-CM | POA: Diagnosis not present

## 2018-02-23 HISTORY — DX: Unspecified atrial fibrillation: I48.91

## 2018-02-23 LAB — TYPE AND SCREEN
ABO/RH(D): A POS
Antibody Screen: NEGATIVE

## 2018-02-23 LAB — CBC
HCT: 44.9 % (ref 36.0–46.0)
Hemoglobin: 14.6 g/dL (ref 12.0–15.0)
MCH: 31.3 pg (ref 26.0–34.0)
MCHC: 32.5 g/dL (ref 30.0–36.0)
MCV: 96.1 fL (ref 78.0–100.0)
PLATELETS: 228 10*3/uL (ref 150–400)
RBC: 4.67 MIL/uL (ref 3.87–5.11)
RDW: 12.7 % (ref 11.5–15.5)
WBC: 13.6 10*3/uL — AB (ref 4.0–10.5)

## 2018-02-23 LAB — URINALYSIS, ROUTINE W REFLEX MICROSCOPIC
Bilirubin Urine: NEGATIVE
GLUCOSE, UA: NEGATIVE mg/dL
KETONES UR: NEGATIVE mg/dL
Leukocytes, UA: NEGATIVE
Nitrite: NEGATIVE
Protein, ur: NEGATIVE mg/dL
Specific Gravity, Urine: 1.01 (ref 1.005–1.030)
pH: 5 (ref 5.0–8.0)

## 2018-02-23 LAB — COMPREHENSIVE METABOLIC PANEL
ALBUMIN: 3.7 g/dL (ref 3.5–5.0)
ALT: 14 U/L (ref 0–44)
ANION GAP: 10 (ref 5–15)
AST: 16 U/L (ref 15–41)
Alkaline Phosphatase: 112 U/L (ref 38–126)
BUN: 8 mg/dL (ref 6–20)
CO2: 25 mmol/L (ref 22–32)
Calcium: 9 mg/dL (ref 8.9–10.3)
Chloride: 103 mmol/L (ref 98–111)
Creatinine, Ser: 0.69 mg/dL (ref 0.44–1.00)
GFR calc Af Amer: >60 mL/min (ref >60)
GFR calc non Af Amer: >60 mL/min (ref >60)
GLUCOSE: 88 mg/dL (ref 70–99)
POTASSIUM: 4.1 mmol/L (ref 3.5–5.1)
SODIUM: 138 mmol/L (ref 135–145)
TOTAL PROTEIN: 7.1 g/dL (ref 6.5–8.1)
Total Bilirubin: 1.5 mg/dL — ABNORMAL HIGH (ref 0.3–1.2)

## 2018-02-23 LAB — LIPASE, BLOOD: Lipase: 23 U/L (ref 11–51)

## 2018-02-23 LAB — I-STAT CG4 LACTIC ACID, ED: Lactic Acid, Venous: 0.96 mmol/L (ref 0.5–1.9)

## 2018-02-23 LAB — POC OCCULT BLOOD, ED: FECAL OCCULT BLD: POSITIVE — AB

## 2018-02-23 MED ORDER — MORPHINE SULFATE (PF) 4 MG/ML IV SOLN
1.0000 mg | INTRAVENOUS | Status: DC | PRN
  Administered 2018-02-24: 2 mg via INTRAVENOUS
  Filled 2018-02-23: qty 1

## 2018-02-23 MED ORDER — ONDANSETRON HCL 4 MG/2ML IJ SOLN
4.0000 mg | Freq: Four times a day (QID) | INTRAMUSCULAR | Status: DC | PRN
  Administered 2018-02-24: 4 mg via INTRAVENOUS
  Filled 2018-02-23: qty 2

## 2018-02-23 MED ORDER — FLECAINIDE ACETATE 50 MG PO TABS
50.0000 mg | ORAL_TABLET | Freq: Two times a day (BID) | ORAL | Status: DC
  Administered 2018-02-23 – 2018-02-27 (×8): 50 mg via ORAL
  Filled 2018-02-23 (×8): qty 1

## 2018-02-23 MED ORDER — IOHEXOL 300 MG/ML  SOLN
100.0000 mL | Freq: Once | INTRAMUSCULAR | Status: AC | PRN
  Administered 2018-02-23: 100 mL via INTRAVENOUS

## 2018-02-23 MED ORDER — PROMETHAZINE HCL 25 MG/ML IJ SOLN: 25.0000 mg | Freq: Four times a day (QID) | INTRAMUSCULAR | Status: DC | PRN

## 2018-02-23 MED ORDER — PIPERACILLIN-TAZOBACTAM 3.375 G IVPB
3.3750 g | Freq: Three times a day (TID) | INTRAVENOUS | Status: DC
  Administered 2018-02-23 – 2018-02-27 (×12): 3.375 g via INTRAVENOUS
  Filled 2018-02-23 (×13): qty 50

## 2018-02-23 MED ORDER — SODIUM CHLORIDE 0.9 % IV SOLN
INTRAVENOUS | Status: DC
  Administered 2018-02-23 – 2018-02-27 (×9): via INTRAVENOUS

## 2018-02-23 MED ORDER — PIPERACILLIN-TAZOBACTAM 3.375 G IVPB 30 MIN
3.3750 g | Freq: Once | INTRAVENOUS | Status: AC
  Administered 2018-02-23: 3.375 g via INTRAVENOUS
  Filled 2018-02-23: qty 50

## 2018-02-23 MED ORDER — SODIUM CHLORIDE 0.9% FLUSH
3.0000 mL | Freq: Two times a day (BID) | INTRAVENOUS | Status: DC
Start: 2018-02-23 — End: 2018-02-27
  Administered 2018-02-24 – 2018-02-27 (×4): 3 mL via INTRAVENOUS

## 2018-02-23 MED ORDER — ACETAMINOPHEN 325 MG PO TABS
650.0000 mg | ORAL_TABLET | Freq: Four times a day (QID) | ORAL | Status: DC | PRN
  Administered 2018-02-23 – 2018-02-26 (×2): 650 mg via ORAL
  Filled 2018-02-23 (×2): qty 2

## 2018-02-23 MED ORDER — TRAMADOL HCL 50 MG PO TABS
50.0000 mg | ORAL_TABLET | Freq: Four times a day (QID) | ORAL | Status: DC | PRN
Start: 2018-02-23 — End: 2018-02-27
  Administered 2018-02-23 – 2018-02-25 (×5): 50 mg via ORAL
  Filled 2018-02-23 (×5): qty 1

## 2018-02-23 MED ORDER — ACETAMINOPHEN 650 MG RE SUPP: 650.0000 mg | Freq: Four times a day (QID) | RECTAL | Status: DC | PRN

## 2018-02-23 NOTE — ED Notes (Signed)
Phlebotomy at bedside.

## 2018-02-23 NOTE — ED Provider Notes (Signed)
[facility] EMERGENCY DEPARTMENT Provider Note   CSN: [id number] Arrival date & time: 02/23/18  1155     History   Chief Complaint Chief Complaint  Patient presents with  . Abdominal Pain  . Rectal Bleeding    HPI [name] is a 57 y.o. female.  The history is provided by the patient and medical records.  Abdominal Pain   This is a new problem. The current episode started yesterday. The problem occurs constantly. The pain is associated with an unknown factor. The pain is located in the suprapubic region and LLQ. The quality of the pain is aching, sharp and cramping. The pain is at a severity of 8/10. The pain is severe. Associated symptoms include flatus and constipation. Pertinent negatives include fever, diarrhea, nausea, vomiting, dysuria, frequency and headaches. The symptoms are aggravated by palpation. Nothing relieves the symptoms.    Past Medical History:  Diagnosis Date  . A-fib ([place])   . Anxiety   . Arthritis   . Dysrhythmia    hx of afib, afib ablation in 01/2014, no afib since then  . Pneumonia    as a child    Patient Active Problem List   Diagnosis Date Noted  . S/P TKR (total knee replacement), left 07/10/2017  . Unilateral primary osteoarthritis, left knee 06/23/2017  . Chondromalacia of left patella 01/16/2017  . TOBACCO ABUSE 10/25/2008  . PALPITATIONS 10/25/2008  . DYSPNEA 10/25/2008    Past Surgical History:  Procedure Laterality Date  . ABDOMINAL HYSTERECTOMY    . BLADDER SURGERY  2000  . CARDIAC ELECTROPHYSIOLOGY MAPPING AND ABLATION     for afib  . CESAREAN SECTION    . CHOLECYSTECTOMY    . fallopian tube removal    . loop recorder explant  2015  . LOOP RECORDER IMPLANT  2015  . TONSILLECTOMY AND ADENOIDECTOMY    . TOTAL KNEE ARTHROPLASTY Left 07/10/2017   Procedure: LEFT TOTAL KNEE ARTHROPLASTY;  Surgeon: Leandrew Koyanagi, MD;  Location: [facility];  Service: Orthopedics;  Laterality: Left;  Marland Kitchen VAGINAL HYSTERECTOMY        OB History   None      Home Medications    Prior to Admission medications   Medication Sig Start Date End Date Taking? Authorizing Provider  ALPRAZolam Duanne Moron) 0.5 MG tablet Take 0.25 mg by mouth daily as needed for anxiety.    [provider]  flecainide (TAMBOCOR) 50 MG tablet Take 50 mg by mouth 2 (two) times daily.    [provider]  metoprolol tartrate (LOPRESSOR) 25 MG tablet Take 25 mg by mouth 2 (two) times daily. 10/31/17   [provider]  XARELTO 20 MG TABS tablet  12/01/17   [provider]    Family History No family history on file.  Social History Social History   Tobacco Use  . Smoking status: Former Smoker    Packs/day: 0.50    Types: Cigarettes    Last attempt to quit: 07/10/2017    Years since quitting: 0.6  . Smokeless tobacco: Never Used  . Tobacco comment: STATES SHE QUIT SMOKING 5 MONTHS AGO.  Substance Use Topics  . Alcohol use: No    Frequency: Never  . Drug use: No     Allergies   Patient has no known allergies.   Review of Systems Review of Systems  Constitutional: Negative for chills, fatigue and fever.  HENT: Negative for congestion.   Respiratory: Negative for cough, chest tightness and shortness of  breath.   Cardiovascular: Negative for chest pain and palpitations.  Gastrointestinal: Positive for abdominal distention, abdominal pain, constipation and flatus. Negative for diarrhea, nausea and vomiting.  Genitourinary: Negative for dysuria, flank pain and frequency.  Musculoskeletal: Negative for back pain, neck pain and neck stiffness.  Skin: Negative for rash and wound.  Neurological: Negative for light-headedness, numbness and headaches.  Psychiatric/Behavioral: Negative for agitation and confusion.  All other systems reviewed and are negative.    Physical Exam Updated Vital Signs BP 96/61 (BP Location: Right Arm)   Pulse 87   Temp 99.1 F (37.3 C) (Oral)   Ht 5\' 6"  (1.676 m)   Wt  93.4 kg (206 lb)   SpO2 98%   BMI 33.25 kg/m   Physical Exam  Constitutional: She appears well-developed and well-nourished. No distress.  HENT:  Head: Normocephalic and atraumatic.  Mouth/Throat: Oropharynx is clear and moist. No oropharyngeal exudate.  Eyes: Pupils are equal, round, and reactive to light. Conjunctivae and EOM are normal.  Neck: Normal range of motion. Neck supple.  Cardiovascular: Normal rate and regular rhythm.  No murmur heard. Pulmonary/Chest: Effort normal and breath sounds normal. No respiratory distress. She has no wheezes. She has no rales. She exhibits no tenderness.  Abdominal: Soft. Normal appearance. Bowel sounds are decreased. There is tenderness in the suprapubic area and left lower quadrant. There is no rigidity, no rebound, no guarding and no CVA tenderness.  Musculoskeletal: She exhibits no edema or tenderness.  Neurological: She is alert. No sensory deficit. She exhibits normal muscle tone.  Skin: Skin is warm and dry. Capillary refill takes less than 2 seconds. No rash noted. She is not diaphoretic. No erythema.  Psychiatric: She has a normal mood and affect.  Nursing note and vitals reviewed.    ED Treatments / Results  Labs (all labs ordered are listed, but only abnormal results are displayed) Labs Reviewed  COMPREHENSIVE METABOLIC PANEL - Abnormal; Notable for the following components:      Result Value   Total Bilirubin 1.5 (*)    All other components within normal limits  CBC - Abnormal; Notable for the following components:   WBC 13.6 (*)    All other components within normal limits  URINALYSIS, ROUTINE W REFLEX MICROSCOPIC - Abnormal; Notable for the following components:   Hgb urine dipstick SMALL (*)    Bacteria, UA RARE (*)    All other components within normal limits  POC OCCULT BLOOD, ED - Abnormal; Notable for the following components:   Fecal Occult Bld POSITIVE (*)    All other components within normal limits  URINE CULTURE   CULTURE, BLOOD (ROUTINE X 2)  CULTURE, BLOOD (ROUTINE X 2)  LIPASE, BLOOD  HIV ANTIBODY (ROUTINE TESTING)  I-STAT CG4 LACTIC ACID, ED  TYPE AND SCREEN    EKG None  Radiology Ct Abdomen Pelvis W Contrast  Result Date: 02/23/2018 CLINICAL DATA:  Lower abdominal pain with onset of rectal bleeding last night. EXAM: CT ABDOMEN AND PELVIS WITH CONTRAST TECHNIQUE: Multidetector CT imaging of the abdomen and pelvis was performed using the standard protocol following bolus administration of intravenous contrast. CONTRAST:  100 ml OMNIPAQUE IOHEXOL 300 MG/ML  SOLN COMPARISON:  None. FINDINGS: Lower chest: There is cardiomegaly. No pericardial effusion. No pleural effusion. Dependent atelectasis is noted. Hepatobiliary: No focal liver abnormality is seen. Status post cholecystectomy. No biliary dilatation. Pancreas: Unremarkable. No pancreatic ductal dilatation or surrounding inflammatory changes. Spleen: Normal in size without focal abnormality. Adrenals/Urinary Tract: Adrenal glands  are unremarkable. Kidneys are normal, without renal calculi, focal lesion, or hydronephrosis. Bladder is unremarkable. Stomach/Bowel: The patient has sigmoid diverticulosis with extensive stranding about the mid sigmoid colon consistent with acute diverticulitis. A small fluid collection measuring 1.8 cm transverse by 1.7 cm AP by 2.7 cm craniocaudal in the inferior aspect of the right side of the pelvis appears may be a small amount of sympathetic fluid or could be phlegmon. No free intraperitoneal air is identified. The appendix is not identified and may have been removed. The stomach and small bowel are unremarkable. Vascular/Lymphatic: Aortic atherosclerosis. No enlarged abdominal or pelvic lymph nodes. Reproductive: Status post hysterectomy. No adnexal masses. Other: None. Musculoskeletal: No acute or focal abnormality. Degenerative disease about the hips is worse on the left. IMPRESSION: Diverticulosis with superimposed acute  sigmoid diverticulitis. Small volume of fluid in the inferior aspect of the right pelvis appears simple but could represent phlegmon. Negative for drainable fluid collection. Cardiomegaly. Atherosclerosis. Electronically Signed   By: Inge Rise M.D.   On: 02/23/2018 15:11    Procedures Procedures (including critical care time)  Medications Ordered in ED Medications  piperacillin-tazobactam (ZOSYN) IVPB 3.375 g (has no administration in time range)  iohexol (OMNIPAQUE) 300 MG/ML solution 100 mL (100 mLs Intravenous Contrast Given 02/23/18 1449)     Initial Impression / Assessment and Plan / ED Course  I have reviewed the triage vital signs and the nursing notes.  Pertinent labs & imaging results that were available during my care of the patient were reviewed by me and considered in my medical decision making (see chart for details).     [name] is a 57 y.o. female with a past medical history significant for a "kinked colon", atrial fibrillation on Xarelto therapy, and anxiety who presents from an urgent care for fevers, chills, abdominal pain, decreased flatus, rectal bleeding, and abdominal distention.  Patient reports that she has had multiple abdominal surgeries in the past including bladder surgery, appendectomy, hysterectomy, and cholecystectomy.  She says that she has never had an obstruction in the past before.  She is unsure if he is ever had diverticulitis.  She reports that yesterday she began having abdominal pain and pressure.  She reports that she felt that she needed to have a BM but could not do so.  She reports that overnight she had a very small amount of red blood when wiping after bowel movement attempt.  She reports she has not passed gas since yesterday.  She reports her  abdomen has swollen and she is very tender across her entire lower abdomen.  She denies any pelvic pain or vaginal symptoms.  She denies any trauma.  She reports that her temperature was 101.3  yesterday.  She took Tylenol and it has improved.  She reports her pain goes up to an 8 out of 10 severity and is cramping and sharp in nature.  She denies nausea or vomiting.  She says it is currently 3 out of 10.  She reports no dysuria, hematuria, or urinary symptoms.  She denies any sick contacts and denies other complaints on arrival.  At urgent care they transferred her for possible diverticulitis or obstruction evaluation.  On my exam, patient does have diffuse tenderness across her lower abdomen.  No CVA tenderness or back tenderness present.  Lungs are clear and chest is nontender.  Rectal exam was performed and I did not see gross blood or evidence of external hemorrhoids.  On my evaluation, patient was not tachycardic  and her blood pressure was normotensive.  Patient is on room air and resting comfortably.  Patient did not want pain medicine initially.  Clinical I am concerned about obstruction given the patient's abdominal distention, pain, history of surgeries, and decreased flatus.  With her small rectal bleeding am concerned that she is on Xarelto.  Patient will have laboratory testing performed as well as a CT scan to look for etiology of her discomfort.  Anticipate reassessment after work-up.  3:45 PM Diagnostic testing began to return.  Patient has no evidence of UTI on urinalysis.  Fecal occult was positive.  Lactic acid was normal.  Metabolic panel showed slight elevation in bilirubin but was otherwise unremarkable.  Leukocytosis present of 13.6.  CT scan showed evidence of a sigmoid diverticulitis with adjacent phlegmon.  They did not say there was perforation or abscess.  They did not comment on obstruction however clinically I am concerned about her lack of flatus.    General surgery was called who agreed with antibiotics and medical admission for further management.   Zosyn was given and blood cultures obtained.  Medicine team will be called for admission for sigmoid  diverticulitis, possible obstruction, and rectal bleeding on Xarelto.   Final Clinical Impressions(s) / ED Diagnoses   Final diagnoses:  Sigmoid diverticulitis  Rectal bleeding     Clinical Impression: 1. Sigmoid diverticulitis   2. Rectal bleeding     Disposition: Admit  This note was prepared with assistance of Dragon voice recognition software. Occasional wrong-word or sound-a-like substitutions may have occurred due to the inherent limitations of voice recognition software.      Kendel Bessey, Gwenyth Allegra, MD 02/23/18 [phone]

## 2018-02-23 NOTE — ED Notes (Signed)
Patient transported to CT 

## 2018-02-23 NOTE — ED Notes (Signed)
Dr. Sherry Ruffing at bedside at this time.

## 2018-02-23 NOTE — Discharge Instructions (Addendum)
Please report to the ER immediately as you will need a CT scan to rule out diverticulitis, acute abdomen.

## 2018-02-23 NOTE — ED Triage Notes (Signed)
Pt  Presents with severe abdominal cramps and rectal bleeding .

## 2018-02-23 NOTE — Consult Note (Signed)
[facility] Surgery Consult/Admission Note  [name] 17-Oct-1960  [id number].    Requesting MD: Dr. Sherry Ruffing Chief Complaint/Reason for Consult: diverticulitis  HPI:   Pt is a 57 yo female with a history of rate controlled a. Fib on Xarelto, current smoker who presented to the ED with LLQ abdominal pain. Pt states hx of 2 attempted colonoscopy's. She states the GI doctor stated she had a "kinked colon" and he was unable to pass the pediatric scope through. She does not have normal BM's and they tend to be loose. Pt stated LLQ/suprapubic cramping started yesterday around 1600. Pain progressively worsened today to severe, non radiating, with associated fever (101F at home), chills and fatigue. Small amount of blood mixed with mucus from rectum since onset of symptoms. Since arrival to the hospital she has been incontinent of urine. No history of diverticulitis. Hx of C section and abdominal hysterectomy.  ROS:  Review of Systems  Constitutional: Positive for chills, fever and malaise/fatigue. Negative for diaphoresis.  HENT: Negative for sore throat.   Respiratory: Negative for cough and shortness of breath.   Cardiovascular: Negative for chest pain.  Gastrointestinal: Positive for abdominal pain. Negative for blood in stool, constipation, diarrhea, nausea and vomiting.       + blood per rectum  Genitourinary: Negative for dysuria.  Skin: Negative for rash.  Neurological: Negative for dizziness and loss of consciousness.  All other systems reviewed and are negative.    History reviewed. No pertinent family history.  Past Medical History:  Diagnosis Date  . A-fib ([place])   . Anxiety   . Arthritis   . Dysrhythmia    hx of afib, afib ablation in 01/2014, no afib since then  . Pneumonia    as a child    Past Surgical History:  Procedure Laterality Date  . ABDOMINAL HYSTERECTOMY    . BLADDER SURGERY  2000  . CARDIAC ELECTROPHYSIOLOGY MAPPING AND ABLATION     for afib  .  CESAREAN SECTION    . CHOLECYSTECTOMY    . fallopian tube removal    . loop recorder explant  2015  . LOOP RECORDER IMPLANT  2015  . TONSILLECTOMY AND ADENOIDECTOMY    . TOTAL KNEE ARTHROPLASTY Left 07/10/2017   Procedure: LEFT TOTAL KNEE ARTHROPLASTY;  Surgeon: Leandrew Koyanagi, MD;  Location: [place];  Service: Orthopedics;  Laterality: Left;  Marland Kitchen VAGINAL HYSTERECTOMY      Social History:  reports that she quit smoking about 7 months ago. Her smoking use included cigarettes. She smoked 0.50 packs per day. She has never used smokeless tobacco. She reports that she does not drink alcohol or use drugs.  Allergies: No Known Allergies   (Not in a hospital admission)  Blood pressure 106/67, pulse 76, temperature 99.1 F (37.3 C), temperature source Oral, resp. rate (!) 22, height '5\' 6"'  (1.676 m), weight 93.4 kg (206 lb), SpO2 99 %.  Physical Exam  Constitutional: She is oriented to person, place, and time. She appears well-developed and well-nourished.  Non-toxic appearance. She does not appear ill. No distress.  HENT:  Head: Normocephalic and atraumatic.  Nose: Nose normal.  Mouth/Throat: Oropharynx is clear and moist. No oropharyngeal exudate.  Eyes: Pupils are equal, round, and reactive to light. Conjunctivae are normal. Right eye exhibits no discharge. Left eye exhibits no discharge. No scleral icterus.  Neck: Normal range of motion. Neck supple. No thyromegaly present.  Cardiovascular: Normal rate, regular rhythm, normal heart sounds and intact distal pulses.  No  murmur heard. Pulses:      Radial pulses are 2+ on the right side, and 2+ on the left side.       Dorsalis pedis pulses are 2+ on the right side, and 2+ on the left side.  Pulmonary/Chest: Effort normal and breath sounds normal. No respiratory distress. She has no wheezes. She has no rhonchi. She has no rales.  Abdominal: Soft. Normal appearance and bowel sounds are normal. She exhibits no distension. There is no  hepatosplenomegaly. There is tenderness in the suprapubic area and left lower quadrant. There is guarding. There is no rigidity.  Well healed midline, suprapubic, vertical scar  Musculoskeletal: Normal range of motion. She exhibits no edema, tenderness or deformity.  Lymphadenopathy:    She has no cervical adenopathy.  Neurological: She is alert and oriented to person, place, and time.  Skin: Skin is warm and dry. No rash noted. She is not diaphoretic.  Psychiatric: She has a normal mood and affect.  Nursing note and vitals reviewed.   Results for orders placed or performed during the hospital encounter of 02/23/18 (from the past 48 hour(s))  Type and screen [place]     Status: None   Collection Time: 02/23/18 12:25 PM  Result Value Ref Range   ABO/RH(D) A POS    Antibody Screen NEG    Sample Expiration      02/26/2018 Performed at [facility], [place] [address] [place], [place] [address]   Comprehensive metabolic panel     Status: Abnormal   Collection Time: 02/23/18 12:33 PM  Result Value Ref Range   Sodium 138 135 - 145 mmol/L   Potassium 4.1 3.5 - 5.1 mmol/L   Chloride 103 98 - 111 mmol/L    Comment: Please note change in reference range.   CO2 25 22 - 32 mmol/L   Glucose, Bld 88 70 - 99 mg/dL    Comment: Please note change in reference range.   BUN 8 6 - 20 mg/dL    Comment: Please note change in reference range.   Creatinine, Ser 0.69 0.44 - 1.00 mg/dL   Calcium 9.0 8.9 - 10.3 mg/dL   Total Protein 7.1 6.5 - 8.1 g/dL   Albumin 3.7 3.5 - 5.0 g/dL   AST 16 15 - 41 U/L   ALT 14 0 - 44 U/L    Comment: Please note change in reference range.   Alkaline Phosphatase 112 38 - 126 U/L   Total Bilirubin 1.5 (H) 0.3 - 1.2 mg/dL   GFR calc non Af Amer >60 >60 mL/min   GFR calc Af Amer >60 >60 mL/min    Comment: (NOTE) The eGFR has been calculated using the CKD EPI equation. This calculation has not been validated in all clinical situations. eGFR's  persistently <60 mL/min signify possible Chronic Kidney Disease.    Anion gap 10 5 - 15    Comment: Performed at [facility] [address], [place], [place] [address]  CBC     Status: Abnormal   Collection Time: 02/23/18 12:33 PM  Result Value Ref Range   WBC 13.6 (H) 4.0 - 10.5 K/uL   RBC 4.67 3.87 - 5.11 MIL/uL   Hemoglobin 14.6 12.0 - 15.0 g/dL   HCT 44.9 36.0 - 46.0 %   MCV 96.1 78.0 - 100.0 fL   MCH 31.3 26.0 - 34.0 pg   MCHC 32.5 30.0 - 36.0 g/dL   RDW 12.7 11.5 - 15.5 %  Platelets 228 150 - 400 K/uL    Comment: Performed at [facility], [place] [address], [place], [place] [address]  Lipase, blood     Status: None   Collection Time: 02/23/18 12:33 PM  Result Value Ref Range   Lipase 23 11 - 51 U/L    Comment: Performed at [facility] [address] [place], [place] [address]  I-Stat CG4 Lactic Acid, ED     Status: None   Collection Time: 02/23/18 12:55 PM  Result Value Ref Range   Lactic Acid, Venous 0.96 0.5 - 1.9 mmol/L  POC occult blood, ED     Status: Abnormal   Collection Time: 02/23/18  1:54 PM  Result Value Ref Range   Fecal Occult Bld POSITIVE (A) NEGATIVE  Urinalysis, Routine w reflex microscopic     Status: Abnormal   Collection Time: 02/23/18  2:20 PM  Result Value Ref Range   Color, Urine YELLOW YELLOW   APPearance CLEAR CLEAR   Specific Gravity, Urine 1.010 1.005 - 1.030   pH 5.0 5.0 - 8.0   Glucose, UA NEGATIVE NEGATIVE mg/dL   Hgb urine dipstick SMALL (A) NEGATIVE   Bilirubin Urine NEGATIVE NEGATIVE   Ketones, ur NEGATIVE NEGATIVE mg/dL   Protein, ur NEGATIVE NEGATIVE mg/dL   Nitrite NEGATIVE NEGATIVE   Leukocytes, UA NEGATIVE NEGATIVE   RBC / HPF 0-5 0 - 5 RBC/hpf   WBC, UA 0-5 0 - 5 WBC/hpf   Bacteria, UA RARE (A) NONE SEEN   Squamous Epithelial / LPF 0-5 0 - 5   Mucus PRESENT     Comment: Performed at [facility], [address], [place], [place] [address]   Ct Abdomen Pelvis W Contrast  Result Date:  02/23/2018 CLINICAL DATA:  Lower abdominal pain with onset of rectal bleeding last night. EXAM: CT ABDOMEN AND PELVIS WITH CONTRAST TECHNIQUE: Multidetector CT imaging of the abdomen and pelvis was performed using the standard protocol following bolus administration of intravenous contrast. CONTRAST:  100 ml OMNIPAQUE IOHEXOL 300 MG/ML  SOLN COMPARISON:  None. FINDINGS: Lower chest: There is cardiomegaly. No pericardial effusion. No pleural effusion. Dependent atelectasis is noted. Hepatobiliary: No focal liver abnormality is seen. Status post cholecystectomy. No biliary dilatation. Pancreas: Unremarkable. No pancreatic ductal dilatation or surrounding inflammatory changes. Spleen: Normal in size without focal abnormality. Adrenals/Urinary Tract: Adrenal glands are unremarkable. Kidneys are normal, without renal calculi, focal lesion, or hydronephrosis. Bladder is unremarkable. Stomach/Bowel: The patient has sigmoid diverticulosis with extensive stranding about the mid sigmoid colon consistent with acute diverticulitis. A small fluid collection measuring 1.8 cm transverse by 1.7 cm AP by 2.7 cm craniocaudal in the inferior aspect of the right side of the pelvis appears may be a small amount of sympathetic fluid or could be phlegmon. No free intraperitoneal air is identified. The appendix is not identified and may have been removed. The stomach and small bowel are unremarkable. Vascular/Lymphatic: Aortic atherosclerosis. No enlarged abdominal or pelvic lymph nodes. Reproductive: Status post hysterectomy. No adnexal masses. Other: None. Musculoskeletal: No acute or focal abnormality. Degenerative disease about the hips is worse on the left. IMPRESSION: Diverticulosis with superimposed acute sigmoid diverticulitis. Small volume of fluid in the inferior aspect of the right pelvis appears simple but could represent phlegmon. Negative for drainable fluid collection. Cardiomegaly. Atherosclerosis. Electronically Signed    By: Inge Rise M.D.   On: 02/23/2018 15:11      Assessment/Plan  A.fib on Xarelto  Sigmoid Diverticulitis -  recommend NPO, IVF, IV abx  - ideally this resolves without need for surgical intervention.  - will attempt to get colonoscopy results from Lamount Cohen, MD with [facility]   Thank you for the consult. We will follow.   Kalman Drape, [facility] Surgery 02/23/2018, 4:03 PM Pager: [phone] Consults: [phone] Mon-Fri 7:00 am-4:30 pm Sat-Sun 7:00 am-11:30 am

## 2018-02-23 NOTE — H&P (Addendum)
History and Physical    [name] DJS:[id number] DOB: 02-13-1961 DOA: 02/23/2018  **Will admit patient based on the expectation that the patient will need hospitalization/ hospital care that crosses at least 2 midnights  PCP: Velna Hatchet, MD   Attending physician: Lorin Mercy  Patient coming from/Resides with: Private residence/husband  Chief Complaint: Abdominal pain, blood in stool  HPI: [name] is a 57 y.o. female with medical history significant for AF on Tambocor and Xarelto, anxiety disorder, history of inability to complete colonoscopy secondary to redundant colon.  Patient reports that yesterday she developed left lower quadrant pain, sensation of needing to pass bowel movement but unable to pass at times.  Also stools with clotted blood.  She is also had issues with urinary incontinence.  She has had fever up to 101.3 F.  Because of severity of symptoms she presented to the ER.  Patient did have leukocytosis 13,600.  CT the abdomen and pelvis revealed acute diverticulitis involving phlegmon in the right pelvis but no drainable fluid collection.  Patient has been given a dose of Zosyn in the ER and blood cultures been obtained.  She is been evaluated by surgery and currently there are no acute surgical issues.  Start of medical therapy to include bowel rest recommended.  ED Course:  Vital Signs: BP 106/67   Pulse 76   Temp 99.1 F (37.3 C) (Oral)   Resp (!) 22   Ht 5\' 6"  (1.676 m)   Wt 93.4 kg (206 lb)   SpO2 99%   BMI 33.25 kg/m  CT abdomen and pelvis: As above Lab data: Sodium 138, potassium 4.1, chloride 103, CO2 25, glucose 88, BUN 8, creatinine 0.69, LFTs normal except for mildly elevated total bilirubin 1.5, lactic acid normal, white count 13,600 differential not obtained, hemoglobin 14.6, platelets 228,000 Medications and treatments: Zosyn 3.375 g x 1  Review of Systems:  In addition to the HPI above,  No Headache, changes with Vision or hearing, new  weakness, tingling, numbness in any extremity, dizziness, dysarthria or word finding difficulty, gait disturbance or imbalance, tremors or seizure activity No problems swallowing food or Liquids, indigestion/reflux, choking or coughing while eating, abdominal pain with or after eating No Chest pain, Cough or Shortness of Breath, palpitations, orthopnea or DOE No N/V, dark tarry stools, constipation No dysuria, malodorous urine, hematuria or flank pain No new skin rashes, lesions, masses or bruises, No new joint pains, aches, swelling or redness No recent unintentional weight gain or loss No polyuria, polydypsia or polyphagia   Past Medical History:  Diagnosis Date  . A-fib ([place])   . Anxiety   . Arthritis   . Dysrhythmia    hx of afib, afib ablation in 01/2014, no afib since then  . Pneumonia    as a child    Past Surgical History:  Procedure Laterality Date  . ABDOMINAL HYSTERECTOMY    . BLADDER SURGERY  2000  . CARDIAC ELECTROPHYSIOLOGY MAPPING AND ABLATION     for afib  . CESAREAN SECTION    . CHOLECYSTECTOMY    . fallopian tube removal    . loop recorder explant  2015  . LOOP RECORDER IMPLANT  2015  . TONSILLECTOMY AND ADENOIDECTOMY    . TOTAL KNEE ARTHROPLASTY Left 07/10/2017   Procedure: LEFT TOTAL KNEE ARTHROPLASTY;  Surgeon: Leandrew Koyanagi, MD;  Location: [facility];  Service: Orthopedics;  Laterality: Left;  Marland Kitchen VAGINAL HYSTERECTOMY      Social History   Socioeconomic History  .  Marital status: Married    Spouse name: Not on file  . Number of children: Not on file  . Years of education: Not on file  . Highest education level: Not on file  Occupational History  . Occupation: Glass blower/designer  Social Needs  . Financial resource strain: Not on file  . Food insecurity:    Worry: Not on file    Inability: Not on file  . Transportation needs:    Medical: Not on file    Non-medical: Not on file  Tobacco Use  . Smoking status: Former Smoker    Packs/day: 0.50     Types: Cigarettes    Last attempt to quit: 07/10/2017    Years since quitting: 0.6  . Smokeless tobacco: Never Used  . Tobacco comment: STATES SHE QUIT SMOKING 5 MONTHS AGO.  Substance and Sexual Activity  . Alcohol use: No    Frequency: Never  . Drug use: No  . Sexual activity: Yes    Birth control/protection: Surgical  Lifestyle  . Physical activity:    Days per week: Not on file    Minutes per session: Not on file  . Stress: Not on file  Relationships  . Social connections:    Talks on phone: Not on file    Gets together: Not on file    Attends religious service: Not on file    Active member of club or organization: Not on file    Attends meetings of clubs or organizations: Not on file    Relationship status: Not on file  . Intimate partner violence:    Fear of current or ex partner: Not on file    Emotionally abused: Not on file    Physically abused: Not on file    Forced sexual activity: Not on file  Other Topics Concern  . Not on file  Social History Narrative  . Not on file    Mobility: Independent Work history: Not obtained   No Known Allergies  History reviewed. No pertinent family history.   Prior to Admission medications   Medication Sig Start Date End Date Taking? Authorizing Provider  acetaminophen (TYLENOL) 500 MG tablet Take 1,000 mg by mouth every 6 (six) hours as needed for mild pain or headache.   Yes [provider]  ALPRAZolam Duanne Moron) 0.5 MG tablet Take 0.25 mg by mouth daily as needed for anxiety.   Yes [provider]  flecainide (TAMBOCOR) 50 MG tablet Take 50 mg by mouth 2 (two) times daily.   Yes [provider]  Melatonin 10 MG TABS Take 10 mg by mouth at bedtime as needed (for sleep).    Yes [provider]  metoprolol tartrate (LOPRESSOR) 25 MG tablet Take 25 mg by mouth 2 (two) times daily. 10/31/17  Yes [provider]  senna-docusate (SENOKOT-S) 8.6-50 MG tablet Take 1 tablet by mouth as  needed for mild constipation.   Yes [provider]  XARELTO 20 MG TABS tablet Take 20 mg by mouth daily with supper.  12/01/17  Yes [provider]    Physical Exam: Vitals:   02/23/18 1340 02/23/18 1400 02/23/18 1415 02/23/18 1500  BP: 109/72 107/73 104/71 106/67  Pulse: 73 73 73 76  Resp:  (!) 22 19 (!) 22  Temp:      TempSrc:      SpO2: 99% 98% 98% 99%  Weight:      Height:          Constitutional: NAD, calm, comfortable  Eyes: PERRL, lids and conjunctivae normal ENMT: Mucous membranes are dry. Posterior pharynx clear of any exudate or lesions.Normal dentition.  Neck: normal, supple, no masses, no thyromegaly Respiratory: clear to auscultation bilaterally, no wheezing, no crackles. Normal respiratory effort. No accessory muscle use.  Cardiovascular: Regular rate and rhythm, no murmurs / rubs / gallops. No extremity edema. 2+ pedal pulses. No carotid bruits.  Abdomen: Soft and nondistended with focal LLQ tenderness, no masses palpated. No hepatosplenomegaly. Bowel sounds positive hypoactive.  FOB positive Musculoskeletal: no clubbing / cyanosis. No joint deformity upper and lower extremities. Good ROM, no contractures. Normal muscle tone.  Skin: no rashes, lesions, ulcers. No induration Neurologic: CN 2-12 grossly intact. Sensation intact, DTR normal. Strength 5/5 x all 4 extremities.  Psychiatric: Normal judgment and insight. Alert and oriented x 3. Normal mood.    Labs on Admission: I have personally reviewed following labs and imaging studies  CBC: Recent Labs  Lab 02/23/18 1233  WBC 13.6*  HGB 14.6  HCT 44.9  MCV 96.1  PLT 563   Basic Metabolic Panel: Recent Labs  Lab 02/23/18 1233  NA 138  K 4.1  CL 103  CO2 25  GLUCOSE 88  BUN 8  CREATININE 0.69  CALCIUM 9.0   GFR: Estimated Creatinine Clearance: 87.1 mL/min (by C-G formula based on SCr of 0.69 mg/dL). Liver Function Tests: Recent Labs  Lab 02/23/18 1233  AST 16  ALT 14  ALKPHOS  112  BILITOT 1.5*  PROT 7.1  ALBUMIN 3.7   Recent Labs  Lab 02/23/18 1233  LIPASE 23   No results for input(s): AMMONIA in the last 168 hours. Coagulation Profile: No results for input(s): INR, PROTIME in the last 168 hours. Cardiac Enzymes: No results for input(s): CKTOTAL, CKMB, CKMBINDEX, TROPONINI in the last 168 hours. BNP (last 3 results) No results for input(s): PROBNP in the last 8760 hours. HbA1C: No results for input(s): HGBA1C in the last 72 hours. CBG: No results for input(s): GLUCAP in the last 168 hours. Lipid Profile: No results for input(s): CHOL, HDL, LDLCALC, TRIG, CHOLHDL, LDLDIRECT in the last 72 hours. Thyroid Function Tests: No results for input(s): TSH, T4TOTAL, FREET4, T3FREE, THYROIDAB in the last 72 hours. Anemia Panel: No results for input(s): VITAMINB12, FOLATE, FERRITIN, TIBC, IRON, RETICCTPCT in the last 72 hours. Urine analysis:    Component Value Date/Time   COLORURINE YELLOW 02/23/2018 1420   APPEARANCEUR CLEAR 02/23/2018 1420   LABSPEC 1.010 02/23/2018 1420   PHURINE 5.0 02/23/2018 1420   GLUCOSEU NEGATIVE 02/23/2018 1420   HGBUR SMALL (A) 02/23/2018 1420   BILIRUBINUR NEGATIVE 02/23/2018 1420   KETONESUR NEGATIVE 02/23/2018 1420   PROTEINUR NEGATIVE 02/23/2018 1420   UROBILINOGEN 0.2 09/12/2008 2319   NITRITE NEGATIVE 02/23/2018 1420   LEUKOCYTESUR NEGATIVE 02/23/2018 1420   Sepsis Labs: @LABRCNTIP (procalcitonin:4,lacticidven:4) )No results found for this or any previous visit (from the past 240 hour(s)).   Radiological Exams on Admission: Ct Abdomen Pelvis W Contrast  Result Date: 02/23/2018 CLINICAL DATA:  Lower abdominal pain with onset of rectal bleeding last night. EXAM: CT ABDOMEN AND PELVIS WITH CONTRAST TECHNIQUE: Multidetector CT imaging of the abdomen and pelvis was performed using the standard protocol following bolus administration of intravenous contrast. CONTRAST:  100 ml OMNIPAQUE IOHEXOL 300 MG/ML  SOLN COMPARISON:   None. FINDINGS: Lower chest: There is cardiomegaly. No pericardial effusion. No pleural effusion. Dependent atelectasis is noted. Hepatobiliary: No focal liver abnormality is seen. Status post cholecystectomy. No biliary dilatation. Pancreas: Unremarkable. No pancreatic  ductal dilatation or surrounding inflammatory changes. Spleen: Normal in size without focal abnormality. Adrenals/Urinary Tract: Adrenal glands are unremarkable. Kidneys are normal, without renal calculi, focal lesion, or hydronephrosis. Bladder is unremarkable. Stomach/Bowel: The patient has sigmoid diverticulosis with extensive stranding about the mid sigmoid colon consistent with acute diverticulitis. A small fluid collection measuring 1.8 cm transverse by 1.7 cm AP by 2.7 cm craniocaudal in the inferior aspect of the right side of the pelvis appears may be a small amount of sympathetic fluid or could be phlegmon. No free intraperitoneal air is identified. The appendix is not identified and may have been removed. The stomach and small bowel are unremarkable. Vascular/Lymphatic: Aortic atherosclerosis. No enlarged abdominal or pelvic lymph nodes. Reproductive: Status post hysterectomy. No adnexal masses. Other: None. Musculoskeletal: No acute or focal abnormality. Degenerative disease about the hips is worse on the left. IMPRESSION: Diverticulosis with superimposed acute sigmoid diverticulitis. Small volume of fluid in the inferior aspect of the right pelvis appears simple but could represent phlegmon. Negative for drainable fluid collection. Cardiomegaly. Atherosclerosis. Electronically Signed   By: Inge Rise M.D.   On: 02/23/2018 15:11    EKG: (Independently reviewed) sinus tachycardia with ventricular rate 107 bpm, QTC 453 ms, normal R wave rotation, no acute ischemic changes  Assessment/Plan Principal Problem:   Acute diverticulitis  -Patient presents with acute abdominal pain, fever and leukocytosis with CT evidence of  diverticulosis with acute sigmoid diverticulitis and associated evolving right pelvic phlegmon -Follow-up on blood cultures obtained in ER -IV Zosyn empirically -N.p.o. except for sips of liquid with oral medications -IV morphine for pain -No NSAIDs at present in context of heme positive stool -IV Zofran/Phenergan for nausea -Abdomen soft without clinical/CT evidence of obstruction -Appreciate surgery assistance, no acute surgical needs identified at this time  Active Problems:   Heme positive stool -Noted with heme positive stools in ER and the patient on chronic anticoagulation with Xarelto -Hold Xarelto for now -Monitor for more brisk bleeding    Urinary incontinence -Likely secondary to location of phlegmon-surgeon agrees -I have asked nursing staff to place pure wick urinary collection device and patient -Latter scan every shift with option of I's/O catheterization every 8 hours prn for urinary retention    A-fib  -Currently maintaining sinus rhythm on Tambocor-continue -Holding anticoagulation for now-likely can resume once inflammation and acute diverticulitis improved    Anxiety    **Additional lab, imaging and/or diagnostic evaluation at discretion of supervising physician  DVT prophylaxis: SCDs Code Status: Full Family Communication: Husband Disposition Plan: Home Consults called: Surgery/[place]    Sedonia Kitner L. ANP-BC [facility] Pager [phone]   If 7PM-7AM, please contact night-coverage [email or web address] Password [facility]  02/23/2018, 4:13 PM

## 2018-02-23 NOTE — ED Provider Notes (Signed)
MRN: [id number] DOB: 1961-06-08  Subjective:   [name] is a 57 y.o. female presenting for 1 day history of lower abdominal/pelvic. Pain is squeezing type sensation, intermittent, feels like she needs to use the bathroom. Used stool softener to address constipation as a source of her discomfort. Reports that she had a bowel movement yesterday without any issue. Had a fever yesterday, 101.25F, relieved with 2 APAP. Had bright red blood per rectum this morning and decided to come in to get checked. Denies anal pain, history of hemorrhoids, diverticulosis, diverticulitis. Reports that she has had 2 attempts at a colonoscopy but was unable to have these completed due to a stricture as seen during procedures. Patient is not sexually active, her husband has ED.   No current facility-administered medications for this encounter.   Current Outpatient Medications:  .  ALPRAZolam (XANAX) 0.5 MG tablet, Take 0.25 mg by mouth daily as needed for anxiety., Disp: , Rfl:  .  flecainide (TAMBOCOR) 50 MG tablet, Take 50 mg by mouth 2 (two) times daily., Disp: , Rfl:  .  metoprolol tartrate (LOPRESSOR) 25 MG tablet, Take 25 mg by mouth 2 (two) times daily., Disp: , Rfl: 3 .  XARELTO 20 MG TABS tablet, , Disp: , Rfl: 2    No Known Allergies   Past Medical History:  Diagnosis Date  . Anxiety   . Arthritis   . Dysrhythmia    hx of afib, afib ablation in 01/2014, no afib since then  . Pneumonia    as a child     Past Surgical History:  Procedure Laterality Date  . ABDOMINAL HYSTERECTOMY    . BLADDER SURGERY  2000  . CARDIAC ELECTROPHYSIOLOGY MAPPING AND ABLATION     for afib  . CESAREAN SECTION    . CHOLECYSTECTOMY    . fallopian tube removal    . loop recorder explant  2015  . LOOP RECORDER IMPLANT  2015  . TONSILLECTOMY AND ADENOIDECTOMY    . TOTAL KNEE ARTHROPLASTY Left 07/10/2017   Procedure: LEFT TOTAL KNEE ARTHROPLASTY;  Surgeon: Leandrew Koyanagi, MD;  Location: [facility];  Service: Orthopedics;   Laterality: Left;  Marland Kitchen VAGINAL HYSTERECTOMY      Objective:   Vitals: BP 96/60 (BP Location: Left Arm)   Pulse 83   Temp 99.3 F (37.4 C) (Temporal)   Resp 20   SpO2 98%   Physical Exam  Constitutional: She is oriented to person, place, and time. She appears well-developed and well-nourished.  Non-toxic appearance. No distress.  HENT:  Mouth/Throat: Oropharynx is clear and moist.  Cardiovascular: Normal rate, regular rhythm and intact distal pulses. Exam reveals no gallop and no friction rub.  No murmur heard. Pulmonary/Chest: No respiratory distress. She has no wheezes. She has no rales.  Abdominal: There is no hepatosplenomegaly. There is tenderness (exquisite, worse over LLQ) in the right lower quadrant and left lower quadrant. There is guarding. There is no rigidity, no rebound, no CVA tenderness, no tenderness at McBurney's point and negative Murphy's sign.  Genitourinary:     Neurological: She is alert and oriented to person, place, and time.  Skin: Skin is warm and dry.  Psychiatric: She has a normal mood and affect.   Assessment and Plan :   Abdominal pain, left lower quadrant  Lower abdominal pain  Will refer to ER immediately to r/o acute abdomen, diverticulitis given physical exam findings, HPI of fever, brbpr. Patient is in agreement with treatment plan.   Jaynee Eagles, PA-C  02/23/18 1154  

## 2018-02-23 NOTE — Progress Notes (Signed)
Pharmacy Antibiotic Note  [name] is a 57 y.o. female admitted on 02/23/2018 with intra-abdominal infection.  Pharmacy has been consulted for Zosyn dosing.  Plan: Start Zosyn 3.375 gm IV q8h (4 hour infusion) Monitor clinical picture, renal function F/U C&S, abx deescalation / LOT   Height: 5\' 6"  (167.6 cm) Weight: 206 lb (93.4 kg) IBW/kg (Calculated) : 59.3  Temp (24hrs), Avg:99.2 F (37.3 C), Min:99.1 F (37.3 C), Max:99.3 F (37.4 C)  Recent Labs  Lab 02/23/18 1233 02/23/18 1255  WBC 13.6*  --   CREATININE 0.69  --   LATICACIDVEN  --  0.96    Estimated Creatinine Clearance: 87.1 mL/min (by C-G formula based on SCr of 0.69 mg/dL).    No Known Allergies   Thank you for allowing pharmacy to be a part of this patient's care.  Reginia Naas 02/23/2018 4:14 PM

## 2018-02-23 NOTE — ED Triage Notes (Signed)
PT sent here from UC for lower abdominal pressure, as if she has to have a bm, but she is unable to have bm.  However, pt did see bright red blood and yellow mucus on TP.

## 2018-02-23 NOTE — Progress Notes (Signed)
Patient arrived to unit, alert oriented with stable vitals signs. No evidence of skin breakdown. Patient complaining of mild discomfort. Tylenol given. Patient oriented to unit and resting

## 2018-02-23 NOTE — ED Notes (Signed)
Dr. Lorin Mercy at bedside at this time.

## 2018-02-23 NOTE — ED Notes (Signed)
Attempted to call report to [facility] x 1 unsuccessfully. Left name & phone number for receiving RN to return call for report.  

## 2018-02-23 NOTE — ED Notes (Signed)
Pt returned from CT °

## 2018-02-24 LAB — BASIC METABOLIC PANEL
ANION GAP: 14 (ref 5–15)
BUN: 7 mg/dL (ref 6–20)
CALCIUM: 8.8 mg/dL — AB (ref 8.9–10.3)
CO2: 22 mmol/L (ref 22–32)
CREATININE: 0.71 mg/dL (ref 0.44–1.00)
Chloride: 104 mmol/L (ref 98–111)
GLUCOSE: 65 mg/dL — AB (ref 70–99)
Potassium: 3.6 mmol/L (ref 3.5–5.1)
Sodium: 140 mmol/L (ref 135–145)

## 2018-02-24 LAB — URINE CULTURE: Culture: NO GROWTH

## 2018-02-24 LAB — CBC
HCT: 40.1 % (ref 36.0–46.0)
HEMOGLOBIN: 13.2 g/dL (ref 12.0–15.0)
MCH: 31.1 pg (ref 26.0–34.0)
MCHC: 32.9 g/dL (ref 30.0–36.0)
MCV: 94.4 fL (ref 78.0–100.0)
PLATELETS: 176 10*3/uL (ref 150–400)
RBC: 4.25 MIL/uL (ref 3.87–5.11)
RDW: 12.6 % (ref 11.5–15.5)
WBC: 8.6 10*3/uL (ref 4.0–10.5)

## 2018-02-24 MED ORDER — ALPRAZOLAM 0.25 MG PO TABS
0.2500 mg | ORAL_TABLET | Freq: Every day | ORAL | Status: DC | PRN
  Administered 2018-02-26: 0.25 mg via ORAL
  Filled 2018-02-24: qty 1

## 2018-02-24 MED ORDER — MELATONIN 3 MG PO TABS
9.0000 mg | ORAL_TABLET | Freq: Every evening | ORAL | Status: DC | PRN
  Administered 2018-02-24 – 2018-02-26 (×3): 9 mg via ORAL
  Filled 2018-02-24 (×4): qty 3

## 2018-02-24 MED ORDER — MELATONIN 10 MG PO TABS: 10.0000 mg | ORAL_TABLET | Freq: Every evening | ORAL | Status: DC | PRN

## 2018-02-24 MED ORDER — METOPROLOL TARTRATE 25 MG PO TABS
25.0000 mg | ORAL_TABLET | Freq: Two times a day (BID) | ORAL | Status: DC
  Administered 2018-02-24 – 2018-02-26 (×4): 25 mg via ORAL
  Filled 2018-02-24 (×5): qty 1

## 2018-02-24 NOTE — Progress Notes (Signed)
PROGRESS NOTE    [name]  QMG:[id number] DOB: [birth year] DOA: 02/23/2018 PCP: Velna Hatchet, MD     Brief Narrative:  [name] is a 57 y.o. female with medical history significant for AF on Tambocor, metoprolol, and Xarelto, anxiety disorder, history of inability to complete colonoscopy secondary to redundant colon.  Patient reports that yesterday she developed left lower quadrant pain, sensation of needing to pass bowel movement but unable to pass at times.  Also stools with clotted blood.  She is also had issues with urinary incontinence.  She has had fever up to 101.3 F.  Because of severity of symptoms she presented to the ER.  Patient did have leukocytosis 13,600.  CT the abdomen and pelvis revealed acute diverticulitis involving phlegmon in the right pelvis but no drainable fluid collection.  Patient has been given a dose of Zosyn in the ER and blood cultures been obtained.  She is been evaluated by surgery and currently there are no acute surgical issues.  Start of medical therapy to include bowel rest recommended.  New events last 24 hours / Subjective: Patient without any BM since Sunday. Pain is intermittent LLQ but well controlled and improving. No N/V.   Assessment & Plan:   Principal Problem:   Acute diverticulitis Active Problems:   A-fib (HCC)   Anxiety   Heme positive stool   Urinary incontinence   Acute sigmoid diverticulitis  -Patient presents with acute abdominal pain, fever and leukocytosis with CT evidence of diverticulosis with acute sigmoid diverticulitis and associated evolving right pelvic phlegmon -Follow-up on blood cultures  -WBC improving  -IV Zosyn  -IVF -General surgery following -Will advance to clear liquid diet   Heme positive stool -Noted with heme positive stools in ER and the patient on chronic anticoagulation with Xarelto -Hold Xarelto for now -Monitor for more brisk bleeding   Urinary incontinence -Likely secondary to  location of phlegmon -Bladder scan prn   A-fib  -Currently maintaining sinus rhythm on Tambocor, resume metoprolol  -Holding anticoagulation for now. Likely can resume once inflammation and acute diverticulitis improved  Anxiety -Xanax prn    DVT prophylaxis: SCDs, holding xarelto for now Code Status: Full Family Communication: At bedside Disposition Plan: Pending improvement   Consultants:   General surgery  Procedures:   None   Antimicrobials:  Anti-infectives (From admission, onward)   Start     Dose/Rate Route Frequency Ordered Stop   02/23/18 2300  piperacillin-tazobactam (ZOSYN) IVPB 3.375 g     3.375 g 12.5 mL/hr over 240 Minutes Intravenous Every 8 hours 02/23/18 1616     07 /01/19 1545  piperacillin-tazobactam (ZOSYN) IVPB 3.375 g     3.375 g 100 mL/hr over 30 Minutes Intravenous  Once 02/23/18 1530 02/23/18 1848       Objective: Vitals:   02/23/18 1500 02/23/18 1700 02/23/18 2121 02/24/18 0455  BP: 106/67 101/68 91/65 (!) 94/53  Pulse: 76 79 70 84  Resp: (!) 22 18 17 17   Temp:  98.9 F (37.2 C) 98 F (36.7 C) 98.2 F (36.8 C)  TempSrc:  Oral    SpO2: 99% 98% 95% 95%  Weight:  97.9 kg (215 lb 13.3 oz)    Height:  5\' 6"  (1.676 m)      Intake/Output Summary (Last 24 hours) at 02/24/2018 0932 Last data filed at 02/24/2018 0400 Gross per 24 hour  Intake 158.58 ml  Output -  Net 158.58 ml   Filed Weights   02/23/18 1226 02/23/18 1700  Weight:  93.4 kg (206 lb) 97.9 kg (215 lb 13.3 oz)    Examination:  General exam: Appears calm and comfortable  Respiratory system: Clear to auscultation. Respiratory effort normal. Cardiovascular system: S1 & S2 heard, RRR. No JVD, murmurs, rubs, gallops or clicks. No pedal edema. Gastrointestinal system: Abdomen is nondistended, soft and TTP LUQ and LLQ. No organomegaly or masses felt. Diminished bowel sounds heard. Central nervous system: Alert and oriented. No focal neurological deficits. Extremities: Symmetric  5 x 5 power. Skin: No rashes, lesions or ulcers Psychiatry: Judgement and insight appear normal. Mood & affect appropriate.   Data Reviewed: I have personally reviewed following labs and imaging studies  CBC: Recent Labs  Lab 02/23/18 1233 02/24/18 0357  WBC 13.6* 8.6  HGB 14.6 13.2  HCT 44.9 40.1  MCV 96.1 94.4  PLT 228 854   Basic Metabolic Panel: Recent Labs  Lab 02/23/18 1233 02/24/18 0357  NA 138 140  K 4.1 3.6  CL 103 104  CO2 25 22  GLUCOSE 88 65*  BUN 8 7  CREATININE 0.69 0.71  CALCIUM 9.0 8.8*   GFR: Estimated Creatinine Clearance: 89.3 mL/min (by C-G formula based on SCr of 0.71 mg/dL). Liver Function Tests: Recent Labs  Lab 02/23/18 1233  AST 16  ALT 14  ALKPHOS 112  BILITOT 1.5*  PROT 7.1  ALBUMIN 3.7   Recent Labs  Lab 02/23/18 1233  LIPASE 23   No results for input(s): AMMONIA in the last 168 hours. Coagulation Profile: No results for input(s): INR, PROTIME in the last 168 hours. Cardiac Enzymes: No results for input(s): CKTOTAL, CKMB, CKMBINDEX, TROPONINI in the last 168 hours. BNP (last 3 results) No results for input(s): PROBNP in the last 8760 hours. HbA1C: No results for input(s): HGBA1C in the last 72 hours. CBG: No results for input(s): GLUCAP in the last 168 hours. Lipid Profile: No results for input(s): CHOL, HDL, LDLCALC, TRIG, CHOLHDL, LDLDIRECT in the last 72 hours. Thyroid Function Tests: No results for input(s): TSH, T4TOTAL, FREET4, T3FREE, THYROIDAB in the last 72 hours. Anemia Panel: No results for input(s): VITAMINB12, FOLATE, FERRITIN, TIBC, IRON, RETICCTPCT in the last 72 hours. Sepsis Labs: Recent Labs  Lab 02/23/18 1255  LATICACIDVEN 0.96    No results found for this or any previous visit (from the past 240 hour(s)).     Radiology Studies: Ct Abdomen Pelvis W Contrast  Result Date: 02/23/2018 CLINICAL DATA:  Lower abdominal pain with onset of rectal bleeding last night. EXAM: CT ABDOMEN AND PELVIS WITH  CONTRAST TECHNIQUE: Multidetector CT imaging of the abdomen and pelvis was performed using the standard protocol following bolus administration of intravenous contrast. CONTRAST:  100 ml OMNIPAQUE IOHEXOL 300 MG/ML  SOLN COMPARISON:  None. FINDINGS: Lower chest: There is cardiomegaly. No pericardial effusion. No pleural effusion. Dependent atelectasis is noted. Hepatobiliary: No focal liver abnormality is seen. Status post cholecystectomy. No biliary dilatation. Pancreas: Unremarkable. No pancreatic ductal dilatation or surrounding inflammatory changes. Spleen: Normal in size without focal abnormality. Adrenals/Urinary Tract: Adrenal glands are unremarkable. Kidneys are normal, without renal calculi, focal lesion, or hydronephrosis. Bladder is unremarkable. Stomach/Bowel: The patient has sigmoid diverticulosis with extensive stranding about the mid sigmoid colon consistent with acute diverticulitis. A small fluid collection measuring 1.8 cm transverse by 1.7 cm AP by 2.7 cm craniocaudal in the inferior aspect of the right side of the pelvis appears may be a small amount of sympathetic fluid or could be phlegmon. No free intraperitoneal air is identified. The appendix is  not identified and may have been removed. The stomach and small bowel are unremarkable. Vascular/Lymphatic: Aortic atherosclerosis. No enlarged abdominal or pelvic lymph nodes. Reproductive: Status post hysterectomy. No adnexal masses. Other: None. Musculoskeletal: No acute or focal abnormality. Degenerative disease about the hips is worse on the left. IMPRESSION: Diverticulosis with superimposed acute sigmoid diverticulitis. Small volume of fluid in the inferior aspect of the right pelvis appears simple but could represent phlegmon. Negative for drainable fluid collection. Cardiomegaly. Atherosclerosis. Electronically Signed   By: Inge Rise M.D.   On: 02/23/2018 15:11      Scheduled Meds: . flecainide  50 mg Oral BID  . metoprolol  tartrate  25 mg Oral BID  . sodium chloride flush  3 mL Intravenous Q12H   Continuous Infusions: . sodium chloride Stopped (02/23/18 1818)  . piperacillin-tazobactam (ZOSYN)  IV 3.375 g (02/24/18 0553)     LOS: 1 day    Time spent: 25 minutes   Dessa Phi, DO [facility] [email or web address] Password [email or web address] 02/24/2018, 9:32 AM

## 2018-02-24 NOTE — Progress Notes (Signed)
[facility] Progress Note     Subjective: CC:  Rates abd pain as 3/10 with intermittent, lower abd cramping rated 9/10. Passing very small amount of pink mucous per rectum, no BM. Denies flatus. Denies nausea or vomiting. Denies a history of hemorrhoids. Again states her colonoscopy was unsuccessful because of a "kinked colon" - she is having her GI physician in [place] fax over her colonoscopy report to [facility].   Objective: Vital signs in last 24 hours: Temp:  [98 F (36.7 C)-99.3 F (37.4 C)] 98.2 F (36.8 C) (07/02 0455) Pulse Rate:  [70-87] 85 (07/02 0939) Resp:  [17-22] 17 (07/02 0455) BP: (84-118)/(53-98) 112/68 (07/02 0939) SpO2:  [95 %-99 %] 95 % (07/02 0455) Weight:  [93.4 kg (206 lb)-97.9 kg (215 lb 13.3 oz)] 97.9 kg (215 lb 13.3 oz) (07/01 1700) Last BM Date: 02/22/18  Intake/Output from previous day: 07/01 0701 - 07/02 0700 In: 158.6 [I.V.:108; IV Piggyback:50.6] Out: -  Intake/Output this shift: Total I/O In: 50 [IV Piggyback:50] Out: -   PE: Gen:  Alert, NAD, pleasant Card:  Regular rate and rhythm, pedal pulses 2+ BL Pulm:  Normal effort, clear to auscultation bilaterally Abd: Soft, TTP LLW and suprapubic region without peritonitis, non-distended, bowel sounds present in all 4 quadrants, previous lower abdominal incision from C-sect/hysterectomy  Skin: warm and dry, no rashes  Psych: A&Ox3   Lab Results:  Recent Labs    02/23/18 1233 02/24/18 0357  WBC 13.6* 8.6  HGB 14.6 13.2  HCT 44.9 40.1  PLT 228 176   BMET Recent Labs    02/23/18 1233 02/24/18 0357  NA 138 140  K 4.1 3.6  CL 103 104  CO2 25 22  GLUCOSE 88 65*  BUN 8 7  CREATININE 0.69 0.71  CALCIUM 9.0 8.8*   PT/INR No results for input(s): LABPROT, INR in the last 72 hours. CMP     Component Value Date/Time   NA 140 02/24/2018 0357   K 3.6 02/24/2018 0357   CL 104 02/24/2018 0357   CO2 22 02/24/2018 0357   GLUCOSE 65 (L) 02/24/2018 0357   BUN 7 02/24/2018 0357    CREATININE 0.71 02/24/2018 0357   CALCIUM 8.8 (L) 02/24/2018 0357   PROT 7.1 02/23/2018 1233   ALBUMIN 3.7 02/23/2018 1233   AST 16 02/23/2018 1233   ALT 14 02/23/2018 1233   ALKPHOS 112 02/23/2018 1233   BILITOT 1.5 (H) 02/23/2018 1233   GFRNONAA >60 02/24/2018 0357   GFRAA >60 02/24/2018 0357   Lipase     Component Value Date/Time   LIPASE 23 02/23/2018 1233       Studies/Results: Ct Abdomen Pelvis W Contrast  Result Date: 02/23/2018 CLINICAL DATA:  Lower abdominal pain with onset of rectal bleeding last night. EXAM: CT ABDOMEN AND PELVIS WITH CONTRAST TECHNIQUE: Multidetector CT imaging of the abdomen and pelvis was performed using the standard protocol following bolus administration of intravenous contrast. CONTRAST:  100 ml OMNIPAQUE IOHEXOL 300 MG/ML  SOLN COMPARISON:  None. FINDINGS: Lower chest: There is cardiomegaly. No pericardial effusion. No pleural effusion. Dependent atelectasis is noted. Hepatobiliary: No focal liver abnormality is seen. Status post cholecystectomy. No biliary dilatation. Pancreas: Unremarkable. No pancreatic ductal dilatation or surrounding inflammatory changes. Spleen: Normal in size without focal abnormality. Adrenals/Urinary Tract: Adrenal glands are unremarkable. Kidneys are normal, without renal calculi, focal lesion, or hydronephrosis. Bladder is unremarkable. Stomach/Bowel: The patient has sigmoid diverticulosis with extensive stranding about the mid sigmoid colon consistent with acute diverticulitis.  A small fluid collection measuring 1.8 cm transverse by 1.7 cm AP by 2.7 cm craniocaudal in the inferior aspect of the right side of the pelvis appears may be a small amount of sympathetic fluid or could be phlegmon. No free intraperitoneal air is identified. The appendix is not identified and may have been removed. The stomach and small bowel are unremarkable. Vascular/Lymphatic: Aortic atherosclerosis. No enlarged abdominal or pelvic lymph nodes.  Reproductive: Status post hysterectomy. No adnexal masses. Other: None. Musculoskeletal: No acute or focal abnormality. Degenerative disease about the hips is worse on the left. IMPRESSION: Diverticulosis with superimposed acute sigmoid diverticulitis. Small volume of fluid in the inferior aspect of the right pelvis appears simple but could represent phlegmon. Negative for drainable fluid collection. Cardiomegaly. Atherosclerosis. Electronically Signed   By: Inge Rise M.D.   On: 02/23/2018 15:11    Anti-infectives: Anti-infectives (From admission, onward)   Start     Dose/Rate Route Frequency Ordered Stop   02/23/18 2300  piperacillin-tazobactam (ZOSYN) IVPB 3.375 g     3.375 g 12.5 mL/hr over 240 Minutes Intravenous Every 8 hours 02/23/18 1616     02/23/18 1545  piperacillin-tazobactam (ZOSYN) IVPB 3.375 g     3.375 g 100 mL/hr over 30 Minutes Intravenous  Once 02/23/18 1530 02/23/18 1848     Assessment/Plan Acute diverticulitis - afebrile, WBC normal - pain improved compared to yesterday but still tender on exam - clear liquids - continue IV Zosyn  - Will need GI follow up, planning to switch from [facility] to a GI physician in [place].  FEN: clears ID: Zosyn  VTE: SCD's, Lovenox; if clinically remains improved tomorrow can probably resume xarelto    LOS: 1 day    Jill Alexanders , [facility] 02/24/2018, 10:12 AM Pager: [phone] Consults: [phone] Mon-Fri 7:00 am-4:30 pm Sat-Sun 7:00 am-11:30 am

## 2018-02-25 ENCOUNTER — Other Ambulatory Visit: Payer: Self-pay

## 2018-02-25 DIAGNOSIS — K5792 Diverticulitis of intestine, part unspecified, without perforation or abscess without bleeding: Secondary | ICD-10-CM

## 2018-02-25 LAB — CBC
HEMATOCRIT: 39 % (ref 36.0–46.0)
HEMOGLOBIN: 12.9 g/dL (ref 12.0–15.0)
MCH: 31.1 pg (ref 26.0–34.0)
MCHC: 33.1 g/dL (ref 30.0–36.0)
MCV: 94 fL (ref 78.0–100.0)
Platelets: 189 10*3/uL (ref 150–400)
RBC: 4.15 MIL/uL (ref 3.87–5.11)
RDW: 12.4 % (ref 11.5–15.5)
WBC: 5.8 10*3/uL (ref 4.0–10.5)

## 2018-02-25 LAB — BASIC METABOLIC PANEL
Anion gap: 7 (ref 5–15)
CHLORIDE: 107 mmol/L (ref 98–111)
CO2: 25 mmol/L (ref 22–32)
CREATININE: 0.67 mg/dL (ref 0.44–1.00)
Calcium: 8.3 mg/dL — ABNORMAL LOW (ref 8.9–10.3)
GFR calc Af Amer: >60 mL/min (ref >60)
GFR calc non Af Amer: >60 mL/min (ref >60)
GLUCOSE: 83 mg/dL (ref 70–99)
POTASSIUM: 3.6 mmol/L (ref 3.5–5.1)
Sodium: 139 mmol/L (ref 135–145)

## 2018-02-25 MED ORDER — SODIUM CHLORIDE 0.9 % IV BOLUS
500.0000 mL | Freq: Once | INTRAVENOUS | Status: AC
  Administered 2018-02-25: 500 mL via INTRAVENOUS

## 2018-02-25 MED ORDER — SODIUM CHLORIDE 0.9 % IV SOLN
INTRAVENOUS | Status: DC
  Administered 2018-02-25 – 2018-02-26 (×2): 500 mL via INTRAVENOUS

## 2018-02-25 MED ORDER — POLYETHYLENE GLYCOL 3350 17 G PO PACK
17.0000 g | PACK | Freq: Every day | ORAL | Status: DC
  Administered 2018-02-25: 17 g via ORAL
  Filled 2018-02-25: qty 1

## 2018-02-25 NOTE — Progress Notes (Signed)
Pt BP low to the 77/46 and 80/41, notified on call.

## 2018-02-25 NOTE — Progress Notes (Addendum)
Subjective/Chief Complaint: Feels better, still some irritation when voiding, a little flatus   Objective: Vital signs in last 24 hours: Temp:  [98 F (36.7 C)-98.6 F (37 C)] 98.6 F (37 C) (07/02 2011) Pulse Rate:  [56-85] 56 (07/03 0524) Resp:  [16-18] 18 (07/03 0523) BP: (77-112)/(41-68) 80/41 (07/03 0524) SpO2:  [95 %-97 %] 97 % (07/03 0523) Last BM Date: 02/22/18  Intake/Output from previous day: 07/02 0701 - 07/03 0700 In: 387.6 [P.O.:300; IV Piggyback:87.6] Out: -  Intake/Output this shift: No intake/output data recorded.  abd mildly tender llq much better, otherwise soft bs present  Lab Results:  Recent Labs    02/24/18 0357 02/25/18 0417  WBC 8.6 5.8  HGB 13.2 12.9  HCT 40.1 39.0  PLT 176 189   BMET Recent Labs    02/24/18 0357 02/25/18 0417  NA 140 139  K 3.6 3.6  CL 104 107  CO2 22 25  GLUCOSE 65* 83  BUN 7 <5*  CREATININE 0.71 0.67  CALCIUM 8.8* 8.3*   PT/INR No results for input(s): LABPROT, INR in the last 72 hours. ABG No results for input(s): PHART, HCO3 in the last 72 hours.  Invalid input(s): PCO2, PO2  Studies/Results: Ct Abdomen Pelvis W Contrast  Result Date: 02/23/2018 CLINICAL DATA:  Lower abdominal pain with onset of rectal bleeding last night. EXAM: CT ABDOMEN AND PELVIS WITH CONTRAST TECHNIQUE: Multidetector CT imaging of the abdomen and pelvis was performed using the standard protocol following bolus administration of intravenous contrast. CONTRAST:  100 ml OMNIPAQUE IOHEXOL 300 MG/ML  SOLN COMPARISON:  None. FINDINGS: Lower chest: There is cardiomegaly. No pericardial effusion. No pleural effusion. Dependent atelectasis is noted. Hepatobiliary: No focal liver abnormality is seen. Status post cholecystectomy. No biliary dilatation. Pancreas: Unremarkable. No pancreatic ductal dilatation or surrounding inflammatory changes. Spleen: Normal in size without focal abnormality. Adrenals/Urinary Tract: Adrenal glands are  unremarkable. Kidneys are normal, without renal calculi, focal lesion, or hydronephrosis. Bladder is unremarkable. Stomach/Bowel: The patient has sigmoid diverticulosis with extensive stranding about the mid sigmoid colon consistent with acute diverticulitis. A small fluid collection measuring 1.8 cm transverse by 1.7 cm AP by 2.7 cm craniocaudal in the inferior aspect of the right side of the pelvis appears may be a small amount of sympathetic fluid or could be phlegmon. No free intraperitoneal air is identified. The appendix is not identified and may have been removed. The stomach and small bowel are unremarkable. Vascular/Lymphatic: Aortic atherosclerosis. No enlarged abdominal or pelvic lymph nodes. Reproductive: Status post hysterectomy. No adnexal masses. Other: None. Musculoskeletal: No acute or focal abnormality. Degenerative disease about the hips is worse on the left. IMPRESSION: Diverticulosis with superimposed acute sigmoid diverticulitis. Small volume of fluid in the inferior aspect of the right pelvis appears simple but could represent phlegmon. Negative for drainable fluid collection. Cardiomegaly. Atherosclerosis. Electronically Signed   By: Inge Rise M.D.   On: 02/23/2018 15:11    Anti-infectives: Anti-infectives (From admission, onward)   Start     Dose/Rate Route Frequency Ordered Stop   02/23/18 2300  piperacillin-tazobactam (ZOSYN) IVPB 3.375 g     3.375 g 12.5 mL/hr over 240 Minutes Intravenous Every 8 hours 02/23/18 1616     02/23/18 1545  piperacillin-tazobactam (ZOSYN) IVPB 3.375 g     3.375 g 100 mL/hr over 30 Minutes Intravenous  Once 02/23/18 1530 02/23/18 1848      Assessment/Plan: Acute diverticulitis - afebrile, WBC normal - exam much better today - fulls, advance to soft  as tolerated - continue IV Zosyn  - Will need GI follow up, planning to switch from [place] system to a GI physician in [place]. ID: Zosyn  Will check ua today although normal  before given persistent symptoms VTE: SCD's, Lovenox.  Can restart xarelto tomorrow if does well today    Rolm Bookbinder 02/25/2018

## 2018-02-25 NOTE — Progress Notes (Signed)
Patient Demographics:    [name], is a 57 y.o. female, DOB - 1960-11-01, OEU:[id number]  Admit date - 02/23/2018   Admitting Physician Karmen Bongo, MD  Outpatient Primary MD for the patient is Velna Hatchet, MD  LOS - 2   Chief Complaint  Patient presents with  . Abdominal Pain  . Rectal Bleeding        Subjective:    [name] today has no fevers, no emesis,  No chest pain, significant other at bedside, questions answered, tolerated clear liquid diet well requesting diet be advanced  Assessment  & Plan :    Principal Problem:   Acute diverticulitis Active Problems:   A-fib (HCC)   Anxiety   Heme positive stool   Urinary incontinence  Brief Narrative:  [name] a 57 y.o.femalewith medical history significant forAFon Tambocor, metoprolol, and Xarelto, anxiety disorder, history of inability to complete colonoscopy secondary to redundant colon. Patient reports that yesterday she developed left lower quadrant pain, sensation of needing to pass bowel movement but unable to pass at times. Also stools with clotted blood. She is also had issues with urinary incontinence. She has had fever up to 101.3 F. Because of severity of symptoms she presented to the ER. Patient did have leukocytosis 13,600. CT the abdomen and pelvis revealed acute diverticulitis involving phlegmon in the right pelvis but no drainable fluid collection. Patient has been given a dose of Zosyn in the ER and blood cultures been obtained. She is been evaluated by surgery and currently there are no acute surgical issues. Start of medical therapy to include bowel rest recommended.  Plan:-  1)Acute  sigmoid diverticulitis --clinically improving, no further leukocytosis, no further fevers, CT scan had suggested possible right pelvic phlegmon, surgical input appreciated, continue IV Zosyn, may advance diet to  full liquid diet,   2)Paroxysmal atrial fibrillation--continue flecainide and metoprolol for rate control, Xarelto on hold due to GI/abdominal concerns, patient did have Hemoccult positive stool this admission, blood pressure is soft most likely due to decreased oral intake and dehydration, consider holding metoprolol... Patient and her significant other request that telemetry be discontinued  3)FEN--continue IV fluids until oral intake improves especially given patient's soft BP  Code Status : Full code  Disposition Plan  : home once tolerating oral intake well and abdominal pain  Consults  :  Gen surg   DVT Prophylaxis  :  Xarelto on hold, use SCD  Lab Results  Component Value Date   PLT 189 02/25/2018    Inpatient Medications  Scheduled Meds: . flecainide  50 mg Oral BID  . metoprolol tartrate  25 mg Oral BID  . polyethylene glycol  17 g Oral Daily  . sodium chloride flush  3 mL Intravenous Q12H   Continuous Infusions: . sodium chloride 100 mL/hr at 02/25/18 0757  . piperacillin-tazobactam (ZOSYN)  IV 3.375 g (02/25/18 1540)   PRN Meds:.acetaminophen **OR** acetaminophen, ALPRAZolam, Melatonin, morphine injection, ondansetron (ZOFRAN) IV **OR** promethazine, traMADol    Anti-infectives (From admission, onward)   Start     Dose/Rate Route Frequency Ordered Stop   02/23/18 2300  piperacillin-tazobactam (ZOSYN) IVPB 3.375 g     3.375 g 12.5 mL/hr over 240 Minutes Intravenous Every 8 hours 02/23/18 1616  02/23/18 1545  piperacillin-tazobactam (ZOSYN) IVPB 3.375 g     3.375 g 100 mL/hr over 30 Minutes Intravenous  Once 02/23/18 1530 02/23/18 1848        Objective:   Vitals:   02/24/18 1408 02/24/18 2011 02/25/18 0523 02/25/18 0524  BP: 93/62 (!) 93/57 (!) 77/46 (!) 80/41  Pulse: 67 60 (!) 59 (!) 56  Resp: 18 16 18    Temp: 98 F (36.7 C) 98.6 F (37 C)    TempSrc:  Oral    SpO2: 95% 96% 97%   Weight:      Height:        Wt Readings from Last 3  Encounters:  02/23/18 97.9 kg (215 lb 13.3 oz)  07/12/17 104.3 kg (230 lb)  07/01/17 104.3 kg (229 lb 14.4 oz)     Intake/Output Summary (Last 24 hours) at 02/25/2018 1303 Last data filed at 02/24/2018 1811 Gross per 24 hour  Intake 37.55 ml  Output -  Net 37.55 ml     Physical Exam  Gen:- Awake Alert,  In no apparent distress  HEENT:- [place].AT, No sclera icterus Neck-Supple Neck,No JVD,.  Lungs-  CTAB , good air movement CV- S1, S2 normal Abd-  +ve B.Sounds, Abd Soft, improved LLQ abdominal tenderness, no rebound or guarding no CVA area tenderness Extremity/Skin:- No  edema,   Good pulses Psych-affect is appropriate, oriented x3 Neuro-no new focal deficits, no tremors   Data Review:   Micro Results Recent Results (from the past 240 hour(s))  Urine culture     Status: None   Collection Time: 02/23/18  2:20 PM  Result Value Ref Range Status   Specimen Description URINE, CLEAN CATCH  Final   Special Requests NONE  Final   Culture   Final    NO GROWTH Performed at [facility], [place] [address], [place], [place] [address]    Report Status 02/24/2018 FINAL  Final  Blood culture (routine x 2)     Status: None (Preliminary result)   Collection Time: 02/23/18  4:14 PM  Result Value Ref Range Status   Specimen Description BLOOD LEFT ANTECUBITAL  Final   Special Requests   Final    BOTTLES DRAWN AEROBIC AND ANAEROBIC Blood Culture adequate volume   Culture   Final    NO GROWTH 2 DAYS Performed at [facility], [place] [address] [place], [place] [address]    Report Status PENDING  Incomplete  Blood culture (routine x 2)     Status: None (Preliminary result)   Collection Time: 02/23/18  4:27 PM  Result Value Ref Range Status   Specimen Description BLOOD RIGHT HAND  Final   Special Requests   Final    BOTTLES DRAWN AEROBIC AND ANAEROBIC Blood Culture adequate volume   Culture   Final    NO GROWTH 2 DAYS Performed at [facility], [place] [address]  [place], [place] [address]    Report Status PENDING  Incomplete    Radiology Reports Ct Abdomen Pelvis W Contrast  Result Date: 02/23/2018 CLINICAL DATA:  Lower abdominal pain with onset of rectal bleeding last night. EXAM: CT ABDOMEN AND PELVIS WITH CONTRAST TECHNIQUE: Multidetector CT imaging of the abdomen and pelvis was performed using the standard protocol following bolus administration of intravenous contrast. CONTRAST:  100 ml OMNIPAQUE IOHEXOL 300 MG/ML  SOLN COMPARISON:  None. FINDINGS: Lower chest: There is cardiomegaly. No pericardial effusion. No pleural effusion. Dependent atelectasis is noted. Hepatobiliary: No focal liver  abnormality is seen. Status post cholecystectomy. No biliary dilatation. Pancreas: Unremarkable. No pancreatic ductal dilatation or surrounding inflammatory changes. Spleen: Normal in size without focal abnormality. Adrenals/Urinary Tract: Adrenal glands are unremarkable. Kidneys are normal, without renal calculi, focal lesion, or hydronephrosis. Bladder is unremarkable. Stomach/Bowel: The patient has sigmoid diverticulosis with extensive stranding about the mid sigmoid colon consistent with acute diverticulitis. A small fluid collection measuring 1.8 cm transverse by 1.7 cm AP by 2.7 cm craniocaudal in the inferior aspect of the right side of the pelvis appears may be a small amount of sympathetic fluid or could be phlegmon. No free intraperitoneal air is identified. The appendix is not identified and may have been removed. The stomach and small bowel are unremarkable. Vascular/Lymphatic: Aortic atherosclerosis. No enlarged abdominal or pelvic lymph nodes. Reproductive: Status post hysterectomy. No adnexal masses. Other: None. Musculoskeletal: No acute or focal abnormality. Degenerative disease about the hips is worse on the left. IMPRESSION: Diverticulosis with superimposed acute sigmoid diverticulitis. Small volume of fluid in the inferior aspect of the right pelvis appears  simple but could represent phlegmon. Negative for drainable fluid collection. Cardiomegaly. Atherosclerosis. Electronically Signed   By: Inge Rise M.D.   On: 02/23/2018 15:11   Xr Knee 1-2 Views Left  Result Date: 01/27/2018 Stable appearance of left total knee replacement without any evidence of loosening or complications.    CBC Recent Labs  Lab 02/23/18 1233 02/24/18 0357 02/25/18 0417  WBC 13.6* 8.6 5.8  HGB 14.6 13.2 12.9  HCT 44.9 40.1 39.0  PLT 228 176 189  MCV 96.1 94.4 94.0  MCH 31.3 31.1 31.1  MCHC 32.5 32.9 33.1  RDW 12.7 12.6 12.4    Chemistries  Recent Labs  Lab 02/23/18 1233 02/24/18 0357 02/25/18 0417  NA 138 140 139  K 4.1 3.6 3.6  CL 103 104 107  CO2 25 22 25   GLUCOSE 88 65* 83  BUN 8 7 <5*  CREATININE 0.69 0.71 0.67  CALCIUM 9.0 8.8* 8.3*  AST 16  --   --   ALT 14  --   --   ALKPHOS 112  --   --   BILITOT 1.5*  --   --    ------------------------------------------------------------------------------------------------------------------ No results for input(s): CHOL, HDL, LDLCALC, TRIG, CHOLHDL, LDLDIRECT in the last 72 hours.  No results found for: HGBA1C ------------------------------------------------------------------------------------------------------------------ No results for input(s): TSH, T4TOTAL, T3FREE, THYROIDAB in the last 72 hours.  Invalid input(s): FREET3 ------------------------------------------------------------------------------------------------------------------ No results for input(s): VITAMINB12, FOLATE, FERRITIN, TIBC, IRON, RETICCTPCT in the last 72 hours.  Coagulation profile No results for input(s): INR, PROTIME in the last 168 hours.  No results for input(s): DDIMER in the last 72 hours.  Cardiac Enzymes No results for input(s): CKMB, TROPONINI, MYOGLOBIN in the last 168 hours.  Invalid input(s):  CK ------------------------------------------------------------------------------------------------------------------ No results found for: BNP   Roxan Hockey M.D on 02/25/2018 at 1:03 PM  Between 7am to 7pm - Pager - [phone]  After 7pm go to [email or web address] - password [email or web address]  [facility] -  Office  [phone]   Voice Recognition Viviann Spare dictation system was used to create this note, attempts have been made to correct errors. Please contact the author with questions and/or clarifications.

## 2018-02-26 LAB — URINALYSIS, ROUTINE W REFLEX MICROSCOPIC
Bilirubin Urine: NEGATIVE
GLUCOSE, UA: NEGATIVE mg/dL
HGB URINE DIPSTICK: NEGATIVE
Ketones, ur: NEGATIVE mg/dL
Leukocytes, UA: NEGATIVE
Nitrite: NEGATIVE
PROTEIN: NEGATIVE mg/dL
Specific Gravity, Urine: 1.005 (ref 1.005–1.030)
pH: 7 (ref 5.0–8.0)

## 2018-02-26 LAB — C DIFFICILE QUICK SCREEN W PCR REFLEX
C DIFFICLE (CDIFF) ANTIGEN: NEGATIVE
C Diff interpretation: NOT DETECTED
C Diff toxin: NEGATIVE

## 2018-02-26 MED ORDER — METOPROLOL TARTRATE 12.5 MG HALF TABLET
12.5000 mg | ORAL_TABLET | Freq: Two times a day (BID) | ORAL | Status: DC
  Administered 2018-02-26 – 2018-02-27 (×2): 12.5 mg via ORAL
  Filled 2018-02-26 (×2): qty 1

## 2018-02-26 MED ORDER — BACID PO TABS
2.0000 | ORAL_TABLET | Freq: Three times a day (TID) | ORAL | Status: DC
  Administered 2018-02-26 – 2018-02-27 (×3): 2 via ORAL
  Filled 2018-02-26 (×3): qty 2

## 2018-02-26 MED ORDER — LOPERAMIDE HCL 2 MG PO CAPS: 2.0000 mg | ORAL_CAPSULE | Freq: Four times a day (QID) | ORAL | Status: DC | PRN

## 2018-02-26 MED ORDER — POLYETHYLENE GLYCOL 3350 17 G PO PACK: 17.0000 g | PACK | Freq: Every day | ORAL | Status: DC | PRN

## 2018-02-26 NOTE — Progress Notes (Signed)
Pt reports that she has had multiple loose stools with abdominal pain during bowel movements since her diet was advanced to soft.  Paged [facility] provider Tylene Fantasia to inquire about a possible C. Diff test.  No additional orders were received.  Will continue to monitor.

## 2018-02-26 NOTE — Progress Notes (Signed)
Patient Demographics:    [name], is a 57 y.o. female, DOB - 1961-07-20, ZOX:[id number]  Admit date - 02/23/2018   Admitting Physician Karmen Bongo, MD  Outpatient Primary MD for the patient is Velna Hatchet, MD  LOS - 3   Chief Complaint  Patient presents with  . Abdominal Pain  . Rectal Bleeding        Subjective:    [name] today has no fevers, no emesis,  Had > 13 BM in last 24 hrs, ??? Got worse with meals , stools are watery, non-bloody, abd pain is worse   Assessment  & Plan :    Principal Problem:   Acute diverticulitis Active Problems:   A-fib (HCC)   Anxiety   Heme positive stool   Urinary incontinence  Brief Narrative:  [name] a 57 y.o.femalewith medical history significant forAFon Tambocor, metoprolol, and Xarelto, anxiety disorder, history of inability to complete colonoscopy secondary to redundant colon. Patient reports that yesterday she developed left lower quadrant pain, sensation of needing to pass bowel movement but unable to pass at times. Also stools with clotted blood. She is also had issues with urinary incontinence. She has had fever up to 101.3 F. Because of severity of symptoms she presented to the ER. Patient did have leukocytosis 13,600. CT the abdomen and pelvis revealed acute diverticulitis involving phlegmon in the right pelvis but no drainable fluid collection. Patient has been given a dose of Zosyn in the ER and blood cultures been obtained. She is been evaluated by surgery and currently there are no acute surgical issues.      Plan:-  1)Acute  Sigmoid Diverticulitis --clinically worse, abdominal pain is worse, patient with frequent loose stools, CT scan had suggested possible right pelvic phlegmon, surgical input appreciated, continue IV Zosyn, surgical consult appreciated, surgery has ordered stool studies (c  diff)  2)Paroxysmal atrial fibrillation--stable, continue flecainide and metoprolol for rate control, continue to hold Xarelto due to GI/abdominal concerns, patient did have Hemoccult positive stool this admission, blood pressure is soft most likely due to decreased oral intake and dehydration, consider holding metoprolol... Patient and her significant other request that telemetry be discontinued  3)FEN--continue IV fluids until oral intake improves especially given patient's soft BP  Code Status : Full code  Disposition Plan  : home once tolerating oral intake well and abdominal pain  Consults  :  Gen surg  DVT Prophylaxis  :  Xarelto on hold, use SCD  Lab Results  Component Value Date   PLT 189 02/25/2018    Inpatient Medications  Scheduled Meds: . flecainide  50 mg Oral BID  . metoprolol tartrate  25 mg Oral BID  . sodium chloride flush  3 mL Intravenous Q12H   Continuous Infusions: . sodium chloride 100 mL/hr at 02/26/18 1009  . sodium chloride Stopped (02/26/18 8119)  . piperacillin-tazobactam (ZOSYN)  IV 12.5 mL/hr at 02/26/18 1009   PRN Meds:.acetaminophen **OR** acetaminophen, ALPRAZolam, Melatonin, morphine injection, ondansetron (ZOFRAN) IV **OR** promethazine, polyethylene glycol, traMADol    Anti-infectives (From admission, onward)   Start     Dose/Rate Route Frequency Ordered Stop   02/23/18 2300  piperacillin-tazobactam (ZOSYN) IVPB 3.375 g     3.375 g 12.5 mL/hr over 240 Minutes  Intravenous Every 8 hours 02/23/18 1616     02/23/18 1545  piperacillin-tazobactam (ZOSYN) IVPB 3.375 g     3.375 g 100 mL/hr over 30 Minutes Intravenous  Once 02/23/18 1530 02/23/18 1848        Objective:   Vitals:   02/25/18 1325 02/25/18 2103 02/26/18 0613 02/26/18 1003  BP: 97/67 107/72 115/70   Pulse: (!) 54 (!) 57 (!) 57 66  Resp: 16 17 17    Temp: 98.5 F (36.9 C) 99.3 F (37.4 C) 98.4 F (36.9 C)   TempSrc: Oral Oral Oral   SpO2: 95% 100% 95%   Weight:       Height:        Wt Readings from Last 3 Encounters:  02/23/18 97.9 kg (215 lb 13.3 oz)  07/12/17 104.3 kg (230 lb)  07/01/17 104.3 kg (229 lb 14.4 oz)     Intake/Output Summary (Last 24 hours) at 02/26/2018 1234 Last data filed at 02/26/2018 0310 Gross per 24 hour  Intake 2814.98 ml  Output -  Net 2814.98 ml     Physical Exam  Gen:- Awake Alert,  In no apparent distress  HEENT:- [place].AT, No sclera icterus Neck-Supple Neck,No JVD,.  Lungs-  CTAB , good air movement CV- S1, S2 normal Abd-  +ve B.Sounds, Abd Soft,  LLQ abdominal tenderness, no rebound or guarding, no CVA area tenderness Extremity/Skin:- No  edema,   Good pulses Psych-affect is appropriate, oriented x3 Neuro-no new focal deficits, no tremors   Data Review:   Micro Results Recent Results (from the past 240 hour(s))  Urine culture     Status: None   Collection Time: 02/23/18  2:20 PM  Result Value Ref Range Status   Specimen Description URINE, CLEAN CATCH  Final   Special Requests NONE  Final   Culture   Final    NO GROWTH Performed at [facility], [place] [address] [place], [place] [address]    Report Status 02/24/2018 FINAL  Final  Blood culture (routine x 2)     Status: None (Preliminary result)   Collection Time: 02/23/18  4:14 PM  Result Value Ref Range Status   Specimen Description BLOOD LEFT ANTECUBITAL  Final   Special Requests   Final    BOTTLES DRAWN AEROBIC AND ANAEROBIC Blood Culture adequate volume   Culture   Final    NO GROWTH 2 DAYS Performed at [facility], [place] [address], [place], [place] [address]    Report Status PENDING  Incomplete  Blood culture (routine x 2)     Status: None (Preliminary result)   Collection Time: 02/23/18  4:27 PM  Result Value Ref Range Status   Specimen Description BLOOD RIGHT HAND  Final   Special Requests   Final    BOTTLES DRAWN AEROBIC AND ANAEROBIC Blood Culture adequate volume   Culture   Final    NO GROWTH 2 DAYS Performed at [facility], [place] [address] [place], [place] [address]    Report Status PENDING  Incomplete  C difficile quick scan w PCR reflex     Status: None   Collection Time: 02/26/18  8:21 AM  Result Value Ref Range Status   C Diff antigen NEGATIVE NEGATIVE Final   C Diff toxin NEGATIVE NEGATIVE Final   C Diff interpretation No C. difficile detected.  Final    Comment: Performed at [facility], [place] [address] [place], [place] [address]    Radiology Reports  Ct Abdomen Pelvis W Contrast  Result Date: 02/23/2018 CLINICAL DATA:  Lower abdominal pain with onset of rectal bleeding last night. EXAM: CT ABDOMEN AND PELVIS WITH CONTRAST TECHNIQUE: Multidetector CT imaging of the abdomen and pelvis was performed using the standard protocol following bolus administration of intravenous contrast. CONTRAST:  100 ml OMNIPAQUE IOHEXOL 300 MG/ML  SOLN COMPARISON:  None. FINDINGS: Lower chest: There is cardiomegaly. No pericardial effusion. No pleural effusion. Dependent atelectasis is noted. Hepatobiliary: No focal liver abnormality is seen. Status post cholecystectomy. No biliary dilatation. Pancreas: Unremarkable. No pancreatic ductal dilatation or surrounding inflammatory changes. Spleen: Normal in size without focal abnormality. Adrenals/Urinary Tract: Adrenal glands are unremarkable. Kidneys are normal, without renal calculi, focal lesion, or hydronephrosis. Bladder is unremarkable. Stomach/Bowel: The patient has sigmoid diverticulosis with extensive stranding about the mid sigmoid colon consistent with acute diverticulitis. A small fluid collection measuring 1.8 cm transverse by 1.7 cm AP by 2.7 cm craniocaudal in the inferior aspect of the right side of the pelvis appears may be a small amount of sympathetic fluid or could be phlegmon. No free intraperitoneal air is identified. The appendix is not identified and may have been removed. The stomach and small bowel are unremarkable. Vascular/Lymphatic: Aortic  atherosclerosis. No enlarged abdominal or pelvic lymph nodes. Reproductive: Status post hysterectomy. No adnexal masses. Other: None. Musculoskeletal: No acute or focal abnormality. Degenerative disease about the hips is worse on the left. IMPRESSION: Diverticulosis with superimposed acute sigmoid diverticulitis. Small volume of fluid in the inferior aspect of the right pelvis appears simple but could represent phlegmon. Negative for drainable fluid collection. Cardiomegaly. Atherosclerosis. Electronically Signed   By: Inge Rise M.D.   On: 02/23/2018 15:11     CBC Recent Labs  Lab 02/23/18 1233 02/24/18 0357 02/25/18 0417  WBC 13.6* 8.6 5.8  HGB 14.6 13.2 12.9  HCT 44.9 40.1 39.0  PLT 228 176 189  MCV 96.1 94.4 94.0  MCH 31.3 31.1 31.1  MCHC 32.5 32.9 33.1  RDW 12.7 12.6 12.4    Chemistries  Recent Labs  Lab 02/23/18 1233 02/24/18 0357 02/25/18 0417  NA 138 140 139  K 4.1 3.6 3.6  CL 103 104 107  CO2 25 22 25   GLUCOSE 88 65* 83  BUN 8 7 <5*  CREATININE 0.69 0.71 0.67  CALCIUM 9.0 8.8* 8.3*  AST 16  --   --   ALT 14  --   --   ALKPHOS 112  --   --   BILITOT 1.5*  --   --    ------------------------------------------------------------------------------------------------------------------ No results for input(s): CHOL, HDL, LDLCALC, TRIG, CHOLHDL, LDLDIRECT in the last 72 hours.  No results found for: HGBA1C ------------------------------------------------------------------------------------------------------------------ No results for input(s): TSH, T4TOTAL, T3FREE, THYROIDAB in the last 72 hours.  Invalid input(s): FREET3 ------------------------------------------------------------------------------------------------------------------ No results for input(s): VITAMINB12, FOLATE, FERRITIN, TIBC, IRON, RETICCTPCT in the last 72 hours.  Coagulation profile No results for input(s): INR, PROTIME in the last 168 hours.  No results for input(s): DDIMER in the last  72 hours.  Cardiac Enzymes No results for input(s): CKMB, TROPONINI, MYOGLOBIN in the last 168 hours.  Invalid input(s): CK ------------------------------------------------------------------------------------------------------------------ No results found for: BNP   Roxan Hockey M.D on 02/26/2018 at 12:34 PM  Between 7am to 7pm - Pager - [phone]  After 7pm go to [email or web address] - password [email or web address]  [facility] -  Office  [phone]   Voice Recognition Viviann Spare dictation system was used to create this note, attempts have been made to  correct errors. Please contact the author with questions and/or clarifications.

## 2018-02-26 NOTE — Progress Notes (Signed)
   Subjective/Chief Complaint:abdominal pain Had 15 BM yesterday after eating   More LLQ pain  and urinary frequency    Objective: Vital signs in last 24 hours: Temp:  [98.4 F (36.9 C)-99.3 F (37.4 C)] 98.4 F (36.9 C) (07/04 9407) Pulse Rate:  [54-57] 57 (07/04 0613) Resp:  [16-17] 17 (07/04 0613) BP: (97-115)/(67-72) 115/70 (07/04 0613) SpO2:  [95 %-100 %] 95 % (07/04 0613) Last BM Date: 02/25/18  Intake/Output from previous day: 07/03 0701 - 07/04 0700 In: 2815 [I.V.:2549.2; IV Piggyback:265.8] Out: -  Intake/Output this shift: No intake/output data recorded.  General appearance: alert and cooperative Cardio: regular rate and rhythm, S1, S2 normal, no murmur, click, rub or gallop GI: tender LLQ no rebound soft   Lab Results:  Recent Labs    02/24/18 0357 02/25/18 0417  WBC 8.6 5.8  HGB 13.2 12.9  HCT 40.1 39.0  PLT 176 189   BMET Recent Labs    02/24/18 0357 02/25/18 0417  NA 140 139  K 3.6 3.6  CL 104 107  CO2 22 25  GLUCOSE 65* 83  BUN 7 <5*  CREATININE 0.71 0.67  CALCIUM 8.8* 8.3*   PT/INR No results for input(s): LABPROT, INR in the last 72 hours. ABG No results for input(s): PHART, HCO3 in the last 72 hours.  Invalid input(s): PCO2, PO2  Studies/Results: No results found.  Anti-infectives: Anti-infectives (From admission, onward)   Start     Dose/Rate Route Frequency Ordered Stop   02/23/18 2300  piperacillin-tazobactam (ZOSYN) IVPB 3.375 g     3.375 g 12.5 mL/hr over 240 Minutes Intravenous Every 8 hours 02/23/18 1616     02/23/18 1545  piperacillin-tazobactam (ZOSYN) IVPB 3.375 g     3.375 g 100 mL/hr over 30 Minutes Intravenous  Once 02/23/18 1530 02/23/18 1848      Assessment/Plan: Acute diverticulitis - afebrile, WBC normal - exam worse today and copious diarrhea- check C diff  - fulls,  - continue IV Zosyn  - Will need GI follow up, planning to switch from [place] system to a GI physician in [place]. ID: [email or web address]   Will check ua today although normal before given persistent symptoms VTE: SCD's, Lovenox.  hold  xaralto for now given change in status      LOS: 3 days    Joyice Faster Aliyyah Riese 02/26/2018

## 2018-02-27 ENCOUNTER — Encounter: Payer: Self-pay | Admitting: Internal Medicine

## 2018-02-27 LAB — COMPREHENSIVE METABOLIC PANEL
ALBUMIN: 2.7 g/dL — AB (ref 3.5–5.0)
ALT: 22 U/L (ref 0–44)
AST: 21 U/L (ref 15–41)
Alkaline Phosphatase: 123 U/L (ref 38–126)
Anion gap: 5 (ref 5–15)
BUN: <5 mg/dL — ABNORMAL LOW (ref 6–20)
CALCIUM: 8.2 mg/dL — AB (ref 8.9–10.3)
CHLORIDE: 112 mmol/L — AB (ref 98–111)
CO2: 26 mmol/L (ref 22–32)
Creatinine, Ser: 0.67 mg/dL (ref 0.44–1.00)
GFR calc Af Amer: >60 mL/min (ref >60)
GFR calc non Af Amer: >60 mL/min (ref >60)
Glucose, Bld: 104 mg/dL — ABNORMAL HIGH (ref 70–99)
POTASSIUM: 3.6 mmol/L (ref 3.5–5.1)
Sodium: 143 mmol/L (ref 135–145)
TOTAL PROTEIN: 6 g/dL — AB (ref 6.5–8.1)
Total Bilirubin: 0.3 mg/dL (ref 0.3–1.2)

## 2018-02-27 LAB — CBC
HEMATOCRIT: 36.9 % (ref 36.0–46.0)
Hemoglobin: 12 g/dL (ref 12.0–15.0)
MCH: 30.9 pg (ref 26.0–34.0)
MCHC: 32.5 g/dL (ref 30.0–36.0)
MCV: 95.1 fL (ref 78.0–100.0)
PLATELETS: 199 10*3/uL (ref 150–400)
RBC: 3.88 MIL/uL (ref 3.87–5.11)
RDW: 12.4 % (ref 11.5–15.5)
WBC: 4.9 10*3/uL (ref 4.0–10.5)

## 2018-02-27 MED ORDER — LOPERAMIDE HCL 2 MG PO CAPS
2.0000 mg | ORAL_CAPSULE | Freq: Once | ORAL | Status: AC
  Administered 2018-02-27: 2 mg via ORAL
  Filled 2018-02-27: qty 1

## 2018-02-27 MED ORDER — BACID PO TABS: 2.0000 | ORAL_TABLET | Freq: Three times a day (TID) | ORAL | 1 refills | Status: DC

## 2018-02-27 MED ORDER — SACCHAROMYCES BOULARDII 250 MG PO CAPS
250.0000 mg | ORAL_CAPSULE | Freq: Two times a day (BID) | ORAL | Status: DC
  Administered 2018-02-27: 250 mg via ORAL
  Filled 2018-02-27 (×2): qty 1

## 2018-02-27 MED ORDER — HYDROCODONE-ACETAMINOPHEN 5-325 MG PO TABS: 1.0000 | ORAL_TABLET | Freq: Four times a day (QID) | ORAL | 0 refills | Status: DC | PRN

## 2018-02-27 MED ORDER — LOPERAMIDE HCL 2 MG PO TABS: 2.0000 mg | ORAL_TABLET | Freq: Three times a day (TID) | ORAL | 0 refills | Status: DC | PRN

## 2018-02-27 MED ORDER — AMOXICILLIN-POT CLAVULANATE 875-125 MG PO TABS: 1.0000 | ORAL_TABLET | Freq: Two times a day (BID) | ORAL | 0 refills | Status: AC

## 2018-02-27 MED ORDER — CIPROFLOXACIN HCL 500 MG PO TABS: 500.0000 mg | ORAL_TABLET | Freq: Two times a day (BID) | ORAL | 0 refills | Status: DC

## 2018-02-27 MED ORDER — METOPROLOL TARTRATE 25 MG PO TABS: 12.5000 mg | ORAL_TABLET | Freq: Two times a day (BID) | ORAL | 0 refills | Status: DC

## 2018-02-27 MED ORDER — METRONIDAZOLE 500 MG PO TABS: 500.0000 mg | ORAL_TABLET | Freq: Three times a day (TID) | ORAL | 0 refills | Status: DC

## 2018-02-27 NOTE — Discharge Summary (Addendum)
[name], is a 57 y.o. female  DOB 05-Mar-1961  MRN [id number].  Admission date:  02/23/2018  Admitting Physician  Karmen Bongo, MD  Discharge Date:  02/27/2018   Primary MD  Velna Hatchet, MD  Recommendations for primary care physician for things to follow:  1) take antibiotics as prescribed, absolutely no alcohol while taking metronidazole/Flagyl antibiotic 2) eat yogurt/probiotics--- may take over-the-counter probiotic tablets 3) use Imodium sparingly for diarrhea, if diarrhea persist please call 4) you need to follow-up with [facility] gastroenterology group (Dr Cristi Loron colonoscopy within the next 3 to 4 months 5)Avoid ibuprofen/Advil/Aleve/Motrin/Goody Powders/Naproxen/BC powders/Meloxicam/Diclofenac/Indomethacin and other Nonsteroidal anti-inflammatory medications as these will make you more likely to bleed and can cause stomach ulcers, can also cause Kidney problems.  6) soft diet advised 7)decrease metoprolol to 12.5 mg twice daily due to soft BP and relative bradycardia   Admission Diagnosis  Rectal bleeding [K62.5] Sigmoid diverticulitis [K57.32]   Discharge Diagnosis  Rectal bleeding [K62.5] Sigmoid diverticulitis [K57.32]    Principal Problem:   Acute diverticulitis Active Problems:   A-fib ([place])   Anxiety   Heme positive stool   Urinary incontinence      Past Medical History:  Diagnosis Date  . A-fib ([place])   . Anxiety   . Arthritis   . Dysrhythmia    hx of afib, afib ablation in 01/2014, no afib since then  . Pneumonia    as a child    Past Surgical History:  Procedure Laterality Date  . ABDOMINAL HYSTERECTOMY    . BLADDER SURGERY  2000  . CARDIAC ELECTROPHYSIOLOGY MAPPING AND ABLATION     for afib  . CESAREAN SECTION    . CHOLECYSTECTOMY    . fallopian tube removal    . loop recorder explant  2015  . LOOP RECORDER IMPLANT  2015  . TONSILLECTOMY AND  ADENOIDECTOMY    . TOTAL KNEE ARTHROPLASTY Left 07/10/2017   Procedure: LEFT TOTAL KNEE ARTHROPLASTY;  Surgeon: Leandrew Koyanagi, MD;  Location: [place];  Service: Orthopedics;  Laterality: Left;  Marland Kitchen VAGINAL HYSTERECTOMY         HPI  from the history and physical done on the day of admission:   Chief Complaint: Abdominal pain, blood in stool  HPI: [name] is a 57 y.o. female with medical history significant for AF on Tambocor and Xarelto, anxiety disorder, history of inability to complete colonoscopy secondary to redundant colon.  Patient reports that yesterday she developed left lower quadrant pain, sensation of needing to pass bowel movement but unable to pass at times.  Also stools with clotted blood.  She is also had issues with urinary incontinence.  She has had fever up to 101.3 F.  Because of severity of symptoms she presented to the ER.  Patient did have leukocytosis 13,600.  CT the abdomen and pelvis revealed acute diverticulitis involving phlegmon in the right pelvis but no drainable fluid collection.  Patient has been given a dose of Zosyn in the ER and blood cultures been obtained.  She is been evaluated by surgery and currently there are no acute surgical issues.  Start of medical therapy to include bowel rest recommended.  ED Course:  Vital Signs: BP 106/67   Pulse 76   Temp 99.1 F (37.3 C) (Oral)   Resp (!) 22   Ht 5\' 6"  (1.676 m)   Wt 93.4 kg (206 lb)   SpO2 99%   BMI 33.25 kg/m  CT abdomen and pelvis: As above Lab data: Sodium 138, potassium 4.1, chloride 103, CO2 25, glucose 88, BUN 8, creatinine 0.69, LFTs normal except for mildly elevated total bilirubin 1.5, lactic acid normal, white count 13,600 differential not obtained, hemoglobin 14.6, platelets 228,000 Medications and treatments: Zosyn 3.375 g x 1    Hospital Course:     Brief Narrative: [name] a 57 y.o.femalewith medical history significant forAFon Tambocor, metoprolol,and Xarelto,  anxiety disorder, history of inability to complete colonoscopy secondary to redundant colon. Patient reports that yesterday she developed left lower quadrant pain, sensation of needing to pass bowel movement but unable to pass at times. Also stools with clotted blood. She is also had issues with urinary incontinence. She has had fever up to 101.3 F. Because of severity of symptoms she presented to the ER. Patient did have leukocytosis 13,600. CT the abdomen and pelvis revealed acute diverticulitis involving phlegmon in the right pelvis but no drainable fluid collection. Patient has been given a dose of Zosyn in the ER and blood cultures been obtained. She is been evaluated by surgery and currently there are no acute surgical issues.     NB!!! Phone call from [facility] pharmacist Christine--- concerns about possible proarrhythmic interaction between Cipro and flecainide...  Flagyl and Cipro discontinued,  verbal order to give Augmentin 875 p.o. twice daily for 10 days for acute diverticulitis given to her pharmacist Altha Harm---- [phone]    Plan:-  1)Acute  Sigmoid Diverticulitis -- clinically  improving, only 2 loose BMs in the last 12 hours , abdominal pain is better, no fevers or chills, tolerating solid food well, CT scan had suggested possible right pelvic phlegmon, surgical input appreciated, treated with IV Zosyn, surgical consult appreciated, surgery has ordered stool studies (c diff came back negative).... Given significant profuse, frequent watery stools and Zosyn will discharge patient home on Cipro and Flagyl rather than Augmentin.  Take probiotics as discussed, Imodium sparingly.  Outpatient follow-up with gastroenterology for further evaluation and possible colonoscopy  2)Paroxysmal atrial fibrillation--stable, continue flecainide, decrease metoprolol to 12.5 mg twice daily due to soft BP and relative bradycardia ,  restart Xarelto    NB!!! Phone call from [facility] pharmacist  Christine--- concerns about possible proarrhythmic interaction between Cipro and flecainide...  Flagyl and Cipro discontinued,  verbal order to give Augmentin 875 p.o. twice daily for 10 days for acute diverticulitis given to her pharmacist Altha Harm---- [phone]  Code Status : Full code  Disposition Plan  : home  Consults  :  Gen surg  Discharge Condition: stable  Follow UP  [facility] Go on 03/13/2018.   Specialty:  General Surgery Why:  Your appointment is Friday July 19 at 8:30 am Please arrive 30 minutes prior to your appointment to check in and fill out paperwork. Bring photo ID and insurnace information. Contact information: [place] [phone]          Diet and Activity recommendation:  As advised  Discharge Instructions    Discharge  Instructions    Call MD for:  difficulty breathing, headache or visual disturbances   Complete by:  As directed    Call MD for:  persistant dizziness or light-headedness   Complete by:  As directed    Call MD for:  persistant nausea and vomiting   Complete by:  As directed    Call MD for:  severe uncontrolled pain   Complete by:  As directed    Call MD for:  temperature >100.4   Complete by:  As directed    Diet - low sodium heart healthy   Complete by:  As directed    Discharge instructions   Complete by:  As directed    1) take antibiotics as prescribed, absolutely no alcohol while taking metronidazole/Flagyl antibiotic 2) eat yogurt/probiotics--- may take over-the-counter probiotic tablets 3) use Imodium sparingly for diarrhea, if diarrhea persist please call 4) you need to follow-up with [facility] gastroenterology group (Dr Cristi Loron colonoscopy within the next 3 to 4 months 5)Avoid ibuprofen/Advil/Aleve/Motrin/Goody Powders/Naproxen/BC powders/Meloxicam/Diclofenac/Indomethacin and other Nonsteroidal  anti-inflammatory medications as these will make you more likely to bleed and can cause stomach ulcers, can also cause Kidney problems.  6) soft diet advised   NB!!! Phone call from [facility]ist Christine--- concerns about possible proarrhythmic interaction between Cipro and flecainide...  Flagyl and Cipro discontinued,  verbal order to give Augmentin 875 p.o. twice daily for 10 days for acute diverticulitis given to her pharmacist Altha Harm---- [phone]   Increase activity slowly   Complete by:  As directed         Discharge Medications     Allergies as of 02/27/2018   No Known Allergies     Medication List    STOP taking these medications   senna-docusate 8.6-50 MG tablet Commonly known as:  Senokot-S     TAKE these medications   acetaminophen 500 MG tablet Commonly known as:  TYLENOL Take 1,000 mg by mouth every 6 (six) hours as needed for mild pain or headache.   ALPRAZolam 0.5 MG tablet Commonly known as:  XANAX Take 0.25 mg by mouth daily as needed for anxiety.   amoxicillin-clavulanate 875-125 MG tablet Commonly known as:  AUGMENTIN Take 1 tablet by mouth every 12 (twelve) hours for 10 days.   flecainide 50 MG tablet Commonly known as:  TAMBOCOR Take 50 mg by mouth 2 (two) times daily.   HYDROcodone-acetaminophen 5-325 MG tablet Commonly known as:  NORCO/VICODIN Take 1 tablet by mouth every 6 (six) hours as needed for moderate pain.   lactobacillus acidophilus Tabs tablet Take 2 tablets by mouth 3 (three) times daily.   loperamide 2 MG tablet Commonly known as:  IMODIUM A-D Take 1 tablet (2 mg total) by mouth 3 (three) times daily as needed for diarrhea or loose stools.   Melatonin 10 MG Tabs Take 10 mg by mouth at bedtime as needed (for sleep).   metoprolol tartrate 25 MG tablet Commonly known as:  LOPRESSOR Take 0.5 tablets (12.5 mg total) by mouth 2 (two) times daily. What changed:  how much to take   XARELTO 20 MG Tabs tablet Generic  drug:  rivaroxaban Take 20 mg by mouth daily with supper.       Major procedures and Radiology Reports - PLEASE review detailed and final reports for all details, in brief -   Ct Abdomen Pelvis W Contrast  Result Date: 02/23/2018 CLINICAL DATA:  Lower abdominal pain with onset of rectal bleeding last night. EXAM: CT ABDOMEN AND PELVIS WITH  CONTRAST TECHNIQUE: Multidetector CT imaging of the abdomen and pelvis was performed using the standard protocol following bolus administration of intravenous contrast. CONTRAST:  100 ml OMNIPAQUE IOHEXOL 300 MG/ML  SOLN COMPARISON:  None. FINDINGS: Lower chest: There is cardiomegaly. No pericardial effusion. No pleural effusion. Dependent atelectasis is noted. Hepatobiliary: No focal liver abnormality is seen. Status post cholecystectomy. No biliary dilatation. Pancreas: Unremarkable. No pancreatic ductal dilatation or surrounding inflammatory changes. Spleen: Normal in size without focal abnormality. Adrenals/Urinary Tract: Adrenal glands are unremarkable. Kidneys are normal, without renal calculi, focal lesion, or hydronephrosis. Bladder is unremarkable. Stomach/Bowel: The patient has sigmoid diverticulosis with extensive stranding about the mid sigmoid colon consistent with acute diverticulitis. A small fluid collection measuring 1.8 cm transverse by 1.7 cm AP by 2.7 cm craniocaudal in the inferior aspect of the right side of the pelvis appears may be a small amount of sympathetic fluid or could be phlegmon. No free intraperitoneal air is identified. The appendix is not identified and may have been removed. The stomach and small bowel are unremarkable. Vascular/Lymphatic: Aortic atherosclerosis. No enlarged abdominal or pelvic lymph nodes. Reproductive: Status post hysterectomy. No adnexal masses. Other: None. Musculoskeletal: No acute or focal abnormality. Degenerative disease about the hips is worse on the left. IMPRESSION: Diverticulosis with superimposed acute  sigmoid diverticulitis. Small volume of fluid in the inferior aspect of the right pelvis appears simple but could represent phlegmon. Negative for drainable fluid collection. Cardiomegaly. Atherosclerosis. Electronically Signed   By: Inge Rise M.D.   On: 02/23/2018 15:11    Micro Results   Recent Results (from the past 240 hour(s))  Urine culture     Status: None   Collection Time: 02/23/18  2:20 PM  Result Value Ref Range Status   Specimen Description URINE, CLEAN CATCH  Final   Special Requests NONE  Final   Culture   Final    NO GROWTH Performed at [facility], [place] [address], [place], [place] [address]    Report Status 02/24/2018 FINAL  Final  Blood culture (routine x 2)     Status: None (Preliminary result)   Collection Time: 02/23/18  4:14 PM  Result Value Ref Range Status   Specimen Description BLOOD LEFT ANTECUBITAL  Final   Special Requests   Final    BOTTLES DRAWN AEROBIC AND ANAEROBIC Blood Culture adequate volume   Culture   Final    NO GROWTH 4 DAYS Performed at [facility], [place] [address] [place], [place] [address]    Report Status PENDING  Incomplete  Blood culture (routine x 2)     Status: None (Preliminary result)   Collection Time: 02/23/18  4:27 PM  Result Value Ref Range Status   Specimen Description BLOOD RIGHT HAND  Final   Special Requests   Final    BOTTLES DRAWN AEROBIC AND ANAEROBIC Blood Culture adequate volume   Culture   Final    NO GROWTH 4 DAYS Performed at [facility], [place] [address], [place],  [address]    Report Status PENDING  Incomplete  C difficile quick scan w PCR reflex     Status: None   Collection Time: 02/26/18  8:21 AM  Result Value Ref Range Status   C Diff antigen NEGATIVE NEGATIVE Final   C Diff toxin NEGATIVE NEGATIVE Final   C Diff interpretation No C. difficile detected.  Final    Comment: Performed at [facility], [place] [address] [place], [place]  [address]       Today    Subjective    [name] today has no complaints, tolerating solid food well, no fevers no chills no nausea no vomiting, only 2 loose BM in the last 12 hours, patient and husband are requesting discharge home          Patient has been seen and examined prior to discharge   Objective   Blood pressure 128/70, pulse (!) 50, temperature 98.1 F (36.7 C), temperature source Oral, resp. rate 20, height 5\' 6"  (1.676 m), weight 97.9 kg (215 lb 13.3 oz), SpO2 95 %.   Intake/Output Summary (Last 24 hours) at 02/27/2018 1853 Last data filed at 02/27/2018 1400 Gross per 24 hour  Intake 3979.13 ml  Output -  Net 3979.13 ml    Exam  Gen:- Awake Alert,  In no apparent distress  HEENT:- [place].AT, No sclera icterus Neck-Supple Neck,No JVD,.  Lungs-  CTAB , good air movement CV- S1, S2 normal, HR 50 bpm,  h/o Atrial fibrillation, but appears to be sinus at this time Abd-  +ve B.Sounds, Abd Soft,  LLQ abdominal tenderness has improved, no rebound or guarding, no CVA area tenderness Extremity/Skin:- No  edema,   Good pulses Psych-affect is appropriate, oriented x3 Neuro-no new focal deficits, no tremors     Data Review   CBC w Diff:  Lab Results  Component Value Date   WBC 4.9 02/27/2018   HGB 12.0 02/27/2018   HCT 36.9 02/27/2018   PLT 199 02/27/2018   LYMPHOPCT 42 07/01/2017   MONOPCT 5 07/01/2017   EOSPCT 3 07/01/2017   BASOPCT 1 07/01/2017    CMP:  Lab Results  Component Value Date   NA 143 02/27/2018   K 3.6 02/27/2018   CL 112 (H) 02/27/2018   CO2 26 02/27/2018   BUN <5 (L) 02/27/2018   CREATININE 0.67 02/27/2018   PROT 6.0 (L) 02/27/2018   ALBUMIN 2.7 (L) 02/27/2018   BILITOT 0.3 02/27/2018   ALKPHOS 123 02/27/2018   AST 21 02/27/2018   ALT 22 02/27/2018   NB!!! Phone call from [facility] pharmacist Christine--- concerns about possible proarrhythmic interaction between Cipro and flecainide...  Flagyl and Cipro discontinued,  verbal order to give Augmentin 875 p.o.  twice daily for 10 days for acute diverticulitis given to her pharmacist Altha Harm---- [phone]   Total Discharge time is about 33 minutes  Roxan Hockey M.D on 02/27/2018 at 6:53 PM  [facility]   Office  [phone]  Voice Recognition Viviann Spare dictation system was used to create this note, attempts have been made to correct errors. Please contact the author with questions and/or clarifications.

## 2018-02-27 NOTE — Discharge Instructions (Addendum)
1) take antibiotics as prescribed, absolutely no alcohol while taking metronidazole/Flagyl antibiotic 2) eat yogurt/probiotics--- may take over-the-counter probiotic tablets 3) use Imodium sparingly for diarrhea, if diarrhea persist please call 4) you need to follow-up with [facility] gastroenterology group (Dr Cristi Loron colonoscopy within the next 3 to 4 months 5)Avoid ibuprofen/Advil/Aleve/Motrin/Goody Powders/Naproxen/BC powders/Meloxicam/Diclofenac/Indomethacin and other Nonsteroidal anti-inflammatory medications as these will make you more likely to bleed and can cause stomach ulcers, can also cause Kidney problems.  6) soft diet advised   NB!!! Phone call from [facility] pharmacist Christine--- concerns about possible proarrhythmic interaction between Cipro and flecainide...  Flagyl and Cipro discontinued,  verbal order to give Augmentin 875 p.o. twice daily for 10 days for acute diverticulitis given to her pharmacist Altha Harm---- [phone]    Diverticulitis   Diverticulitis is when small pockets in your large intestine (colon) get infected or swollen. This causes stomach pain and watery poop (diarrhea). These pouches are called diverticula. They form in people who have a condition called diverticulosis. Follow these instructions at home: Medicines  Take over-the-counter and prescription medicines only as told by your doctor. These include: ? Antibiotics. ? Pain medicines. ? Fiber pills. ? Probiotics. ? Stool softeners.  Do not drive or use heavy machinery while taking prescription pain medicine.  If you were prescribed an antibiotic, take it as told. Do not stop taking it even if you feel better. General instructions  Follow a diet as told by your doctor.  When you feel better, your doctor may tell you to change your diet. You may need to eat a lot of fiber. Fiber makes it easier to poop (have bowel movements). Healthy foods with fiber  include: ? Berries. ? Beans. ? Lentils. ? Green vegetables.  Exercise 3 or more times a week. Aim for 30 minutes each time. Exercise enough to sweat and make your heart beat faster.  Keep all follow-up visits as told. This is important. You may need to have an exam of the large intestine. This is called a colonoscopy. Contact a doctor if:  Your pain does not get better.  You have a hard time eating or drinking.  You are not pooping like normal. Get help right away if:  Your pain gets worse.  Your problems do not get better.  Your problems get worse very fast.  You have a fever.  You throw up (vomit) more than one time.  You have poop that is: ? Bloody. ? Black. ? Tarry. Summary  Diverticulitis is when small pockets in your large intestine (colon) get infected or swollen.  Take medicines only as told by your doctor.  Follow a diet as told by your doctor. This information is not intended to replace advice given to you by your health care provider. Make sure you discuss any questions you have with your health care provider. Document Released: 01/29/2008 Document Revised: 08/29/2016 Document Reviewed: 08/29/2016 Elsevier Interactive Patient Education  2017 [place].  1) take antibiotics as prescribed, absolutely no alcohol while taking metronidazole/Flagyl antibiotic 2) eat yogurt/probiotics--- may take over-the-counter probiotic tablets 3) use Imodium sparingly for diarrhea, if diarrhea persist please call 4) you need to follow-up with [facility] gastroenterology group (Dr Cristi Loron colonoscopy within the next 3 to 4 months 5)Avoid ibuprofen/Advil/Aleve/Motrin/Goody Powders/Naproxen/BC powders/Meloxicam/Diclofenac/Indomethacin and other Nonsteroidal anti-inflammatory medications as these will make you more likely to bleed and can cause stomach ulcers, can also cause Kidney problems.  6) soft diet advised   NB!!! Phone call from [facility] pharmacist Altha Harm---  concerns about possible  proarrhythmic interaction between Cipro and flecainide...  Flagyl and Cipro discontinued,  verbal order to give Augmentin 875 p.o. twice daily for 10 days for acute diverticulitis given to her pharmacist Altha Harm---- [phone]

## 2018-02-27 NOTE — Progress Notes (Addendum)
[facility] Progress Note     Subjective: CC-  Continues to have loose stools, but they are becoming less frequent and a little more formed. C diff and u/a were negative but she still feels incontinent of stool and urine. States that she has mild LLQ discomfort. Denies n/v. Tolerating regular diet. Asking when she can go home.  Objective: Vital signs in last 24 hours: Temp:  [98 F (36.7 C)-98.2 F (36.8 C)] 98 F (36.7 C) (07/05 0438) Pulse Rate:  [56-65] 65 (07/05 0932) Resp:  [18] 18 (07/05 0932) BP: (83-112)/(50-78) 109/66 (07/05 0932) SpO2:  [96 %-97 %] 97 % (07/05 0438) Last BM Date: 02/26/18  Intake/Output from previous day: 07/04 0701 - 07/05 0700 In: 3312.5 [P.O.:880; I.V.:2196; IV Piggyback:236.5] Out: -  Intake/Output this shift: No intake/output data recorded.  PE: Gen:  Alert, NAD, pleasant HEENT: EOM's intact, pupils equal and round Pulm:  CTAB, no W/R/R, effort normal Abd: Soft, ND, subjective TTP LLQ, +BS, no HSM, no hernia Psych: A&Ox3  Skin: no rashes noted, warm and dry  Lab Results:  Recent Labs    02/25/18 0417 02/27/18 0433  WBC 5.8 4.9  HGB 12.9 12.0  HCT 39.0 36.9  PLT 189 199   BMET Recent Labs    02/25/18 0417 02/27/18 0433  NA 139 143  K 3.6 3.6  CL 107 112*  CO2 25 26  GLUCOSE 83 104*  BUN <5* <5*  CREATININE 0.67 0.67  CALCIUM 8.3* 8.2*   PT/INR No results for input(s): LABPROT, INR in the last 72 hours. CMP     Component Value Date/Time   NA 143 02/27/2018 0433   K 3.6 02/27/2018 0433   CL 112 (H) 02/27/2018 0433   CO2 26 02/27/2018 0433   GLUCOSE 104 (H) 02/27/2018 0433   BUN <5 (L) 02/27/2018 0433   CREATININE 0.67 02/27/2018 0433   CALCIUM 8.2 (L) 02/27/2018 0433   PROT 6.0 (L) 02/27/2018 0433   ALBUMIN 2.7 (L) 02/27/2018 0433   AST 21 02/27/2018 0433   ALT 22 02/27/2018 0433   ALKPHOS 123 02/27/2018 0433   BILITOT 0.3 02/27/2018 0433   GFRNONAA >60 02/27/2018 0433   GFRAA >60 02/27/2018  0433   Lipase     Component Value Date/Time   LIPASE 23 02/23/2018 1233       Studies/Results: No results found.  Anti-infectives: Anti-infectives (From admission, onward)   Start     Dose/Rate Route Frequency Ordered Stop   02/23/18 2300  piperacillin-tazobactam (ZOSYN) IVPB 3.375 g     3.375 g 12.5 mL/hr over 240 Minutes Intravenous Every 8 hours 02/23/18 1616     02/23/18 1545  piperacillin-tazobactam (ZOSYN) IVPB 3.375 g     3.375 g 100 mL/hr over 30 Minutes Intravenous  Once 02/23/18 1530 02/23/18 1848       Assessment/Plan Paroxysmal Afib - takes flecainide, metoprolol, and xarelto  Acute diverticulitis - CT 7/1 showed diverticulosis with superimposed acute sigmoid diverticulitis. Small volume of fluid in the inferior aspect of the right pelvis appears simple but could represent phlegmon. Negative for drainable fluid collection. - has been on zosyn since 7/1 - pain improving, WBC WNL, afebrile, still having loose stools - Will need GI follow up, planning to switch from [place] system to a GI physician in [place].  ID: Zosyn 7/1>>. U/a and c diff neg VTE: SCD's, Lovenox. FEN: reg diet  Plan: Give one dose of imodium and start on probiotic. Patient ok for discharge on  oral abx from our standpoint. She will try to obtain her colonoscopy report. Needs f/u with GI for colonoscopy in 6-8 weeks. Will arrange f/u in our office in a few weeks to ensure that she is improving. Ok to restart xarelto.   LOS: 4 days    Wellington [place] , [facility] 02/27/2018, 10:13 AM Pager: [phone] Consults: [phone] Mon 7:00 am -11:30 AM Tues-Fri 7:00 am-4:30 pm Sat-Sun 7:00 am-11:30 am

## 2018-02-27 NOTE — Progress Notes (Signed)
Pt discharge paperwork gone over in detail with patient and family at bedside. All medication questions answered to patients satisfaction. IV discontinued. Pt discharged to home with family.   Lucius Conn, RN

## 2018-02-27 NOTE — Progress Notes (Signed)
Phone call from [facility] pharmacist Altha Harm--- concerns about possible proarrhythmic interaction between Cipro and flecainide...  Flagyl and Cipro discontinued,  verbal order to give Augmentin 875 p.o. twice daily for 10 days for acute diverticulitis given to her pharmacist Christine---- [phone]  Roxan Hockey, MD

## 2018-02-28 LAB — CULTURE, BLOOD (ROUTINE X 2)
CULTURE: NO GROWTH
Culture: NO GROWTH
SPECIAL REQUESTS: ADEQUATE
SPECIAL REQUESTS: ADEQUATE

## 2018-03-04 DIAGNOSIS — K5792 Diverticulitis of intestine, part unspecified, without perforation or abscess without bleeding: Secondary | ICD-10-CM | POA: Diagnosis not present

## 2018-03-04 DIAGNOSIS — Z1389 Encounter for screening for other disorder: Secondary | ICD-10-CM | POA: Diagnosis not present

## 2018-03-04 DIAGNOSIS — B379 Candidiasis, unspecified: Secondary | ICD-10-CM | POA: Diagnosis not present

## 2018-03-04 DIAGNOSIS — I4891 Unspecified atrial fibrillation: Secondary | ICD-10-CM | POA: Diagnosis not present

## 2018-03-04 DIAGNOSIS — N329 Bladder disorder, unspecified: Secondary | ICD-10-CM | POA: Diagnosis not present

## 2018-03-16 ENCOUNTER — Ambulatory Visit (INDEPENDENT_AMBULATORY_CARE_PROVIDER_SITE_OTHER): Payer: BLUE CROSS/BLUE SHIELD | Admitting: Orthopaedic Surgery

## 2018-03-16 ENCOUNTER — Emergency Department (HOSPITAL_COMMUNITY)
Admission: EM | Admit: 2018-03-16 | Discharge: 2018-03-16 | Disposition: A | Discharge status: Home / Self Care | Payer: BLUE CROSS/BLUE SHIELD | Attending: Emergency Medicine | Admitting: Emergency Medicine

## 2018-03-16 ENCOUNTER — Encounter (HOSPITAL_COMMUNITY): Payer: Self-pay | Admitting: *Deleted

## 2018-03-16 ENCOUNTER — Emergency Department (HOSPITAL_COMMUNITY): Payer: BLUE CROSS/BLUE SHIELD

## 2018-03-16 DIAGNOSIS — W010XXA Fall on same level from slipping, tripping and stumbling without subsequent striking against object, initial encounter: Secondary | ICD-10-CM | POA: Insufficient documentation

## 2018-03-16 DIAGNOSIS — Z9049 Acquired absence of other specified parts of digestive tract: Secondary | ICD-10-CM | POA: Insufficient documentation

## 2018-03-16 DIAGNOSIS — Y9389 Activity, other specified: Secondary | ICD-10-CM | POA: Diagnosis not present

## 2018-03-16 DIAGNOSIS — Z79899 Other long term (current) drug therapy: Secondary | ICD-10-CM | POA: Diagnosis not present

## 2018-03-16 DIAGNOSIS — F419 Anxiety disorder, unspecified: Secondary | ICD-10-CM | POA: Diagnosis not present

## 2018-03-16 DIAGNOSIS — Z96652 Presence of left artificial knee joint: Secondary | ICD-10-CM | POA: Diagnosis not present

## 2018-03-16 DIAGNOSIS — S32592A Other specified fracture of left pubis, initial encounter for closed fracture: Secondary | ICD-10-CM

## 2018-03-16 DIAGNOSIS — Z87891 Personal history of nicotine dependence: Secondary | ICD-10-CM | POA: Diagnosis not present

## 2018-03-16 DIAGNOSIS — S32502A Unspecified fracture of left pubis, initial encounter for closed fracture: Secondary | ICD-10-CM | POA: Insufficient documentation

## 2018-03-16 DIAGNOSIS — Z471 Aftercare following joint replacement surgery: Secondary | ICD-10-CM | POA: Diagnosis not present

## 2018-03-16 DIAGNOSIS — Z7901 Long term (current) use of anticoagulants: Secondary | ICD-10-CM | POA: Insufficient documentation

## 2018-03-16 DIAGNOSIS — Y998 Other external cause status: Secondary | ICD-10-CM | POA: Diagnosis not present

## 2018-03-16 DIAGNOSIS — Y929 Unspecified place or not applicable: Secondary | ICD-10-CM | POA: Diagnosis not present

## 2018-03-16 DIAGNOSIS — S79912A Unspecified injury of left hip, initial encounter: Secondary | ICD-10-CM | POA: Diagnosis not present

## 2018-03-16 DIAGNOSIS — S32512A Fracture of superior rim of left pubis, initial encounter for closed fracture: Secondary | ICD-10-CM | POA: Diagnosis not present

## 2018-03-16 LAB — CBC WITH DIFFERENTIAL/PLATELET
Abs Immature Granulocytes: 0.1 10*3/uL (ref 0.0–0.1)
Basophils Absolute: 0.1 10*3/uL (ref 0.0–0.1)
Basophils Relative: 1 %
Eosinophils Absolute: 0.1 10*3/uL (ref 0.0–0.7)
Eosinophils Relative: 1 %
HCT: 45.7 % (ref 36.0–46.0)
Hemoglobin: 14.9 g/dL (ref 12.0–15.0)
Immature Granulocytes: 1 %
Lymphocytes Relative: 31 %
Lymphs Abs: 2.9 10*3/uL (ref 0.7–4.0)
MCH: 31.4 pg (ref 26.0–34.0)
MCHC: 32.6 g/dL (ref 30.0–36.0)
MCV: 96.2 fL (ref 78.0–100.0)
Monocytes Absolute: 0.5 10*3/uL (ref 0.1–1.0)
Monocytes Relative: 5 %
Neutro Abs: 5.9 10*3/uL (ref 1.7–7.7)
Neutrophils Relative %: 61 %
Platelets: 242 10*3/uL (ref 150–400)
RBC: 4.75 MIL/uL (ref 3.87–5.11)
RDW: 12.7 % (ref 11.5–15.5)
WBC: 9.5 10*3/uL (ref 4.0–10.5)

## 2018-03-16 MED ORDER — OXYCODONE-ACETAMINOPHEN 5-325 MG PO TABS
1.0000 | ORAL_TABLET | Freq: Once | ORAL | Status: AC
  Administered 2018-03-16: 1 via ORAL
  Filled 2018-03-16: qty 1

## 2018-03-16 MED ORDER — OXYCODONE-ACETAMINOPHEN 5-325 MG PO TABS: 1.0000 | ORAL_TABLET | Freq: Four times a day (QID) | ORAL | 0 refills | Status: DC | PRN

## 2018-03-16 MED ORDER — HYDROMORPHONE HCL 1 MG/ML IJ SOLN
0.5000 mg | Freq: Once | INTRAMUSCULAR | Status: AC
  Administered 2018-03-16: 0.5 mg via INTRAMUSCULAR
  Filled 2018-03-16: qty 1

## 2018-03-16 NOTE — ED Provider Notes (Signed)
[facility]ERG[facility] DEPARTMENT Provider Note   CSN: [id number] Arrival date & time: 03/16/18  1302     History   Chief Complaint Chief Complaint  Patient presents with  . Fall    HPI [name] is a 57 y.o. female.  HPI   57 year old female presents today status post fall. Patient notes she felt like her left knee gave out causing to her to fall down on her left hip. Patient notes pain to the anterior hip and pelvis. She notes she is unable to ambulate secondary to pain. Patient denies any abdominal pain, denies any blood in her urine. No distal neurological deficits. No other injuries. Status post total knee on 07/10/2017 performed by Dr. Erlinda Hong.   Past Medical History:  Diagnosis Date  . A-fib ([place])   . Anxiety   . Arthritis   . Dysrhythmia    hx of afib, afib ablation in 01/2014, no afib since then  . Pneumonia    as a child    Patient Active Problem List   Diagnosis Date Noted  . Acute diverticulitis 02/23/2018  . A-fib ([place]) 02/23/2018  . Anxiety 02/23/2018  . Heme positive stool 02/23/2018  . Urinary incontinence 02/23/2018  . S/P TKR (total knee replacement), left 07/10/2017  . Unilateral primary osteoarthritis, left knee 06/23/2017  . Chondromalacia of left patella 01/16/2017  . TOBACCO ABUSE 10/25/2008  . PALPITATIONS 10/25/2008  . DYSPNEA 10/25/2008    Past Surgical History:  Procedure Laterality Date  . ABDOMINAL HYSTERECTOMY    . BLADDER SURGERY  2000  . CARDIAC ELECTROPHYSIOLOGY MAPPING AND ABLATION     for afib  . CESAREAN SECTION    . CHOLECYSTECTOMY    . fallopian tube removal    . loop recorder explant  2015  . LOOP RECORDER IMPLANT  2015  . TONSILLECTOMY AND ADENOIDECTOMY    . TOTAL KNEE ARTHROPLASTY Left 07/10/2017   Procedure: LEFT TOTAL KNEE ARTHROPLASTY;  Surgeon: Leandrew Koyanagi, MD;  Location: [facility];  Service: Orthopedics;  Laterality: Left;  Marland Kitchen VAGINAL HYSTERECTOMY       OB History   None      Home  Medications    Prior to Admission medications   Medication Sig Start Date End Date Taking? Authorizing Provider  ALPRAZolam Duanne Moron) 0.5 MG tablet Take 0.25 mg by mouth daily as needed for anxiety.   Yes [provider]  flecainide (TAMBOCOR) 50 MG tablet Take 50 mg by mouth 2 (two) times daily.   Yes [provider]  lactobacillus acidophilus (BACID) TABS tablet Take 2 tablets by mouth 3 (three) times daily. Patient taking differently: Take 2 tablets by mouth daily.  02/27/18  Yes Emokpae, Courage, MD  loperamide (IMODIUM A-D) 2 MG tablet Take 1 tablet (2 mg total) by mouth 3 (three) times daily as needed for diarrhea or loose stools. 02/27/18  Yes Emokpae, Courage, MD  Melatonin 10 MG TABS Take 10 mg by mouth at bedtime as needed (for sleep).    Yes [provider]  metoprolol tartrate (LOPRESSOR) 25 MG tablet Take 0.5 tablets (12.5 mg total) by mouth 2 (two) times daily. Patient taking differently: Take 25 mg by mouth 2 (two) times daily.  02/27/18  Yes Emokpae, Courage, MD  XARELTO 20 MG TABS tablet Take 20 mg by mouth daily with supper.  12/01/17  Yes [provider]  oxyCODONE-acetaminophen (PERCOCET/ROXICET) 5-325 MG tablet Take 1-2 tablets by mouth every 6 (six) hours as needed for severe pain.  03/16/18   Okey Regal, PA-C    Family History History reviewed. No pertinent family history.  Social History Social History   Tobacco Use  . Smoking status: Former Smoker    Packs/day: 0.50    Types: Cigarettes    Last attempt to quit: 07/10/2017    Years since quitting: 0.6  . Smokeless tobacco: Never Used  . Tobacco comment: STATES SHE QUIT SMOKING 5 MONTHS AGO.  Substance Use Topics  . Alcohol use: No    Frequency: Never  . Drug use: No     Allergies   Patient has no known allergies.   Review of Systems Review of Systems  All other systems reviewed and are negative.  Physical Exam Updated Vital Signs BP 109/77   Pulse 72   Temp 98.4 F  (36.9 C) (Oral)   Resp 20   SpO2 99%   Physical Exam  Constitutional: She is oriented to person, place, and time. She appears well-developed and well-nourished.  HENT:  Head: Normocephalic and atraumatic.  Eyes: Pupils are equal, round, and reactive to light. Conjunctivae are normal. Right eye exhibits no discharge. Left eye exhibits no discharge. No scleral icterus.  Neck: Normal range of motion. No JVD present. No tracheal deviation present.  Pulmonary/Chest: Effort normal. No stridor.  Abdominal: Soft. She exhibits no distension and no mass. There is no tenderness. There is no guarding.  Musculoskeletal:  Hips nontender and stable with lateral compression, tenderness palpation of the left pubic ramus, range of motion of the left lower extremity with pain, distal sensation and strength intact  Neurological: She is alert and oriented to person, place, and time. Coordination normal.  Psychiatric: She has a normal mood and affect. Her behavior is normal. Judgment and thought content normal.  Nursing note and vitals reviewed.   ED Treatments / Results  Labs (all labs ordered are listed, but only abnormal results are displayed) Labs Reviewed  CBC WITH DIFFERENTIAL/PLATELET    EKG None  Radiology Ct Hip Left Wo Contrast  Result Date: 03/16/2018 CLINICAL DATA:  Left hip pain with inability to bear weight after fall. EXAM: CT OF THE LEFT HIP WITHOUT CONTRAST TECHNIQUE: Multidetector CT imaging of the left hip was performed according to the standard protocol. Multiplanar CT image reconstructions were also generated. COMPARISON:  Left hip x-rays from same day. CT abdomen pelvis dated February 23, 2018. FINDINGS: Bones/Joint/Cartilage Nondisplaced fracture of the left parasymphyseal superior pubic ramus. Mild left hip joint space narrowing with small marginal osteophytes. Unchanged mild osteoarthritis of the left sacroiliac joint. Ligaments Suboptimally assessed by CT. Muscles and Tendons  Unremarkable. Soft tissues Small amount of extraperitoneal hematoma posterior to the pubic symphysis and anterior to the bladder neck. Essentially resolved sigmoid diverticulitis. IMPRESSION: 1. Nondisplaced fracture of the left parasymphyseal superior pubic ramus with adjacent small volume extraperitoneal hematoma anterior to the bladder neck. 2. Mild left hip osteoarthritis. 3. Essentially resolved sigmoid diverticulitis. Electronically Signed   By: Titus Dubin M.D.   On: 03/16/2018 18:06   Dg Hip Unilat With Pelvis 2-3 Views Left  Result Date: 03/16/2018 CLINICAL DATA:  Fall. Total knee replacement in November of 2018. Pain across LEFT hip. Unable to raise leg or move it much, severe pain. EXAM: DG HIP (WITH OR WITHOUT PELVIS) 2-3V LEFT COMPARISON:  Plain film of the lumbar spine dated 12/02/2017. FINDINGS: Osseous structures of the pelvis and about the LEFT hip appear intact and normally aligned. No fracture line or displaced fracture fragment seen. Soft tissues about  the pelvis and LEFT hip are unremarkable. IMPRESSION: Negative. Electronically Signed   By: Franki Cabot M.D.   On: 03/16/2018 13:49    Procedures Procedures (including critical care time)  Medications Ordered in ED Medications  HYDROmorphone (DILAUDID) injection 0.5 mg (0.5 mg Intramuscular Given 03/16/18 1618)  oxyCODONE-acetaminophen (PERCOCET/ROXICET) 5-325 MG per tablet 1 tablet (1 tablet Oral Given 03/16/18 1846)     Initial Impression / Assessment and Plan / ED Course  I have reviewed the triage vital signs and the nursing notes.  Pertinent labs & imaging results that were available during my care of the patient were reviewed by me and considered in my medical decision making (see chart for details).     Labs: CBC  Imaging:CT hip left without, DG Left  Consults: Dr. Louanne Skye   Therapeutics: Dilaudid, Percocet  Discharge Meds: Percocet  Assessment/Plan: 57 year old female presents today with ramus fracture.  Patient uncomfortable here, treated with pain medicine. Consult at Dr. Louanne Skye of orthopedics. After discussion with both him and the patient, patient would like to attempt at home management. I do find this reasonable given her overall health status. She has help at home and has all necessary tools including bedpans, bedside commode sent a walker. I did give the patient an option for hospitalization for pain management, she would like to go home. Patient has a history A. Fib but is not currently in A. Fib and is taking Xarelto. I did recommend she hold Xarelto for the next day. Patient will return immediately if she develops any new or worsening signs or symptoms. Baseline CBC was obtained and patient was discharged. Strict precautions given, she verbalized understanding and agreement to today's plan had no further questions or concerns at the time of discharge.    Final Clinical Impressions(s) / ED Diagnoses   Final diagnoses:  Closed fracture of ramus of left pubis, initial encounter [facility])    ED Discharge Orders        Ordered    oxyCODONE-acetaminophen (PERCOCET/ROXICET) 5-325 MG tablet  Every 6 hours PRN     03/16/18 1935       Francee Gentile 03/16/18 2015    Isla Pence, MD 03/17/18 1606

## 2018-03-16 NOTE — ED Provider Notes (Signed)
Patient placed in Quick Look pathway, seen and evaluated   Chief Complaint: left hip pain fall  HPI:   Pt fell at work pain left hip  ROS: no impact of head no knee pain  Physical Exam:   Gen: No distress  Neuro: Awake and Alert  Skin: Warm    Focused Exam: tender left hip   Initiation of care has begun. The patient has been counseled on the process, plan, and necessity for staying for the completion/evaluation, and the remainder of the medical screening examination   Sidney Ace 03/16/18 1333    Duffy Bruce, MD 03/16/18 2023

## 2018-03-16 NOTE — ED Triage Notes (Signed)
Pt in stating she fell PTA and landed on her left hip, unable to bear any weight on that leg since the fall, increased pain with movement

## 2018-03-16 NOTE — ED Notes (Signed)
Pt in ct 

## 2018-03-16 NOTE — Discharge Instructions (Addendum)
Please read attached information. If you experience any new or worsening signs or symptoms please return to the emergency room for evaluation. Please follow-up with your primary care provider or specialist as discussed. Please use medication prescribed only as directed and discontinue taking if you have any concerning signs or symptoms.   °

## 2018-03-18 ENCOUNTER — Ambulatory Visit (INDEPENDENT_AMBULATORY_CARE_PROVIDER_SITE_OTHER): Payer: BLUE CROSS/BLUE SHIELD | Admitting: Orthopaedic Surgery

## 2018-03-18 ENCOUNTER — Encounter (INDEPENDENT_AMBULATORY_CARE_PROVIDER_SITE_OTHER): Payer: Self-pay | Admitting: Orthopaedic Surgery

## 2018-03-18 DIAGNOSIS — S32592A Other specified fracture of left pubis, initial encounter for closed fracture: Secondary | ICD-10-CM | POA: Diagnosis not present

## 2018-03-18 MED ORDER — OXYCODONE-ACETAMINOPHEN 5-325 MG PO TABS: 1.0000 | ORAL_TABLET | Freq: Four times a day (QID) | ORAL | 0 refills | Status: DC | PRN

## 2018-03-18 NOTE — Progress Notes (Signed)
Office Visit Note   Patient: [name]           Date of Birth: 1961-01-14           MRN: [id number] Visit Date: 03/18/2018              Requested by: Velna Hatchet, MD [address] [place], [place] [address] PCP: Velna Hatchet, MD   Assessment & Plan: Visit Diagnoses:  1. Pubic ramus fracture, left, closed, initial encounter [facility])     Plan: Impression is nondisplaced pubic ramus fracture.  Healing time and recovery discussed with the patient.  Weight-bear as tolerated.  Percocet refilled.  Patient will take calcium and vitamin D.  Follow-up in 6 weeks for recheck.  Need standing AP pelvis x-ray on return.  Follow-Up Instructions: Return in about 6 weeks (around 04/29/2018).   Orders:  No orders of the defined types were placed in this encounter.  Meds ordered this encounter  Medications  . oxyCODONE-acetaminophen (PERCOCET) 5-325 MG tablet    Sig: Take 1-2 tablets by mouth every 6 (six) hours as needed for severe pain.    Dispense:  30 tablet    Refill:  0      Procedures: No procedures performed   Clinical Data: No additional findings.   Subjective: Chief Complaint  Patient presents with  . Left Hip - Pain    [name] is a very pleasant 57 year old female who sustained a mechanical fall couple days of her left side.  She was evaluated in the ED for severe pain.  CT showed a nondisplaced pubic ramus fracture.  She was discharged home.  She has significant pain with weightbearing and ambulation.  She has no pain at rest.   Review of Systems  Constitutional: Negative.   HENT: Negative.   Eyes: Negative.   Respiratory: Negative.   Cardiovascular: Negative.   Endocrine: Negative.   Musculoskeletal: Negative.   Neurological: Negative.   Hematological: Negative.   Psychiatric/Behavioral: Negative.   All other systems reviewed and are negative.    Objective: Vital Signs: There were no vitals taken for this visit.  Physical Exam  Constitutional:  She is oriented to person, place, and time. She appears well-developed and well-nourished.  Pulmonary/Chest: Effort normal.  Neurological: She is alert and oriented to person, place, and time.  Skin: Skin is warm. Capillary refill takes less than 2 seconds.  Psychiatric: She has a normal mood and affect. Her behavior is normal. Judgment and thought content normal.  Nursing note and vitals reviewed.   Ortho Exam Left hip and pelvis exam shows tenderness along the pelvic rim.  She does endorse groin pain.  Hip rotation is not significantly painful. Specialty Comments:  No specialty comments available.  Imaging: No results found.   PMFS History: Patient Active Problem List   Diagnosis Date Noted  . Acute diverticulitis 02/23/2018  . A-fib ([place]) 02/23/2018  . Anxiety 02/23/2018  . Heme positive stool 02/23/2018  . Urinary incontinence 02/23/2018  . S/P TKR (total knee replacement), left 07/10/2017  . Unilateral primary osteoarthritis, left knee 06/23/2017  . Chondromalacia of left patella 01/16/2017  . TOBACCO ABUSE 10/25/2008  . PALPITATIONS 10/25/2008  . DYSPNEA 10/25/2008   Past Medical History:  Diagnosis Date  . A-fib ([place])   . Anxiety   . Arthritis   . Dysrhythmia    hx of afib, afib ablation in 01/2014, no afib since then  . Pneumonia    as a child    History reviewed. No  pertinent family history.  Past Surgical History:  Procedure Laterality Date  . ABDOMINAL HYSTERECTOMY    . BLADDER SURGERY  2000  . CARDIAC ELECTROPHYSIOLOGY MAPPING AND ABLATION     for afib  . CESAREAN SECTION    . CHOLECYSTECTOMY    . fallopian tube removal    . loop recorder explant  2015  . LOOP RECORDER IMPLANT  2015  . TONSILLECTOMY AND ADENOIDECTOMY    . TOTAL KNEE ARTHROPLASTY Left 07/10/2017   Procedure: LEFT TOTAL KNEE ARTHROPLASTY;  Surgeon: Leandrew Koyanagi, MD;  Location: [facility];  Service: Orthopedics;  Laterality: Left;  Marland Kitchen VAGINAL HYSTERECTOMY     Social History    Occupational History  . Occupation: Glass blower/designer  Tobacco Use  . Smoking status: Former Smoker    Packs/day: 0.50    Types: Cigarettes    Last attempt to quit: 07/10/2017    Years since quitting: 0.6  . Smokeless tobacco: Never Used  . Tobacco comment: STATES SHE QUIT SMOKING 5 MONTHS AGO.  Substance and Sexual Activity  . Alcohol use: No    Frequency: Never  . Drug use: No  . Sexual activity: Yes    Birth control/protection: Surgical

## 2018-03-24 ENCOUNTER — Ambulatory Visit (INDEPENDENT_AMBULATORY_CARE_PROVIDER_SITE_OTHER): Payer: BLUE CROSS/BLUE SHIELD | Admitting: Orthopaedic Surgery

## 2018-03-31 ENCOUNTER — Telehealth (INDEPENDENT_AMBULATORY_CARE_PROVIDER_SITE_OTHER): Payer: Self-pay | Admitting: Orthopaedic Surgery

## 2018-03-31 NOTE — Telephone Encounter (Signed)
PLEASE ADVISE,

## 2018-03-31 NOTE — Telephone Encounter (Signed)
Ok to refill 

## 2018-03-31 NOTE — Telephone Encounter (Signed)
Patient called needing Rx refilled (Tramadol) The number to contact patient is [phone]

## 2018-04-01 ENCOUNTER — Other Ambulatory Visit (INDEPENDENT_AMBULATORY_CARE_PROVIDER_SITE_OTHER): Payer: Self-pay

## 2018-04-01 MED ORDER — TRAMADOL HCL 50 MG PO TABS: 50.0000 mg | ORAL_TABLET | Freq: Two times a day (BID) | ORAL | 0 refills | Status: DC

## 2018-04-01 NOTE — Telephone Encounter (Signed)
Called into pharm. Patient is aware.

## 2018-04-13 DIAGNOSIS — Z0189 Encounter for other specified special examinations: Secondary | ICD-10-CM | POA: Diagnosis not present

## 2018-04-13 DIAGNOSIS — Z8679 Personal history of other diseases of the circulatory system: Secondary | ICD-10-CM | POA: Diagnosis not present

## 2018-04-13 DIAGNOSIS — Z9889 Other specified postprocedural states: Secondary | ICD-10-CM | POA: Diagnosis not present

## 2018-04-13 DIAGNOSIS — I480 Paroxysmal atrial fibrillation: Secondary | ICD-10-CM | POA: Diagnosis not present

## 2018-04-15 DIAGNOSIS — I480 Paroxysmal atrial fibrillation: Secondary | ICD-10-CM | POA: Diagnosis not present

## 2018-04-29 ENCOUNTER — Ambulatory Visit (INDEPENDENT_AMBULATORY_CARE_PROVIDER_SITE_OTHER): Payer: Self-pay

## 2018-04-29 ENCOUNTER — Encounter (INDEPENDENT_AMBULATORY_CARE_PROVIDER_SITE_OTHER): Payer: Self-pay | Admitting: Orthopaedic Surgery

## 2018-04-29 ENCOUNTER — Ambulatory Visit (INDEPENDENT_AMBULATORY_CARE_PROVIDER_SITE_OTHER): Payer: BLUE CROSS/BLUE SHIELD | Admitting: Orthopaedic Surgery

## 2018-04-29 DIAGNOSIS — Z96652 Presence of left artificial knee joint: Secondary | ICD-10-CM

## 2018-04-29 DIAGNOSIS — S32592A Other specified fracture of left pubis, initial encounter for closed fracture: Secondary | ICD-10-CM

## 2018-04-29 NOTE — Progress Notes (Signed)
Office Visit Note   Patient: [name]           Date of Birth: 09/21/1960           MRN: [id number] Visit Date: 04/29/2018              Requested by: Velna Hatchet, MD [address] [place], [place] [address] PCP: Velna Hatchet, MD   Assessment & Plan: Visit Diagnoses:  1. Pubic ramus fracture, left, closed, initial encounter ([place])   2. S/P TKR (total knee replacement), left     Plan: Impression is healing pubic ramus fractures and clinically doing very well.  She continues to complain of her left knee which upon further investigation is not actually related to her knee is more related to her soft tissue swelling.  She states that she is overall very happy with her knee replacement.  We will obtain a Doppler to rule out DVT.  She seems to be very concerned about the persistent swelling so therefore we will refer her to a vascular surgeon for further evaluation.  I would like to see her back in a couple months for her one year anniversary of her total knee replacement.  She will need 2 view x-rays of the left knee on return.  Follow-Up Instructions: Return in about 2 months (around 06/29/2018).   Orders:  Orders Placed This Encounter  Procedures  . XR Pelvis 1-2 Views  . XR Knee 1-2 Views Left  . Ambulatory referral to Vascular Surgery   No orders of the defined types were placed in this encounter.     Procedures: No procedures performed   Clinical Data: No additional findings.   Subjective: Chief Complaint  Patient presents with  . Pelvis - Follow-up    Patient follows up today for her pelvic ring fracture.  Overall she is doing much better.  She still complains of swelling in her left lower extremity.  Denies any constitutional symptoms.  She has not been wearing compression socks.   Review of Systems   Objective: Vital Signs: There were no vitals taken for this visit.  Physical Exam  Ortho Exam Left knee exam shows a fully healed surgical scar and  excellent range of motion.  Collaterals are stable.  Patella tracking is normal.  She does have some areas around the the scar that shows some subcutaneous swelling.  Overall I do not notice a huge amount of asymmetry but I do notice some increased swelling.  She has minimal pitting edema.  Bilateral the strong distal pulses. Specialty Comments:  No specialty comments available.  Imaging: Xr Knee 1-2 Views Left  Result Date: 04/29/2018 Stable uncemented left total knee replacement without any evidence of complication.  Xr Pelvis 1-2 Views  Result Date: 04/29/2018 Consolidation of pubic ramus fractures.  No disruption of the pelvic ring.    PMFS History: Patient Active Problem List   Diagnosis Date Noted  . Acute diverticulitis 02/23/2018  . A-fib ([place]) 02/23/2018  . Anxiety 02/23/2018  . Heme positive stool 02/23/2018  . Urinary incontinence 02/23/2018  . S/P TKR (total knee replacement), left 07/10/2017  . Unilateral primary osteoarthritis, left knee 06/23/2017  . Chondromalacia of left patella 01/16/2017  . TOBACCO ABUSE 10/25/2008  . PALPITATIONS 10/25/2008  . DYSPNEA 10/25/2008   Past Medical History:  Diagnosis Date  . A-fib ([place])   . Anxiety   . Arthritis   . Dysrhythmia    hx of afib, afib ablation in 01/2014, no afib since then  .  Pneumonia    as a child  . Sigmoid diverticulitis     No family history on file.  Past Surgical History:  Procedure Laterality Date  . ABDOMINAL HYSTERECTOMY    . BLADDER SURGERY  2000  . CARDIAC ELECTROPHYSIOLOGY MAPPING AND ABLATION     for afib  . CESAREAN SECTION    . CHOLECYSTECTOMY    . fallopian tube removal    . loop recorder explant  2015  . LOOP RECORDER IMPLANT  2015  . TONSILLECTOMY AND ADENOIDECTOMY    . TOTAL KNEE ARTHROPLASTY Left 07/10/2017   Procedure: LEFT TOTAL KNEE ARTHROPLASTY;  Surgeon: Leandrew Koyanagi, MD;  Location: [facility];  Service: Orthopedics;  Laterality: Left;  Marland Kitchen VAGINAL HYSTERECTOMY     Social History    Occupational History  . Occupation: Glass blower/designer  Tobacco Use  . Smoking status: Former Smoker    Packs/day: 0.50    Types: Cigarettes    Last attempt to quit: 07/10/2017    Years since quitting: 0.8  . Smokeless tobacco: Never Used  . Tobacco comment: STATES SHE QUIT SMOKING 5 MONTHS AGO.  Substance and Sexual Activity  . Alcohol use: No    Frequency: Never  . Drug use: No  . Sexual activity: Yes    Birth control/protection: Surgical

## 2018-04-30 ENCOUNTER — Ambulatory Visit (HOSPITAL_COMMUNITY)
Admission: RE | Admit: 2018-04-30 | Discharge: 2018-04-30 | Disposition: A | Discharge status: Home / Self Care | Payer: BLUE CROSS/BLUE SHIELD | Source: Ambulatory Visit | Attending: Orthopaedic Surgery | Admitting: Orthopaedic Surgery

## 2018-04-30 DIAGNOSIS — S32592A Other specified fracture of left pubis, initial encounter for closed fracture: Secondary | ICD-10-CM | POA: Diagnosis not present

## 2018-04-30 DIAGNOSIS — Z96652 Presence of left artificial knee joint: Secondary | ICD-10-CM | POA: Diagnosis not present

## 2018-04-30 DIAGNOSIS — X58XXXA Exposure to other specified factors, initial encounter: Secondary | ICD-10-CM | POA: Diagnosis not present

## 2018-04-30 NOTE — Progress Notes (Signed)
*  Preliminary Results* Left lower extremity venous duplex completed. Left lower extremity is negative for deep vein thrombosis. There is no evidence of left Baker's cyst.  04/30/2018 2:32 PM  [name]

## 2018-05-04 DIAGNOSIS — R82998 Other abnormal findings in urine: Secondary | ICD-10-CM | POA: Diagnosis not present

## 2018-05-04 DIAGNOSIS — Z13820 Encounter for screening for osteoporosis: Secondary | ICD-10-CM | POA: Diagnosis not present

## 2018-05-04 DIAGNOSIS — Z0000 Encounter for general adult medical examination without abnormal findings: Secondary | ICD-10-CM | POA: Diagnosis not present

## 2018-05-08 ENCOUNTER — Other Ambulatory Visit: Payer: Self-pay

## 2018-05-08 DIAGNOSIS — R600 Localized edema: Secondary | ICD-10-CM

## 2018-05-11 DIAGNOSIS — R82993 Hyperuricosuria: Secondary | ICD-10-CM | POA: Diagnosis not present

## 2018-05-11 DIAGNOSIS — Z0000 Encounter for general adult medical examination without abnormal findings: Secondary | ICD-10-CM | POA: Diagnosis not present

## 2018-05-11 DIAGNOSIS — Z23 Encounter for immunization: Secondary | ICD-10-CM | POA: Diagnosis not present

## 2018-05-11 DIAGNOSIS — M859 Disorder of bone density and structure, unspecified: Secondary | ICD-10-CM | POA: Diagnosis not present

## 2018-05-11 DIAGNOSIS — I4891 Unspecified atrial fibrillation: Secondary | ICD-10-CM | POA: Diagnosis not present

## 2018-05-11 DIAGNOSIS — F330 Major depressive disorder, recurrent, mild: Secondary | ICD-10-CM | POA: Diagnosis not present

## 2018-05-12 ENCOUNTER — Other Ambulatory Visit: Payer: Self-pay | Admitting: Internal Medicine

## 2018-05-12 DIAGNOSIS — Z1231 Encounter for screening mammogram for malignant neoplasm of breast: Secondary | ICD-10-CM

## 2018-05-13 DIAGNOSIS — Z1212 Encounter for screening for malignant neoplasm of rectum: Secondary | ICD-10-CM | POA: Diagnosis not present

## 2018-05-19 ENCOUNTER — Telehealth: Payer: Self-pay

## 2018-05-19 ENCOUNTER — Encounter: Payer: Self-pay | Admitting: Internal Medicine

## 2018-05-19 ENCOUNTER — Ambulatory Visit: Payer: BLUE CROSS/BLUE SHIELD | Admitting: Internal Medicine

## 2018-05-19 VITALS — BP 90/68 | HR 60 | Ht 66.0 in | Wt 216.6 lb

## 2018-05-19 DIAGNOSIS — I480 Paroxysmal atrial fibrillation: Secondary | ICD-10-CM

## 2018-05-19 DIAGNOSIS — K5792 Diverticulitis of intestine, part unspecified, without perforation or abscess without bleeding: Secondary | ICD-10-CM

## 2018-05-19 DIAGNOSIS — Z1211 Encounter for screening for malignant neoplasm of colon: Secondary | ICD-10-CM | POA: Diagnosis not present

## 2018-05-19 DIAGNOSIS — Z7901 Long term (current) use of anticoagulants: Secondary | ICD-10-CM | POA: Diagnosis not present

## 2018-05-19 NOTE — Progress Notes (Addendum)
[name] 57 y.o. 10-26-1960 [id number] Referred by Toni Hockey, MD Assessment & Plan:   Encounter Diagnoses  Name Primary?  . Acute diverticulitis - resolved Yes  . Colon cancer screening   . Long term current use of anticoagulant - Xarelto   . Paroxysmal atrial fibrillation (HCC)    So she needs a screening colonoscopy and that is been unsuccessful twice.  Diverticulitis is very unlikely to be linked with neoplasm or malignancy though rarely it has been.  It sounds to me like she may have stenosis of the sigmoid colon related to the diverticulosis and then perhaps some redundancy, I have reviewed the images of her previous CT scan and it does appear that she has some redundancy to her colon.  I am inclined to have her be done at the hospital or we have an ultraslim colonoscope that would allow passage through a stenotic sigmoid and hopefully reaching the cecum.  I will plan to have an abdominal binder on hand in case I need that to apply pressure.  She should hold her Xarelto 2 days prior to that and we will clarify with cardiology.  The risks and benefits as well as alternatives of endoscopic procedure(s) have been discussed and reviewed. All questions answered. The patient agrees to proceed.   Will hold Xarelto to days prior to endoscopic procedures - will instruct when and how to resume after procedure. Benefits and risks of procedure explained including risks of bleeding, perforation, infection, missed lesions, reactions to medications and possible need for hospitalization and surgery for complications. Additional rare but real risk of stroke or other vascular clotting events off Xarelto also explained and need to seek urgent help if any signs of these problems occur. Will communicate by phone or EMR with patient's  prescribing provider to confirm that holding Xarelto is reasonable in this case.   I have requested the records from her previous colonoscopies to see if I can  gain other other information.  I appreciate the opportunity to care for this patient. CC: Toni Hatchet, MD  Records review: 01/2014 colonoscopy - unable to pass sigmoid Subjective:   Chief Complaint: Previous diverticulitis, needs a colonoscopy  HPI The patient is a 58 year old divorced white woman who was admitted to the hospital at [facility] in the summer with diverticulitis she was treated with antibiotics and this resolved.  In the past in 2013 in 2015 she had attempted colonoscopy but it was incomplete and a CT or virtual colonoscopy was recommended but she declined to do that.  She is sent by the hospitalist to be considered for a colonoscopy by me.  She reports that in 2013 in 2015 at [facility] health in [place] colonoscopy was attempted and even switching to a pediatric colonoscope did not allow complete colonoscopy.  She reports that there was a narrow area in the colon and that her colon goes up and down her this way and that way she said and the cecum was not reached.  In 2000 she had an A&P repair and since that time she has had different bowel movements where if she has a full meal without eating beforehand she will have a urgent multiple stools that can be loose.  This may not have been so much of a problem then but is clearly a problem now.  She also has extreme urinary incontinence stress and nonstress and is about to see a urologist at [facility] tomorrow.  She denies any rectal bleeding she is not having abdominal pain  now.  She does take Xarelto for a history of paroxysmal atrial fibrillation.  GI review of systems is otherwise negative at this time. No Known Allergies Current Meds  Medication Sig  . ALPRAZolam (XANAX) 0.5 MG tablet Take 0.25 mg by mouth daily as needed for anxiety.  . flecainide (TAMBOCOR) 50 MG tablet Take 50 mg by mouth 2 (two) times daily.  . Melatonin 10 MG TABS Take 10 mg by mouth at bedtime as needed (for sleep).   . metoprolol tartrate (LOPRESSOR) 25  MG tablet Take 0.5 tablets (12.5 mg total) by mouth 2 (two) times daily. (Patient taking differently: Take 25 mg by mouth 2 (two) times daily. )  . XARELTO 20 MG TABS tablet Take 20 mg by mouth daily with supper.    Past Medical History:  Diagnosis Date  . A-fib ([place])   . Anxiety   . Arthritis   . Dysrhythmia    hx of afib, afib ablation in 01/2014, no afib since then  . Pneumonia    as a child  . Sigmoid diverticulitis    Past Surgical History:  Procedure Laterality Date  . ABDOMINAL HYSTERECTOMY    . BLADDER SURGERY  2000  . CARDIAC ELECTROPHYSIOLOGY MAPPING AND ABLATION     for afib  . CESAREAN SECTION    . CHOLECYSTECTOMY    . fallopian tube removal    . loop recorder explant  2015  . LOOP RECORDER IMPLANT  2015  . TONSILLECTOMY AND ADENOIDECTOMY    . 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   family history includes Bladder Cancer in her father; Colon polyps in her father; Diabetes in her mother; Heart disease in her father and mother; Hemochromatosis in her paternal grandfather; Kidney disease in her father.   Review of Systems See HPI.  Pedal edema otherwise as per HPI all other review systems negative.  Objective:   Physical Exam @BP  90/68   Pulse 60   Ht 5\' 6"  (1.676 m)   Wt 216 lb 9.6 oz (98.2 kg)   BMI 34.96 kg/m @  General:  Well-developed, well-nourished  and in no acute distress Eyes:  anicteric. ENT:   Mouth and posterior pharynx free of lesions.  Neck:   supple w/o thyromegaly or mass.  Lungs: Clear to auscultation bilaterally. Heart:  S1S2, no rubs, murmurs, gallops. Abdomen:  soft, non-tender, no hepatosplenomegaly, hernia, or mass and BS+.  Rectal: deferred Lymph:  no cervical or supraclavicular adenopathy. Extremities:   Left lower ext edema s/p TKR cyanosis or clubbing Skin   no rash. Neuro:  A&O x 3.  Psych:  appropriate mood and  Affect.   Data Reviewed: See HPI. In July she was not anemic on the 22nd, CT scan in July 1 sigmoid diverticulitis with some pelvic fluid, CT scan of the left  hip July 22 showing diverticulitis resolved but ASIS pubic symphysis fracture nondisplaced

## 2018-05-19 NOTE — Patient Instructions (Signed)
You have been scheduled for a colonoscopy. Please follow written instructions given to you at your visit today.  ?Please pick up your prep supplies at the pharmacy within the next 1-3 days. ?If you use inhalers (even only as needed), please bring them with you on the day of your procedure. ? ?You will be contaced by our office prior to your procedure for directions on holding your Xarelto.  If you do not hear from our office 1 week prior to your scheduled procedure, please call [phone] to discuss. ? ?I appreciate the opportunity to care for you. ?Carl Gessner, MD, FACG ?

## 2018-05-19 NOTE — Telephone Encounter (Signed)
   [name] 1960/12/09 [id number]  Dear Dr Einar Gip:  We have scheduled the above named patient for a(n) colonoscopy procedure. Our records show that (s)he is on anticoagulation therapy.  Please advise as to whether the patient may come off their therapy of xarelto  prior to their procedure which is scheduled for 07/21/18. Please let us know how many days as well.   Please route your response to Davelyn Gwinn [place], [place] or fax response to [phone].  Sincerely,    [facility]

## 2018-05-20 DIAGNOSIS — R351 Nocturia: Secondary | ICD-10-CM | POA: Diagnosis not present

## 2018-05-20 DIAGNOSIS — N3944 Nocturnal enuresis: Secondary | ICD-10-CM | POA: Diagnosis not present

## 2018-05-20 DIAGNOSIS — N3946 Mixed incontinence: Secondary | ICD-10-CM | POA: Diagnosis not present

## 2018-05-20 DIAGNOSIS — R350 Frequency of micturition: Secondary | ICD-10-CM | POA: Diagnosis not present

## 2018-05-22 NOTE — Telephone Encounter (Signed)
We received fax from Dr Einar Gip stating that patient can " hold xarelto for 2 days and to restart it the same day if no biopsy, otherwise 4-6 days. Low risk."

## 2018-05-22 NOTE — Telephone Encounter (Signed)
Patient informed and verbalized understanding about holding her xarelto. The fax we received will be sent to be scanned into epic.

## 2018-06-22 DIAGNOSIS — J019 Acute sinusitis, unspecified: Secondary | ICD-10-CM | POA: Diagnosis not present

## 2018-06-22 DIAGNOSIS — I480 Paroxysmal atrial fibrillation: Secondary | ICD-10-CM | POA: Diagnosis not present

## 2018-06-22 DIAGNOSIS — R05 Cough: Secondary | ICD-10-CM | POA: Diagnosis not present

## 2018-06-22 DIAGNOSIS — J441 Chronic obstructive pulmonary disease with (acute) exacerbation: Secondary | ICD-10-CM | POA: Diagnosis not present

## 2018-06-23 ENCOUNTER — Ambulatory Visit: Payer: BLUE CROSS/BLUE SHIELD

## 2018-06-30 ENCOUNTER — Ambulatory Visit (INDEPENDENT_AMBULATORY_CARE_PROVIDER_SITE_OTHER): Payer: BLUE CROSS/BLUE SHIELD | Admitting: Orthopaedic Surgery

## 2018-07-02 ENCOUNTER — Ambulatory Visit (HOSPITAL_COMMUNITY)
Admission: RE | Admit: 2018-07-02 | Discharge: 2018-07-02 | Disposition: A | Discharge status: Home / Self Care | Payer: BLUE CROSS/BLUE SHIELD | Source: Ambulatory Visit | Attending: Internal Medicine | Admitting: Internal Medicine

## 2018-07-02 ENCOUNTER — Ambulatory Visit: Payer: BLUE CROSS/BLUE SHIELD | Admitting: Vascular Surgery

## 2018-07-02 ENCOUNTER — Other Ambulatory Visit: Payer: Self-pay

## 2018-07-02 ENCOUNTER — Encounter: Payer: Self-pay | Admitting: Vascular Surgery

## 2018-07-02 VITALS — BP 116/82 | HR 58 | Temp 97.3°F | Resp 16 | Ht 66.5 in | Wt 220.0 lb

## 2018-07-02 DIAGNOSIS — R600 Localized edema: Secondary | ICD-10-CM | POA: Diagnosis not present

## 2018-07-02 DIAGNOSIS — M7989 Other specified soft tissue disorders: Secondary | ICD-10-CM

## 2018-07-02 NOTE — Progress Notes (Signed)
Referring Physician: Dr Erlinda Hong Patient name: [name] MRN: [id number] DOB: 01-31-1961 Sex: female  REASON FOR CONSULT: Left leg swelling  HPI: [name] is a 57 y.o. female, with a 1 year history of left leg swelling.  She states the leg became swollen after a total knee replacement 1 year ago.  She denies prior history of DVT.  She states she has also had some skin color changes in her calf with more of a brownish discoloration.  She is not really bothered by the swelling.  She does wear a compression garment that is pantyhose style that helps with her symptoms.  She states that she does not wear this in the summertime because it is too hot.  She does wear it the rest of the year.  She overall is very active and other than the appearance of the swelling is not really bothered by it.  She denies prior ulcerations.  She does have a history of atrial fibrillation and is on Xarelto for this.  She also does smoke.  She was counseled against this today for greater than 3 minutes.  She states that her legs do improve with leg elevation.  She does not really complain of symptoms in the right leg.  She has no bulging varicosities.  Other medical problems include degenerative arthritis which has been stable.  Past Medical History:  Diagnosis Date  . A-fib ([place])   . Anxiety   . Arthritis   . Dysrhythmia    hx of afib, afib ablation in 01/2014, no afib since then  . Pneumonia    as a child  . Sigmoid diverticulitis    Past Surgical History:  Procedure Laterality Date  . ABDOMINAL HYSTERECTOMY    . BLADDER SURGERY  2000  . CARDIAC ELECTROPHYSIOLOGY MAPPING AND ABLATION     for afib  . CESAREAN SECTION    . CHOLECYSTECTOMY    . fallopian tube removal    . loop recorder explant  2015  . LOOP RECORDER IMPLANT  2015  . TONSILLECTOMY AND ADENOIDECTOMY    . TOTAL KNEE ARTHROPLASTY Left 07/10/2017   Procedure: LEFT TOTAL KNEE ARTHROPLASTY;  Surgeon: Leandrew Koyanagi, MD;  Location: [facility];  Service:  Orthopedics;  Laterality: Left;  Marland Kitchen VAGINAL HYSTERECTOMY      Family History  Problem Relation Age of Onset  . Diabetes Mother   . Heart disease Mother   . Colon polyps Father   . Bladder Cancer Father   . Heart disease Father   . Kidney disease Father   . Hemochromatosis Paternal Grandfather     SOCIAL HISTORY: Social History   Socioeconomic History  . Marital status: Married    Spouse name: Not on file  . Number of children: 2  . Years of education: Not on file  . Highest education level: Not on file  Occupational History  . Occupation: Glass blower/designer  Social Needs  . Financial resource strain: Not on file  . Food insecurity:    Worry: Not on file    Inability: Not on file  . Transportation needs:    Medical: Not on file    Non-medical: Not on file  Tobacco Use  . Smoking status: Current Every Day Smoker    Packs/day: 0.50    Types: Cigarettes  . Smokeless tobacco: Never Used  . Tobacco comment: 4-5 cigarettes per day  Substance and Sexual Activity  . Alcohol use: No    Frequency: Never  . Drug use:  No  . Sexual activity: Yes    Birth control/protection: Surgical  Lifestyle  . Physical activity:    Days per week: Not on file    Minutes per session: Not on file  . Stress: Not on file  Relationships  . Social connections:    Talks on phone: Not on file    Gets together: Not on file    Attends religious service: Not on file    Active member of club or organization: Not on file    Attends meetings of clubs or organizations: Not on file    Relationship status: Not on file  . Intimate partner violence:    Fear of current or ex partner: Not on file    Emotionally abused: Not on file    Physically abused: Not on file    Forced sexual activity: Not on file  Other Topics Concern  . 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    No Known Allergies  Current Outpatient Medications  Medication Sig Dispense Refill  . ALPRAZolam (XANAX) 0.5 MG tablet Take 0.25 mg by mouth daily as needed for anxiety.    . flecainide (TAMBOCOR) 50 MG tablet Take 50 mg by mouth 2 (two) times daily.    . Melatonin 10 MG TABS Take 10 mg by mouth at bedtime as needed (for sleep).     . metoprolol tartrate (LOPRESSOR) 25 MG tablet Take 0.5 tablets (12.5 mg total) by mouth 2 (two) times daily. (Patient taking differently: Take 25 mg by mouth 2 (two) times daily. ) 30 tablet 0  . XARELTO 20 MG TABS tablet Take 20 mg by mouth daily with supper.   2   No current facility-administered medications for this visit.     ROS:   General:  No weight loss, Fever, chills  HEENT: No recent headaches, no nasal bleeding, no visual changes, no sore throat  Neurologic: No dizziness, blackouts, seizures. No recent symptoms of stroke or mini- stroke. No recent episodes of slurred speech, or temporary blindness.  Cardiac: No recent episodes of chest pain/pressure, no shortness of breath at rest.  No shortness of breath with exertion.  Denies history of atrial fibrillation or irregular heartbeat  Vascular: No history of rest pain in feet.  No history of claudication.  No history of non-healing ulcer, No history of DVT   Pulmonary: No home oxygen, no productive cough, no hemoptysis,  No asthma or wheezing  Musculoskeletal:  [X]  Arthritis, [ ]  Low back pain,  [X]  Joint pain  Hematologic:No history of hypercoagulable state.  No history of easy bleeding.  No history of anemia  Gastrointestinal: No hematochezia or melena,  No gastroesophageal reflux, no trouble swallowing  Urinary: [ ]   chronic Kidney disease, [ ]  on HD - [ ]  MWF or [ ]  TTHS, [ ]  Burning with urination, [ ]  Frequent urination, [ ]  Difficulty urinating;   Skin: No rashes  Psychological: No history of anxiety,  No history of depression   Physical Examination  Vitals:   07/02/18  1027  BP: 116/82  Pulse: (!) 58  Resp: 16  Temp: (!) 97.3 F (36.3 C)  TempSrc: Oral  SpO2: 94%  Weight: 220 lb (99.8 kg)  Height: 5' 6.5" (1.689 m)    Body mass index is 34.98 kg/m.  General:  Alert and oriented, no acute distress HEENT: Normal Neck: No bruit or JVD Pulmonary: Clear to auscultation bilaterally Cardiac: Regular Rate and Rhythm without murmur Abdomen: Soft, non-tender, non-distended, no mass Skin: No rash, mild brawny discoloration circumferentially left calf area Extremity Pulses:  2+ radial, brachial, femoral, dorsalis pedis, posterior tibial pulses bilaterally Musculoskeletal: No deformity trace left leg edema approximately 5% larger than the right leg from the knee down no real involvement of the foot  Neurologic: Upper and lower extremity motor 5/5 and symmetric  DATA:  Patient had a venous reflux exam of the left leg today.  This showed deep vein reflux in the common femoral vein.  She had reflux in the greater saphenous vein at the saphenofemoral junction and at the knee.  Vein diameter was 5 to 6 mm.  ASSESSMENT: Chronic left leg swelling.  Most likely this is multifactorial.  She does have a component of reflux in her superficial and deep veins in the left leg.  However, she does have swelling in the left leg which can be seen after her knee replacement as well and may have a component of lymphedema.  She is not really bothered by her symptoms but mainly wanted reassurance that this is not going to be a problem for her down the road.  PLAN: Discussed with patient the possibility of laser ablation of her left greater saphenous vein to improve her leg swelling symptoms.  However, I did discuss  with her that I did not believe that this would completely be curative as she may still have some swelling afterwards in light of her left knee replacement.  At this point she wishes to defer a laser ablation and will prefer compression therapy alone.  She will follow-up with [place] on an as-needed basis.   Ruta Hinds, MD [facility] Office: [phone] Pager: [phone]

## 2018-07-10 ENCOUNTER — Ambulatory Visit (INDEPENDENT_AMBULATORY_CARE_PROVIDER_SITE_OTHER): Payer: BLUE CROSS/BLUE SHIELD | Admitting: Orthopaedic Surgery

## 2018-07-10 ENCOUNTER — Ambulatory Visit (INDEPENDENT_AMBULATORY_CARE_PROVIDER_SITE_OTHER): Payer: Self-pay

## 2018-07-10 ENCOUNTER — Encounter (INDEPENDENT_AMBULATORY_CARE_PROVIDER_SITE_OTHER): Payer: Self-pay | Admitting: Orthopaedic Surgery

## 2018-07-10 VITALS — Ht 66.0 in | Wt 210.0 lb

## 2018-07-10 DIAGNOSIS — Z96652 Presence of left artificial knee joint: Secondary | ICD-10-CM | POA: Diagnosis not present

## 2018-07-10 MED ORDER — TRAMADOL HCL 50 MG PO TABS: 50.0000 mg | ORAL_TABLET | Freq: Every evening | ORAL | 2 refills | Status: DC | PRN

## 2018-07-10 NOTE — Progress Notes (Signed)
Office Visit Note   Patient: [name]           Date of Birth: 1961-04-29           MRN: [id number] Visit Date: 07/10/2018              Requested by: Toni Noon, MD [place], [place] [address] PCP: Toni Hatchet, MD   Assessment & Plan: Visit Diagnoses:  1. S/P TKR (total knee replacement), left     Plan: 1 year TKA follow up plan  Patient is now one year out from a left uncemented total knee arthroplasty. Patient is doing very well and very pleased with the results. Radiographs reveal a total knee arthroplasty in good position, with no evidence of subsidence, loosening, or complicating features. It was reinforced that prophylactic antibiotics should be taken with any procedure including but not limited to dental work or colonoscopies. We plan to follow them at 1 year intervals at this time with 2 view x-rays of the left knee, and as always we should be notified with any questions or concerns in the interim.  Follow-Up Instructions: Return in about 1 year (around 07/11/2019).   Orders:  Orders Placed This Encounter  Procedures  . XR Knee 1-2 Views Left   Meds ordered this encounter  Medications  . traMADol (ULTRAM) 50 MG tablet    Sig: Take 1 tablet (50 mg total) by mouth at bedtime as needed.    Dispense:  30 tablet    Refill:  2      Procedures: No procedures performed   Clinical Data: No additional findings.   Subjective: Chief Complaint  Patient presents with  . Left Knee - Follow-up    07/10/17 Left TKA    [name] is 1 year status post left uncemented total knee replacement.  Overall she is doing well.  She did see [name] of vascular surgery to discuss her persistent left lower extremity swelling.  He felt that this was multifactorial and she agreed to treated with compression socks.  Overall the left knee is doing well and she has made excellent progress and happy with her decision to have the surgery.  She reports  occasional pain at night for which she takes tramadol.   Review of Systems   Objective: Vital Signs: Ht 5\' 6"  (1.676 m)   Wt 210 lb (95.3 kg)   BMI 33.89 kg/m   Physical Exam  Ortho Exam Left knee exam shows a fully healed surgical scar.  Full painless range of motion.  Collaterals are stable. Specialty Comments:  No specialty comments available.  Imaging: Xr Knee 1-2 Views Left  Result Date: 07/10/2018 Stable total knee replacement in good alignment.     PMFS History: Patient Active Problem List   Diagnosis Date Noted  . Acute diverticulitis 02/23/2018  . A-fib ([place]) 02/23/2018  . Anxiety 02/23/2018  . Heme positive stool 02/23/2018  . Urinary incontinence 02/23/2018  . S/P TKR (total knee replacement), left 07/10/2017  . Unilateral primary osteoarthritis, left knee 06/23/2017  . Chondromalacia of left patella 01/16/2017  . TOBACCO ABUSE 10/25/2008  . PALPITATIONS 10/25/2008  . DYSPNEA 10/25/2008   Past Medical History:  Diagnosis Date  . A-fib ([place])   . Anxiety   . Arthritis   . Dysrhythmia    hx of afib, afib ablation in 01/2014, no afib since then  . Pneumonia    as a child  . Sigmoid diverticulitis     Family  History  Problem Relation Age of Onset  . Diabetes Mother   . Heart disease Mother   . Colon polyps Father   . Bladder Cancer Father   . Heart disease Father   . Kidney disease Father   . Hemochromatosis Paternal Grandfather     Past Surgical History:  Procedure Laterality Date  . ABDOMINAL HYSTERECTOMY    . BLADDER SURGERY  2000  . CARDIAC ELECTROPHYSIOLOGY MAPPING AND ABLATION     for afib  . CESAREAN SECTION    . CHOLECYSTECTOMY    . fallopian tube removal    . loop recorder explant  2015  . LOOP RECORDER IMPLANT  2015  . TONSILLECTOMY AND ADENOIDECTOMY    . TOTAL KNEE ARTHROPLASTY Left 07/10/2017   Procedure: LEFT TOTAL KNEE ARTHROPLASTY;  Surgeon: Toni Koyanagi, MD;  Location: [facility];  Service: Orthopedics;  Laterality: Left;    Marland Kitchen VAGINAL HYSTERECTOMY     Social History   Occupational History  . Occupation: Glass blower/designer  Tobacco Use  . Smoking status: Current Every Day Smoker    Packs/day: 0.50    Types: Cigarettes  . Smokeless tobacco: Never Used  . Tobacco comment: 4-5 cigarettes per day  Substance and Sexual Activity  . Alcohol use: No    Frequency: Never  . Drug use: No  . Sexual activity: Yes    Birth control/protection: Surgical

## 2018-07-20 ENCOUNTER — Telehealth: Payer: Self-pay | Admitting: Internal Medicine

## 2018-07-20 NOTE — Telephone Encounter (Signed)
Colon appt cancelled with [facility]. Pt aware and states she can wait until after the first of the year to reschedule.

## 2018-07-20 NOTE — Telephone Encounter (Signed)
Patient is scheduled for procedure at the hospital but is not feeling well, wants to speak to nurse and check if he'll still do procedure if not she wants to reschedule

## 2018-07-20 NOTE — Telephone Encounter (Signed)
The colonoscopy is elective and I think should be cancelled and we will not charge her a late fee   I probably cannot do it until next year but I will look and see if there is a spot somewhere if she wants to do this year - please send me a note back about this and also tell [facility]

## 2018-07-20 NOTE — Telephone Encounter (Signed)
Pt states she was seen at urgent care yesterday and has bronchitis and a sinus infection. Had a temp of 101 yesterday. Still feels bad and sounds bad. Pt is scheduled for colon at [facility] tomorrow but wants to know if Dr. Carlean Purl will do procedure. Does not want to prep if he may not do procedure. Please advise.

## 2018-07-20 NOTE — Telephone Encounter (Signed)
[facility] endo also calling to be advised whether to cancel or give pt instructions for procedure.

## 2018-07-20 NOTE — Progress Notes (Signed)
RN called patient to complete pre-op screening. Patient made RN aware that she went to an urgent care yesterday and was diagnosed with bronchitis and started on oral antibiotics. Patient wanting to know if surgeon will still do colonoscopy tomorrow and that she had left the office a voicemail this am with no response. RN called and spoke with front desk personnel at [facility], she states that she will send message out to surgeon.

## 2018-07-21 ENCOUNTER — Encounter (HOSPITAL_COMMUNITY): Admission: RE | Payer: Self-pay | Source: Ambulatory Visit

## 2018-07-21 ENCOUNTER — Ambulatory Visit (HOSPITAL_COMMUNITY)
Admission: RE | Admit: 2018-07-21 | Payer: BLUE CROSS/BLUE SHIELD | Source: Ambulatory Visit | Admitting: Internal Medicine

## 2018-07-21 SURGERY — COLONOSCOPY WITH PROPOFOL
Anesthesia: Monitor Anesthesia Care

## 2018-07-29 ENCOUNTER — Ambulatory Visit
Admission: RE | Admit: 2018-07-29 | Discharge: 2018-07-29 | Disposition: A | Discharge status: Home / Self Care | Payer: BLUE CROSS/BLUE SHIELD | Source: Ambulatory Visit | Attending: Internal Medicine | Admitting: Internal Medicine

## 2018-07-29 ENCOUNTER — Ambulatory Visit: Payer: BLUE CROSS/BLUE SHIELD

## 2018-07-29 DIAGNOSIS — Z1231 Encounter for screening mammogram for malignant neoplasm of breast: Secondary | ICD-10-CM

## 2018-07-29 NOTE — Telephone Encounter (Signed)
I notified the patient that I will call her when the February schedule comes out to reschedule colonoscopy

## 2018-08-04 DIAGNOSIS — N3946 Mixed incontinence: Secondary | ICD-10-CM | POA: Diagnosis not present

## 2018-08-21 DIAGNOSIS — R350 Frequency of micturition: Secondary | ICD-10-CM | POA: Diagnosis not present

## 2018-08-21 DIAGNOSIS — N3946 Mixed incontinence: Secondary | ICD-10-CM | POA: Diagnosis not present

## 2018-08-27 ENCOUNTER — Telehealth: Payer: Self-pay

## 2018-08-27 NOTE — Telephone Encounter (Signed)
-----   Message from Marlon Pel, RN sent at 07/29/2018  4:52 PM EST ----- The colonoscopy is elective and I think should be cancelled and we will not charge her a late fee   I probably cannot do it until next year but I will look and see if there is a spot somewhere if she wants to do this year - please send me a note back about this and also tell [facility]  See phone note from 07/29/18

## 2018-08-27 NOTE — Telephone Encounter (Signed)
Patient contacted and offered an appt for colonoscopy for 10/06/18.  She declines at this time.  Her schedule will not accommodate.  She understands to call back in the end of January for Feb

## 2018-09-07 DIAGNOSIS — L820 Inflamed seborrheic keratosis: Secondary | ICD-10-CM | POA: Diagnosis not present

## 2018-09-07 DIAGNOSIS — E782 Mixed hyperlipidemia: Secondary | ICD-10-CM | POA: Diagnosis not present

## 2018-10-13 ENCOUNTER — Telehealth: Payer: Self-pay

## 2018-10-15 ENCOUNTER — Other Ambulatory Visit: Payer: Self-pay

## 2018-10-15 DIAGNOSIS — I4891 Unspecified atrial fibrillation: Secondary | ICD-10-CM

## 2018-10-15 MED ORDER — XARELTO 20 MG PO TABS: 20.0000 mg | ORAL_TABLET | Freq: Every day | ORAL | 2 refills | Status: DC

## 2018-10-20 ENCOUNTER — Telehealth: Payer: Self-pay

## 2018-10-20 NOTE — Telephone Encounter (Signed)
10/20/18 patient called to r/s 10/21/18 fu with Dr Einar Gip due to work conflict. Patient r/s to March 11th at 2:45. Original appointment has been moved.

## 2018-10-21 ENCOUNTER — Ambulatory Visit: Payer: Self-pay | Admitting: Cardiology

## 2018-10-30 ENCOUNTER — Other Ambulatory Visit: Payer: Self-pay

## 2018-10-30 DIAGNOSIS — I4891 Unspecified atrial fibrillation: Secondary | ICD-10-CM

## 2018-10-30 MED ORDER — FLECAINIDE ACETATE 50 MG PO TABS: 50.0000 mg | ORAL_TABLET | Freq: Two times a day (BID) | ORAL | 1 refills | Status: DC

## 2018-10-30 MED ORDER — XARELTO 20 MG PO TABS: 20.0000 mg | ORAL_TABLET | Freq: Every day | ORAL | 2 refills | Status: DC

## 2018-11-02 NOTE — Progress Notes (Signed)
Subjective:  Primary Physician:  Velna Hatchet, MD  Patient ID: [name], female    DOB: 1961-01-06, 58 y.o.   MRN: [id number]  Chief Complaint  Patient presents with  . Atrial Fibrillation  . Follow-up    76mo   HPI: [name] is a 58y.o. female  with paroxysmal A. Fib diagnosed in 2015 and tried several medications that she did not help. She then underwent successful ablation; however, in Jan 2019 noted to have recurrent A fib with RVR on event monitor and is now on flecainide.  Patient presents for 6 month follow up. Over the past 3-4 months, she has had fairly frequent episodes of atrial fibrillation with RVR with feeling of chest tightness, fullness in her throat and severe dizziness.  No syncope. Tolerating anticoagulation well. Unfortunately has started to smoke again.  Has gained about 10-15 pounds in weight since last office visit.  Complains of fatigue and not feeling well overall.  Past Medical History:  Diagnosis Date  . [place]   . Anxiety   . Arthritis   . Dysrhythmia    hx of afib, afib ablation in 01/2014, no afib since then  . Pneumonia    as a child  . Sigmoid diverticulitis     Past Surgical History:  Procedure Laterality Date  . ABDOMINAL HYSTERECTOMY    . BLADDER SURGERY  2000  . CARDIAC ELECTROPHYSIOLOGY MAPPING AND ABLATION     for afib  . CESAREAN SECTION    . CHOLECYSTECTOMY    . fallopian tube removal    . loop recorder explant  2015  . LOOP RECORDER IMPLANT  2015  . REDUCTION MAMMAPLASTY Bilateral   . TONSILLECTOMY AND ADENOIDECTOMY    . TOTAL KNEE ARTHROPLASTY Left 07/10/2017   Procedure: LEFT TOTAL KNEE ARTHROPLASTY;  Surgeon: XLeandrew Koyanagi MD;  [facility]  Service: Orthopedics;  Laterality: Left;  .Marland KitchenVAGINAL HYSTERECTOMY      Social History   Socioeconomic History  . Marital status: Significant Other    Spouse name: Not on file  . Number of children: 2  . Years of education: Not on file  . Highest education  level: Not on file  Occupational History  . Occupation: mGlass blower/designer Social Needs  . Financial resource strain: Not on file  . Food insecurity:    Worry: Not on file    Inability: Not on file  . Transportation needs:    Medical: Not on file    Non-medical: Not on file  Tobacco Use  . Smoking status: Current Every Day Smoker    Packs/day: 0.25    Types: Cigarettes  . Smokeless tobacco: Never Used  . Tobacco comment: 4-5 cigarettes per day  Substance and Sexual Activity  . Alcohol use: No    Frequency: Never  . Drug use: No  . Sexual activity: Yes    Birth control/protection: Surgical  Lifestyle  . Physical activity:    Days per week: Not on file    Minutes per session: Not on file  . Stress: Not on file  Relationships  . Social connections:    Talks on phone: Not on file    Gets together: Not on file    Attends religious service: Not on file    Active member of club or organization: Not on file    Attends meetings of clubs or organizations: Not on file    Relationship status: Not on file  . Intimate partner  violence:    Fear of current or ex partner: Not on file    Emotionally abused: Not on file    Physically abused: Not on file    Forced sexual activity: Not on file  Other Topics Concern  . 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    Current Outpatient Medications on File Prior to Visit  Medication Sig Dispense Refill  . Melatonin 5 MG TABS Take 5 mg by mouth at bedtime.    . metoprolol tartrate (LOPRESSOR) 25 MG tablet Take 0.5 tablets (12.5 mg total) by  mouth 2 (two) times daily. (Patient taking differently: Take 25 mg by mouth 2 (two) times daily. ) 30 tablet 0  . XARELTO 20 MG TABS tablet Take 1 tablet (20 mg total) by mouth daily with supper. 30 tablet 2  . ALPRAZolam (XANAX) 0.5 MG tablet Take 0.25 mg by mouth daily as needed for anxiety.     No current facility-administered medications on file prior to visit.      Review of Systems  Constitutional: Positive for malaise/fatigue.  Respiratory: Positive for shortness of breath. Negative for cough and hemoptysis.   Cardiovascular: Positive for palpitations. Negative for chest pain, claudication and leg swelling.  Gastrointestinal: Negative for abdominal pain, blood in stool, constipation, heartburn and vomiting.  Genitourinary: Negative for dysuria.  Musculoskeletal: Negative for joint pain and myalgias.  Neurological: Negative for dizziness, focal weakness and headaches.  Endo/Heme/Allergies: Does not bruise/bleed easily.  Psychiatric/Behavioral: Negative for depression. The patient is not nervous/anxious.   All other systems reviewed and are negative.      Objective:  Blood pressure 110/70, pulse 77, height 5' 6.5" (1.689 m), weight 226 lb (102.5 kg), SpO2 98 %. Body mass index is 35.93 kg/m.  Physical Exam  Constitutional: She appears well-developed. No distress.  Moderately obese   HENT:  Head: Atraumatic.  Eyes: Conjunctivae are normal.  Neck: Neck supple. No JVD present. No thyromegaly present.  Cardiovascular: Normal rate, regular rhythm, normal heart sounds and intact distal pulses. Exam reveals no gallop.  No murmur heard. Pulmonary/Chest: Effort normal and breath sounds normal.  Abdominal: Soft. Bowel sounds are normal.  Obese  Musculoskeletal: Normal range of motion.        General: No edema.  Neurological: She is alert.  Skin: Skin is warm and dry.  Psychiatric: She has a normal mood and affect.    CARDIAC STUDIES:   VAS [place] LOWER EXTREMITY VENOUS REFLUX  07/02/2018: Left: Abnormal reflux times were noted in the common femoral vein, great saphenous vein at the saphenofemoral junction, and great saphenous vein at the knee. There is no evidence of deep vein thrombosis in the lower extremity.There is no evidence of  superficial venous thrombosis.  Echocardiogram 09/02/2017: Left ventricle cavity is normal in size. Moderate concentric hypertrophy of the left ventricle. Low normal LV systolic function. Normal diastolic filling pattern, normal LAP. Left ventricle regional wall motion findings: No wall motion abnormalities. Visual EF is 50-55%. Calculated EF 48%.  Mild tricuspid regurgitation. No evidence of  pulmonary hypertension. Patient in sinus rhythm during exam.   Event Monitor 30 days 08/28/2017: Paroxysmal sustained A. Fib with RVR - Symptomatic with palpitations. Latest episode was on 09/02/2017.  A. Fib Ablation 2015 at [facility]  Nuclear stress test [2015]: Normal.  Recent Labs:   03/16/2018: CBC normal. Potassium 3.6, Creatinine 0.67, calcium 8.2, albumin 2.7, CMP otherwise normal.  07/11/2017: Potassium 3.8, creatinine 0.62, calcium 8.4, EGFR greater than 60, BMP normal. CBC normal.  Assessment & Recommendations:   Paroxysmal atrial fibrillation ([place]) - Plan: EKG 12-Lead, PCV CARDIAC STRESS TEST, CBC, Lipid Panel With LDL/HDL Ratio, TSH, CMP14+EGFR  Tobacco use disorder  Moderate obesity  EKG 11/04/2018: Normal sinus rhythm at rate of 69 bpm, normal axis.  No evidence of ischemia, normal EKG. No significant change from  EKG 04/13/2018  Recommendation:   Patient is here on 6 month office visit for paroxysmal atrial fibrillation. She's been and frequent episodes of breakthrough atrial fibrillation, hence I'll increase the dose of flecainide to 100 mg p.o. b.i.d.  I'd like to perform a treadmill stress test to evaluate for proarrhythmia in about 10 days to 2 weeks.  She has not had recent labs, will obtain CMP, CBC, lipid  profile testing and TSH.  She has gained significant amount of weight, she is now moderately obese.  Appropriate to consider bariatric surgery in view of significant risk factor and comorbidity.  Smoking cessation was again discussed with the patient.  I would like to see her back in one month.  Adrian Prows, MD, [facility] 11/04/2018, 8:30 PM [facility]. [place] Pager: [phone] Office: [phone] If no answer Cell [phone]

## 2018-11-04 ENCOUNTER — Ambulatory Visit: Payer: BLUE CROSS/BLUE SHIELD | Admitting: Cardiology

## 2018-11-04 ENCOUNTER — Encounter: Payer: Self-pay | Admitting: Cardiology

## 2018-11-04 ENCOUNTER — Other Ambulatory Visit: Payer: Self-pay

## 2018-11-04 VITALS — BP 110/70 | HR 77 | Ht 66.5 in | Wt 226.0 lb

## 2018-11-04 DIAGNOSIS — E668 Other obesity: Secondary | ICD-10-CM

## 2018-11-04 DIAGNOSIS — I480 Paroxysmal atrial fibrillation: Secondary | ICD-10-CM

## 2018-11-04 DIAGNOSIS — F17200 Nicotine dependence, unspecified, uncomplicated: Secondary | ICD-10-CM

## 2018-11-04 MED ORDER — FLECAINIDE ACETATE 100 MG PO TABS: 100.0000 mg | ORAL_TABLET | Freq: Two times a day (BID) | ORAL | 3 refills | Status: DC

## 2018-11-06 ENCOUNTER — Other Ambulatory Visit: Payer: Self-pay | Admitting: Cardiology

## 2018-11-08 ENCOUNTER — Other Ambulatory Visit: Payer: Self-pay | Admitting: Cardiology

## 2018-11-09 NOTE — Telephone Encounter (Signed)
Ok to fill 

## 2018-11-09 NOTE — Telephone Encounter (Signed)
yes

## 2018-11-20 DIAGNOSIS — I480 Paroxysmal atrial fibrillation: Secondary | ICD-10-CM | POA: Diagnosis not present

## 2018-11-21 LAB — LIPID PANEL WITH LDL/HDL RATIO
CHOLESTEROL TOTAL: 167 mg/dL (ref 100–199)
HDL: 43 mg/dL (ref >39)
LDL Calculated: 108 mg/dL — ABNORMAL HIGH (ref 0–99)
LDl/HDL Ratio: 2.5 ratio (ref 0.0–3.2)
TRIGLYCERIDES: 81 mg/dL (ref 0–149)
VLDL Cholesterol Cal: 16 mg/dL (ref 5–40)

## 2018-11-21 LAB — CMP14+EGFR
ALBUMIN: 4 g/dL (ref 3.8–4.9)
ALT: 16 IU/L (ref 0–32)
AST: 19 IU/L (ref 0–40)
Albumin/Globulin Ratio: 1.5 (ref 1.2–2.2)
Alkaline Phosphatase: 139 IU/L — ABNORMAL HIGH (ref 39–117)
BUN / CREAT RATIO: 13 (ref 9–23)
BUN: 10 mg/dL (ref 6–24)
Bilirubin Total: 0.3 mg/dL (ref 0.0–1.2)
CALCIUM: 8.9 mg/dL (ref 8.7–10.2)
CO2: 24 mmol/L (ref 20–29)
Chloride: 106 mmol/L (ref 96–106)
Creatinine, Ser: 0.79 mg/dL (ref 0.57–1.00)
GFR, EST AFRICAN AMERICAN: 95 mL/min/{1.73_m2} (ref >59)
GFR, EST NON AFRICAN AMERICAN: 82 mL/min/{1.73_m2} (ref >59)
GLOBULIN, TOTAL: 2.6 g/dL (ref 1.5–4.5)
Glucose: 82 mg/dL (ref 65–99)
POTASSIUM: 4.1 mmol/L (ref 3.5–5.2)
Sodium: 143 mmol/L (ref 134–144)
TOTAL PROTEIN: 6.6 g/dL (ref 6.0–8.5)

## 2018-11-21 LAB — TSH: TSH: 2 u[IU]/mL (ref 0.450–4.500)

## 2018-11-21 LAB — CBC
Hematocrit: 43.3 % (ref 34.0–46.6)
Hemoglobin: 14.9 g/dL (ref 11.1–15.9)
MCH: 31.6 pg (ref 26.6–33.0)
MCHC: 34.4 g/dL (ref 31.5–35.7)
MCV: 92 fL (ref 79–97)
PLATELETS: 228 10*3/uL (ref 150–450)
RBC: 4.71 x10E6/uL (ref 3.77–5.28)
RDW: 12.1 % (ref 11.7–15.4)
WBC: 5.2 10*3/uL (ref 3.4–10.8)

## 2018-12-01 NOTE — Progress Notes (Deleted)
Subjective:  Primary Physician:  Velna Hatchet, MD  Patient ID: [name], female    DOB: 09-Jun-1961, 58 y.o.   MRN: [id number]  No chief complaint on file.   HPI: [name]  is a 59 y.o. female  with paroxysmal A. Fib diagnosed in 2015 and tried several medications that she did not help. She then underwent successful ablation; however, in Jan 2019 noted to have recurrent A fib with RVR on event monitor and is now on flecainide.  Patient presents for 6 month follow up. Over the past 3-4 months, she has had fairly frequent episodes of atrial fibrillation with RVR with feeling of chest tightness, fullness in her throat and severe dizziness.  No syncope. Tolerating anticoagulation well. Unfortunately has started to smoke again.  Has gained about 10-15 pounds in weight since last office visit.  Complains of fatigue and not feeling well overall.  Past Medical History:  Diagnosis Date  . A-fib ([place])   . Anxiety   . Arthritis   . Dysrhythmia    hx of afib, afib ablation in 01/2014, no afib since then  . Pneumonia    as a child  . Sigmoid diverticulitis     Past Surgical History:  Procedure Laterality Date  . ABDOMINAL HYSTERECTOMY    . BLADDER SURGERY  2000  . CARDIAC ELECTROPHYSIOLOGY MAPPING AND ABLATION     for afib  . CESAREAN SECTION    . CHOLECYSTECTOMY    . fallopian tube removal    . loop recorder explant  2015  . LOOP RECORDER IMPLANT  2015  . REDUCTION MAMMAPLASTY Bilateral   . TONSILLECTOMY AND ADENOIDECTOMY    . TOTAL KNEE ARTHROPLASTY Left 07/10/2017   Procedure: LEFT TOTAL KNEE ARTHROPLASTY;  Surgeon: Leandrew Koyanagi, MD;  Location: [facility];  Service: Orthopedics;  Laterality: Left;  Marland Kitchen VAGINAL HYSTERECTOMY      Social History   Socioeconomic History  . Marital status: Significant Other    Spouse name: Not on file  . Number of children: 2  . Years of education: Not on file  . Highest education level: Not on file  Occupational History  . Occupation:  Glass blower/designer  Social Needs  . Financial resource strain: Not on file  . Food insecurity:    Worry: Not on file    Inability: Not on file  . Transportation needs:    Medical: Not on file    Non-medical: Not on file  Tobacco Use  . Smoking status: Current Every Day Smoker    Packs/day: 0.25    Types: Cigarettes  . Smokeless tobacco: Never Used  . Tobacco comment: 4-5 cigarettes per day  Substance and Sexual Activity  . Alcohol use: No    Frequency: Never  . Drug use: No  . Sexual activity: Yes    Birth control/protection: Surgical  Lifestyle  . Physical activity:    Days per week: Not on file    Minutes per session: Not on file  . Stress: Not on file  Relationships  . Social connections:    Talks on phone: Not on file    Gets together: Not on file    Attends religious service: Not on file    Active member of club or organization: Not on file    Attends meetings of clubs or organizations: Not on file    Relationship status: Not on file  . Intimate partner violence:    Fear of current or ex partner: Not on file  Emotionally abused: Not on file    Physically abused: Not on file    Forced sexual activity: Not on file  Other Topics Concern  . 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    Current Outpatient Medications on File Prior to Visit  Medication Sig Dispense Refill  . ALPRAZolam (XANAX) 0.5 MG tablet Take 0.25 mg by mouth daily as needed for anxiety.    . flecainide (TAMBOCOR) 100 MG tablet TAKE 1/2 TABLET (50 MG) BY MOUTH TWICE A DAY 30 tablet 2  . Melatonin 5  MG TABS Take 5 mg by mouth at bedtime.    . metoprolol tartrate (LOPRESSOR) 25 MG tablet TAKE 1 TABLET BY MOUTH TWICE A DAY 180 tablet 5  . XARELTO 20 MG TABS tablet Take 1 tablet (20 mg total) by mouth daily with supper. 30 tablet 2   No current facility-administered medications on file prior to visit.     ***Review of Systems  Constitution: Positive for malaise/fatigue. Negative for chills, decreased appetite and weight gain.  Cardiovascular: Positive for dyspnea on exertion. Negative for leg swelling and syncope.  Endocrine: Negative for cold intolerance.  Hematologic/Lymphatic: Does not bruise/bleed easily.  Musculoskeletal: Negative for joint swelling.  Gastrointestinal: Negative for abdominal pain, anorexia and change in bowel habit.  Neurological: Negative for headaches and light-headedness.  Psychiatric/Behavioral: Negative for depression and substance abuse.  All other systems reviewed and are negative.      Objective:  There were no vitals taken for this visit. There is no height or weight on file to calculate BMI.  ***Physical Exam  Constitutional: No distress.  Moderately built and moderately obese  HENT:  Head: Atraumatic.  Eyes: Conjunctivae are normal.  Neck: Neck supple. No JVD present. No thyromegaly present.  Cardiovascular: Normal rate, regular rhythm, normal heart sounds and intact distal pulses. Exam reveals no gallop.  No murmur heard. Pulmonary/Chest: Effort normal and breath sounds normal.  Abdominal: Soft. Bowel sounds are normal.  Musculoskeletal: Normal range of motion.        General: No edema.  Neurological: She is alert.  Skin: Skin is warm and dry.  Psychiatric: She has a normal mood and affect.    Radiology: No results found.  Laboratory Examination: *** CMP Latest Ref Rng & Units 11/20/2018 02/27/2018 02/25/2018  Glucose 65 - 99 mg/dL 82 104(H) 83  BUN 6 - 24 mg/dL 10 <5(L) <5(L)  Creatinine 0.57 - 1.00 mg/dL 0.79 0.67 0.67  Sodium 134 -  144 mmol/L 143 143 139  Potassium 3.5 - 5.2 mmol/L 4.1 3.6 3.6  Chloride 96 - 106 mmol/L 106 112(H) 107  CO2 20 - 29 mmol/L 24 26 25   Calcium 8.7 - 10.2 mg/dL 8.9 8.2(L) 8.3(L)  Total Protein 6.0 - 8.5 g/dL 6.6 6.0(L) -  Total Bilirubin 0.0 - 1.2 mg/dL 0.3 0.3 -  Alkaline Phos 39 - 117 IU/L 139(H) 123 -  AST 0 - 40 IU/L 19 21 -  ALT 0 - 32 IU/L 16 22 -   CBC Latest Ref  Rng & Units 11/20/2018 03/16/2018 02/27/2018  WBC 3.4 - 10.8 x10E3/uL 5.2 9.5 4.9  Hemoglobin 11.1 - 15.9 g/dL 14.9 14.9 12.0  Hematocrit 34.0 - 46.6 % 43.3 45.7 36.9  Platelets 150 - 450 x10E3/uL 228 242 199   Lipid Panel     Component Value Date/Time   CHOL 167 11/20/2018 0736   TRIG 81 11/20/2018 0736   HDL 43 11/20/2018 0736   LDLCALC 108 (H) 11/20/2018 0736   HEMOGLOBIN A1C No results found for: HGBA1C, MPG TSH Recent Labs    11/20/18 0736  TSH 2.000    CARDIAC STUDIES:   CARDIAC STUDIES:   VAS [place] LOWER EXTREMITY VENOUS REFLUX 07/02/2018: Left: Abnormal reflux times were noted in the common femoral vein, great saphenous vein at the saphenofemoral junction, and great saphenous vein at the knee. There is no evidence of deep vein thrombosis in the lower extremity.There is no evidence of  superficial venous thrombosis.  Echocardiogram 09/02/2017: Left ventricle cavity is normal in size. Moderate concentric hypertrophy of the left ventricle. Low normal LV systolic function. Normal diastolic filling pattern, normal LAP. Left ventricle regional wall motion findings: No wall motion abnormalities. Visual EF is 50-55%. Calculated EF 48%.  Mild tricuspid regurgitation. No evidence of pulmonary hypertension. Patient in sinus rhythm during exam.   Event Monitor 30 days 08/28/2017: Paroxysmal sustained A. Fib with RVR - Symptomatic with palpitations. Latest episode was on 09/02/2017.  A. Fib Ablation 2015 at [facility]  Nuclear stress test  [2015]: Normal.  Assessment:    No diagnosis found.  EKG  11/04/2018: Normal sinus rhythm at rate of 69 bpm, normal axis.  No evidence of ischemia, normal EKG. No significant change from  EKG 04/13/2018  Recommendation:  Patient is here on 1 month office visit for paroxysmal atrial fibrillation. She's been and frequent episodes of breakthrough atrial fibrillation, hence  the dose of flecainide increased to 100 mg p.o. b.i.d.  Unable to perform treadmill stress test to evaluate for proarrhythmia due to COVID 19.  I will check Flecainide levels.  Recent labs reviewed. Labs are stable except for very min elevation in LDL and can be treated by lifestyle change. (CMP, CBC, lipid profile testing and TSH).  She has gained significant amount of weight, she is now moderately obese.  Appropriate to consider bariatric surgery in view of significant risk factor and comorbidity.  Smoking cessation was again discussed with the patient.  I would like to see her back in 6 months.   Adrian Prows, MD, [facility] 12/01/2018, 8:14 PM [facility]. [place] Pager: [phone] Office: [phone] If no answer Cell [phone]

## 2018-12-02 ENCOUNTER — Ambulatory Visit: Payer: BLUE CROSS/BLUE SHIELD | Admitting: Cardiology

## 2018-12-02 DIAGNOSIS — J029 Acute pharyngitis, unspecified: Secondary | ICD-10-CM | POA: Diagnosis not present

## 2018-12-02 DIAGNOSIS — R918 Other nonspecific abnormal finding of lung field: Secondary | ICD-10-CM | POA: Diagnosis not present

## 2018-12-02 DIAGNOSIS — J069 Acute upper respiratory infection, unspecified: Secondary | ICD-10-CM | POA: Diagnosis not present

## 2018-12-02 DIAGNOSIS — R05 Cough: Secondary | ICD-10-CM | POA: Diagnosis not present

## 2018-12-02 NOTE — Progress Notes (Deleted)
Virtual Visit via Video Note   Subjective:   [name], female    DOB: 29-Jan-1961, 58 y.o.   MRN: [id number]   I connected with the patient on 12/02/18 by a video enabled telemedicine application and verified that I am speaking with the correct person using two identifiers.     I discussed the limitations of evaluation and management by telemedicine and the availability of in person appointments. The patient expressed understanding and agreed to proceed.   This visit type was conducted due to national recommendations for restrictions regarding the COVID-19 Pandemic (e.g. social distancing).  This format is felt to be most appropriate for this patient at this time.  All issues noted in this document were discussed and addressed.  No physical exam was performed (except for noted visual exam findings with Tele health visits).  The patient has consented to conduct a Tele health visit and understands insurance will be billed.     Chief complaint:  ***  *** HPI  58 y.o. *** female with ***  *** Past Medical History:  Diagnosis Date  . A-fib ([place])   . Anxiety   . Arthritis   . Dysrhythmia    hx of afib, afib ablation in 01/2014, no afib since then  . Pneumonia    as a child  . Sigmoid diverticulitis     *** Past Surgical History:  Procedure Laterality Date  . ABDOMINAL HYSTERECTOMY    . BLADDER SURGERY  2000  . CARDIAC ELECTROPHYSIOLOGY MAPPING AND ABLATION     for afib  . CESAREAN SECTION    . CHOLECYSTECTOMY    . fallopian tube removal    . loop recorder explant  2015  . LOOP RECORDER IMPLANT  2015  . REDUCTION MAMMAPLASTY Bilateral   . TONSILLECTOMY AND ADENOIDECTOMY    . TOTAL KNEE ARTHROPLASTY Left 07/10/2017   Procedure: LEFT TOTAL KNEE ARTHROPLASTY;  Surgeon: Leandrew Koyanagi, MD;  Location: [facility];  Service: Orthopedics;  Laterality: Left;  Marland Kitchen VAGINAL HYSTERECTOMY      *** Social History   Socioeconomic History  . Marital status: Significant Other   Spouse name: Not on file  . Number of children: 2  . Years of education: Not on file  . Highest education level: Not on file  Occupational History  . Occupation: Glass blower/designer  Social Needs  . Financial resource strain: Not on file  . Food insecurity:    Worry: Not on file    Inability: Not on file  . Transportation needs:    Medical: Not on file    Non-medical: Not on file  Tobacco Use  . Smoking status: Current Every Day Smoker    Packs/day: 0.25    Types: Cigarettes  . Smokeless tobacco: Never Used  . Tobacco comment: 4-5 cigarettes per day  Substance and Sexual Activity  . Alcohol use: No    Frequency: Never  . Drug use: No  . Sexual activity: Yes    Birth control/protection: Surgical  Lifestyle  . Physical activity:    Days per week: Not on file    Minutes per session: Not on file  . Stress: Not on file  Relationships  . Social connections:    Talks on phone: Not on file    Gets together: Not on file    Attends religious service: Not on file    Active member of club or organization: Not on file    Attends meetings of clubs or organizations: Not on file  Relationship status: Not on file  . Intimate partner violence:    Fear of current or ex partner: Not on file    Emotionally abused: Not on file    Physically abused: Not on file    Forced sexual activity: Not on file  Other Topics Concern  . 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    *** Current Outpatient Medications on File Prior to Visit  Medication Sig Dispense Refill  . ALPRAZolam (XANAX) 0.5 MG  tablet Take 0.25 mg by mouth daily as needed for anxiety.    . flecainide (TAMBOCOR) 100 MG tablet TAKE 1/2 TABLET (50 MG) BY MOUTH TWICE A DAY 30 tablet 2  . Melatonin 5 MG TABS Take 5 mg by mouth at bedtime.    . metoprolol tartrate (LOPRESSOR) 25 MG tablet TAKE 1 TABLET BY MOUTH TWICE A DAY 180 tablet 5  . XARELTO 20 MG TABS tablet Take 1 tablet (20 mg total) by mouth daily with supper. 30 tablet 2   No current facility-administered medications on file prior to visit.     Cardiovascular studies:  ***  *** Recent labs: ***  ROS      *** There were no vitals filed for this visit. (Measured by the patient using a home BP monitor)   Observation/findings during video visit   Objective:   Physical Exam        Assessment & Recommendations:   ***  ***   Vivyan Biggers Esther Hardy, MD [facility] [place] Pager: [phone] Office: [phone] If no answer Cell [phone]

## 2018-12-03 ENCOUNTER — Other Ambulatory Visit: Payer: Self-pay | Admitting: Internal Medicine

## 2018-12-03 ENCOUNTER — Ambulatory Visit: Payer: BLUE CROSS/BLUE SHIELD | Admitting: Cardiology

## 2018-12-03 ENCOUNTER — Other Ambulatory Visit: Payer: Self-pay

## 2018-12-03 ENCOUNTER — Encounter: Payer: Self-pay | Admitting: Cardiology

## 2018-12-03 VITALS — BP 101/70 | HR 81 | Ht 66.5 in | Wt 226.0 lb

## 2018-12-03 DIAGNOSIS — I480 Paroxysmal atrial fibrillation: Secondary | ICD-10-CM | POA: Diagnosis not present

## 2018-12-03 DIAGNOSIS — E668 Other obesity: Secondary | ICD-10-CM | POA: Diagnosis not present

## 2018-12-03 DIAGNOSIS — R0609 Other forms of dyspnea: Secondary | ICD-10-CM

## 2018-12-03 DIAGNOSIS — F17200 Nicotine dependence, unspecified, uncomplicated: Secondary | ICD-10-CM

## 2018-12-03 DIAGNOSIS — R0600 Dyspnea, unspecified: Secondary | ICD-10-CM

## 2018-12-03 DIAGNOSIS — R911 Solitary pulmonary nodule: Secondary | ICD-10-CM

## 2018-12-03 DIAGNOSIS — E785 Hyperlipidemia, unspecified: Secondary | ICD-10-CM

## 2018-12-03 NOTE — Progress Notes (Signed)
Virtual Visit via Video Note: This visit type was conducted due to national recommendations for restrictions regarding the COVID-19 Pandemic (e.g. social distancing).  This format is felt to be most appropriate for this patient at this time.  All issues noted in this document were discussed and addressed.  No physical exam was performed (except for noted visual exam findings with Telehealth visits).  The patient has consented to conduct a Telehealth visit and understands insurance will be billed.   I connected with@, on 12/03/18 at  by a video enabled telemedicine application and verified that I am speaking with the correct person using two identifiers.   I discussed the limitations of evaluation and management by telemedicine and the availability of in person appointments. The patient expressed understanding and agreed to proceed.   I have discussed with patient regarding the safety during COVID Pandemic and steps and precautions to be taken including social distancing, frequent hand wash and use of detergent soap, gels with the patient. I asked the patient to avoid touching mouth, nose, eyes, ears with her hands. I encouraged regular walking around the neighborhood and exercise and regular diet, as long as social distancing can be maintained.  Subjective:  Primary Physician:  Velna Hatchet, MD  Patient ID: [name], female    DOB: [birth year], 58 y.o.   MRN: [id number]  Chief Complaint  Patient presents with  . Atrial Fibrillation    HPI: [name]  is a 58 y.o. female  with paroxysmal A. Fib diagnosed in 2015 and tried several medications that she did not help. She then underwent successful ablation; however, in Jan 2019 noted to have recurrent A fib with RVR on event monitor and is now on flecainide.  I had seen her on 11/04/2018 and due to recurrent episodes of A. fib, increase the dose of recommended 1 mg p.o. twice daily.  States that since then she has not had any recurrence  of atrial fibrillation, feels well and has not even had one palpitation episode even brief.  States that she feels well.  She was diagnosed with upper respiratory infection yesterday and started on antibiotics.  A chest x-ray was obtained and there is suspicion of some lung mass, CT scan of the chest has been ordered.  Patient has been concerned about this and has been anxious.  Has cough and chronic dyspnea, no fever, no chills.  No leg edema.  Past Medical History:  Diagnosis Date  . A-fib ([place])   . Anxiety   . Arthritis   . Dysrhythmia    hx of afib, afib ablation in 01/2014, no afib since then  . Pneumonia    as a child  . Sigmoid diverticulitis     Past Surgical History:  Procedure Laterality Date  . ABDOMINAL HYSTERECTOMY    . BLADDER SURGERY  2000  . CARDIAC ELECTROPHYSIOLOGY MAPPING AND ABLATION     for afib  . CESAREAN SECTION    . CHOLECYSTECTOMY    . fallopian tube removal    . loop recorder explant  2015  . LOOP RECORDER IMPLANT  2015  . REDUCTION MAMMAPLASTY Bilateral   . TONSILLECTOMY AND ADENOIDECTOMY    . TOTAL KNEE ARTHROPLASTY Left 07/10/2017   Procedure: LEFT TOTAL KNEE ARTHROPLASTY;  Surgeon: Leandrew Koyanagi, MD;  Location: [facility];  Service: Orthopedics;  Laterality: Left;  Marland Kitchen VAGINAL HYSTERECTOMY      Social History   Socioeconomic History  . Marital status: Divorced    Spouse name: Not  on file  . Number of children: 2  . Years of education: Not on file  . Highest education level: Not on file  Occupational History  . Occupation: Glass blower/designer  Social Needs  . Financial resource strain: Not on file  . Food insecurity:    Worry: Not on file    Inability: Not on file  . Transportation needs:    Medical: Not on file    Non-medical: Not on file  Tobacco Use  . Smoking status: Current Every Day Smoker    Packs/day: 0.25    Types: Cigarettes  . Smokeless tobacco: Never Used  . Tobacco comment: 4-5 cigarettes per day  Substance and Sexual Activity   . Alcohol use: No    Frequency: Never  . Drug use: No  . Sexual activity: Yes    Birth control/protection: Surgical  Lifestyle  . Physical activity:    Days per week: Not on file    Minutes per session: Not on file  . Stress: Not on file  Relationships  . Social connections:    Talks on phone: Not on file    Gets together: Not on file    Attends religious service: Not on file    Active member of club or organization: Not on file    Attends meetings of clubs or organizations: Not on file    Relationship status: Not on file  . Intimate partner violence:    Fear of current or ex partner: Not on file    Emotionally abused: Not on file    Physically abused: Not on file    Forced sexual activity: Not on file  Other Topics Concern  . 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    Current Outpatient Medications on File Prior to Visit  Medication Sig Dispense Refill  . benzonatate (TESSALON) 200 MG capsule Take 200 mg by mouth 3 (three) times daily as needed.    . doxycycline (DORYX) 100 MG EC tablet Take 100 mg by mouth 2 (two) times daily.    . flecainide (TAMBOCOR) 100 MG tablet Take 100 mg by mouth 2 (two) times daily.    . Melatonin 5 MG TABS Take 5 mg by mouth at bedtime.    . metoprolol tartrate (LOPRESSOR) 25 MG tablet TAKE 1 TABLET BY MOUTH TWICE A DAY 180 tablet 5  . tolterodine (DETROL LA) 4 MG 24 hr capsule Take 1 capsule by mouth daily.    Alveda Reasons 20 MG TABS tablet Take 1 tablet (20 mg total) by mouth daily with supper. 30 tablet 2   No current facility-administered  medications on file prior to visit.     Review of Systems  Constitution: Positive for malaise/fatigue. Negative for chills, decreased appetite and weight gain.  Cardiovascular: Positive for dyspnea on exertion. Negative for chest pain, leg swelling and syncope.  Respiratory: Positive for cough and shortness of breath (chronic).   Endocrine: Negative for cold intolerance.  Hematologic/Lymphatic: Does not bruise/bleed easily.  Musculoskeletal: Negative for joint swelling.  Gastrointestinal: Negative  for abdominal pain, anorexia and change in bowel habit.  Neurological: Negative for headaches and light-headedness.  Psychiatric/Behavioral: Negative for depression and substance abuse. The patient is nervous/anxious.   All other systems reviewed and are negative.     Objective:  Blood pressure 101/70, pulse 81, height 5' 6.5" (1.689 m), weight 226 lb (102.5 kg), SpO2 96 %. Body mass index is 35.93 kg/m. Her physical exam was not performed today. Prior visit exam is as below. Patient in no acute distress.   Physical Exam  Constitutional: No distress.  Moderately built and moderately obese  HENT:  Head: Atraumatic.  Eyes: Conjunctivae are normal.  Neck: Neck supple. No JVD present. No thyromegaly present.  Cardiovascular: Normal rate, regular rhythm, normal heart sounds and intact distal pulses. Exam reveals no gallop.  No murmur heard. Pulmonary/Chest: Effort normal and breath sounds normal.  Abdominal: Soft. Bowel sounds are normal.  Musculoskeletal: Normal range of motion.        General: No edema.  Neurological: She is alert.  Skin: Skin is warm and dry.  Psychiatric: She has a normal mood and affect.   Radiology: No results found.  Laboratory Examination:  CMP Latest Ref Rng & Units 11/20/2018 02/27/2018 02/25/2018  Glucose 65 - 99 mg/dL 82 104(H) 83  BUN 6 - 24 mg/dL 10 <5(L) <5(L)  Creatinine 0.57 - 1.00 mg/dL 0.79 0.67 0.67  Sodium 134 - 144 mmol/L 143 143 139  Potassium  3.5 - 5.2 mmol/L 4.1 3.6 3.6  Chloride 96 - 106 mmol/L 106 112(H) 107  CO2 20 - 29 mmol/L 24 26 25   Calcium 8.7 - 10.2 mg/dL 8.9 8.2(L) 8.3(L)  Total Protein 6.0 - 8.5 g/dL 6.6 6.0(L) -  Total Bilirubin 0.0 - 1.2 mg/dL 0.3 0.3 -  Alkaline Phos 39 - 117 IU/L 139(H) 123 -  AST 0 - 40 IU/L 19 21 -  ALT 0 - 32 IU/L 16 22 -   CBC Latest Ref Rng & Units 11/20/2018 03/16/2018 02/27/2018  WBC 3.4 - 10.8 x10E3/uL 5.2 9.5 4.9  Hemoglobin 11.1 - 15.9 g/dL 14.9 14.9 12.0  Hematocrit 34.0 - 46.6 % 43.3 45.7 36.9  Platelets 150 - 450 x10E3/uL 228 242 199   Lipid Panel     Component Value Date/Time   CHOL 167 11/20/2018 0736   TRIG 81 11/20/2018 0736   HDL 43 11/20/2018 0736   LDLCALC 108 (H) 11/20/2018 0736   HEMOGLOBIN A1C No results found for: HGBA1C, MPG TSH Recent Labs    11/20/18 0736  TSH 2.000    CARDIAC STUDIES:   CARDIAC STUDIES:   VAS [place] LOWER EXTREMITY VENOUS REFLUX 07/02/2018: Left: Abnormal reflux times were noted in the common femoral vein, great saphenous vein at the saphenofemoral junction, and great saphenous vein at the knee. There is no evidence of deep vein thrombosis in the lower extremity.There is no evidence of  superficial venous thrombosis.  Echocardiogram 09/02/2017: Left ventricle cavity is normal in size. Moderate concentric hypertrophy of the left ventricle. Low normal LV systolic function. Normal diastolic filling pattern, normal LAP. Left ventricle regional wall motion findings: No wall motion abnormalities. Visual EF is 50-55%. Calculated EF 48%.  Mild tricuspid regurgitation. No evidence of pulmonary hypertension. Patient in sinus rhythm during exam.   Event Monitor 30 days 08/28/2017: Paroxysmal sustained A. Fib with RVR - Symptomatic with palpitations. Latest episode was on 09/02/2017.  A. Fib Ablation 2015 at [facility]  Nuclear stress test  [2015]: Normal.  Assessment:  Paroxysmal atrial fibrillation (HCC)  Tobacco use disorder   Moderate obesity  Dyspnea on exertion  Mild hyperlipidemia  EKG 11/04/2018: Normal sinus rhythm at rate of 69 bpm, normal axis.  No evidence of ischemia, normal EKG. No significant change from  EKG 04/13/2018  Recommendation:  Patient is here on 1 month office visit for paroxysmal atrial fibrillation. She's been and frequent episodes of breakthrough atrial fibrillation, hence  the dose of flecainide increased to 100 mg p.o. b.i.d.  Unable to perform treadmill stress test to evaluate for proarrhythmia due to COVID 19.  I will set this up for 12/25/2018 sometime around that week.  I like to see her back in 3 months for follow-up with repeat EKG.  Since being on flecainide, she is feeling the best she has with regard to palpitations, has not had any recurrence of A. fib.  She is now being worked up for lung mass and a CT scan has been ordered.  I reviewed her labs, she has very mild hyperlipidemia, however I suspect she probably will have significant coronary calcification in view of tobacco use disorder, will consider using a low-dose of a statin but will wait for the CT scan.  She has quit smoking 5 days ago as of 11/28/2018, given her positive reinforcement for the same and advised her to use nicotine patches that will help with withdrawal.  She thinks it is a good idea.  Adrian Prows, MD, [facility] 12/03/2018, 11:31 AM [facility] [place] Pager: [phone] Office: [phone] If no answer Cell [phone]

## 2018-12-10 ENCOUNTER — Telehealth: Payer: Self-pay

## 2018-12-10 ENCOUNTER — Other Ambulatory Visit: Payer: Self-pay | Admitting: Cardiology

## 2018-12-10 DIAGNOSIS — I4891 Unspecified atrial fibrillation: Secondary | ICD-10-CM

## 2018-12-10 MED ORDER — XARELTO 20 MG PO TABS: 20.0000 mg | ORAL_TABLET | Freq: Every day | ORAL | 6 refills | Status: DC

## 2018-12-11 ENCOUNTER — Ambulatory Visit: Payer: BLUE CROSS/BLUE SHIELD | Admitting: Cardiology

## 2018-12-17 ENCOUNTER — Ambulatory Visit
Admission: RE | Admit: 2018-12-17 | Discharge: 2018-12-17 | Disposition: A | Discharge status: Home / Self Care | Payer: BLUE CROSS/BLUE SHIELD | Source: Ambulatory Visit | Attending: Internal Medicine | Admitting: Internal Medicine

## 2018-12-17 ENCOUNTER — Other Ambulatory Visit: Payer: Self-pay

## 2018-12-17 DIAGNOSIS — R911 Solitary pulmonary nodule: Secondary | ICD-10-CM

## 2018-12-18 ENCOUNTER — Other Ambulatory Visit: Payer: Self-pay | Admitting: Cardiology

## 2019-01-04 ENCOUNTER — Other Ambulatory Visit: Payer: Self-pay

## 2019-01-04 ENCOUNTER — Ambulatory Visit (INDEPENDENT_AMBULATORY_CARE_PROVIDER_SITE_OTHER): Payer: BLUE CROSS/BLUE SHIELD

## 2019-01-04 DIAGNOSIS — I480 Paroxysmal atrial fibrillation: Secondary | ICD-10-CM

## 2019-03-10 IMAGING — MR MR KNEE*L* W/O CM
5 of 6 series · 33 of 40 positions shown · non-contrast
Comparison: None.

CLINICAL DATA: Left knee pain for 2 months centered about the
patella. No known injury.

EXAM:
MRI OF THE LEFT KNEE WITHOUT CONTRAST
TECHNIQUE: Multiplanar, multisequence MR imaging of the knee was performed. No
intravenous contrast was administered.

[Series 6: PD fat-sat · axial · left · 3.0mm · 0.39mm/px · z∈[-22,+93]mm · 8 of 36 slices shown (1 of 3)]
[im 1/36]
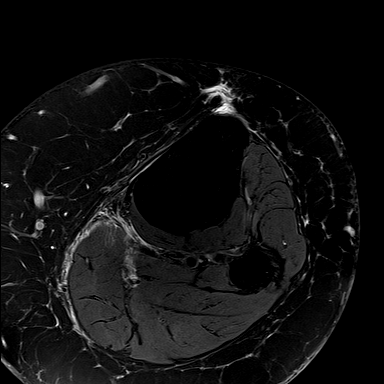
[im 6/36]
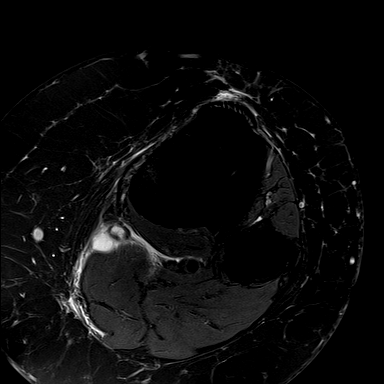
[im 11/36]
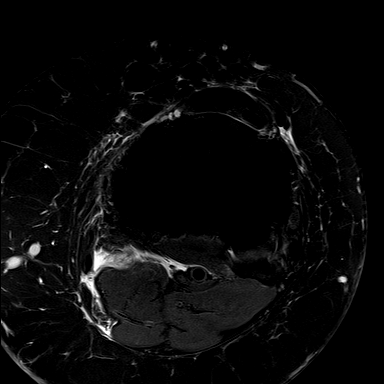
[im 16/36]
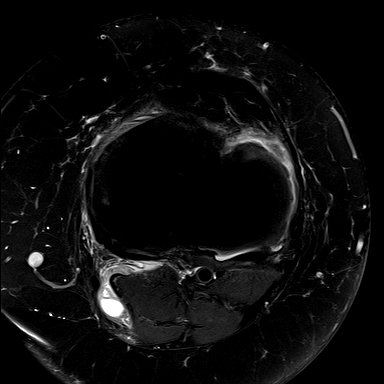
[im 21/36]
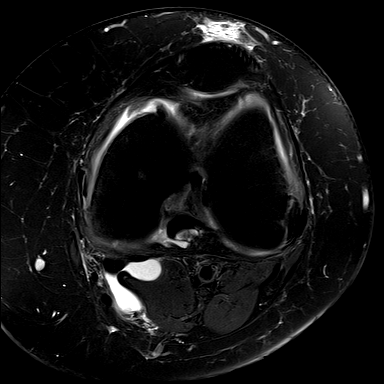
[im 26/36]
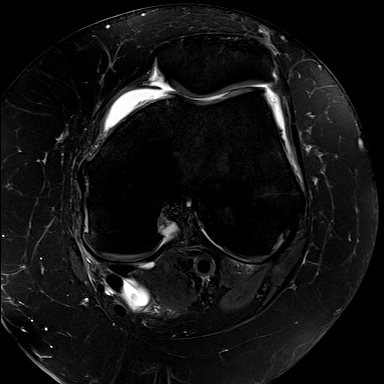
[im 31/36]
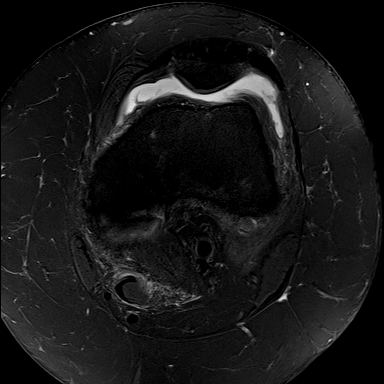
[im 36/36]
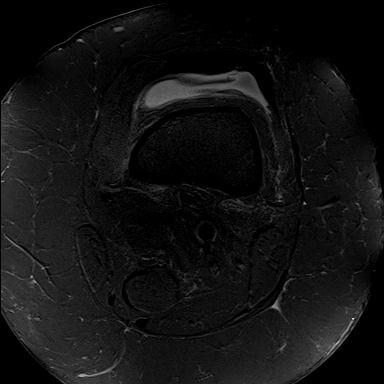

[Series 8: PD fat-sat · coronal · left · 3.0mm · 0.33mm/px · 7 of 33 slices shown (2 of 3)]
[im 1/33]
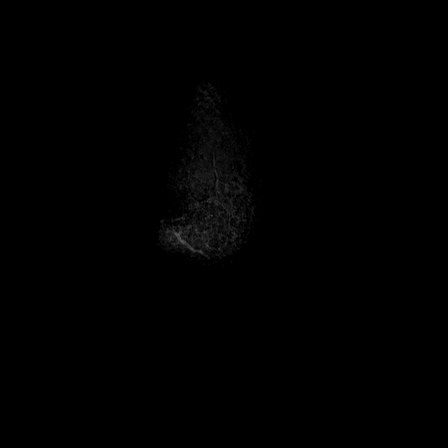
[im 6/33]
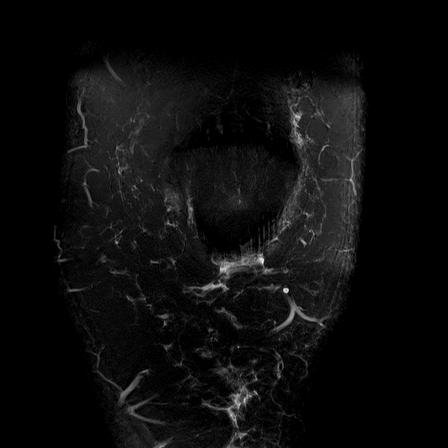
[im 11/33]
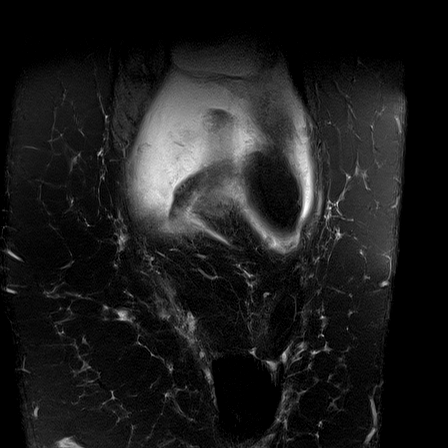
[im 17/33]
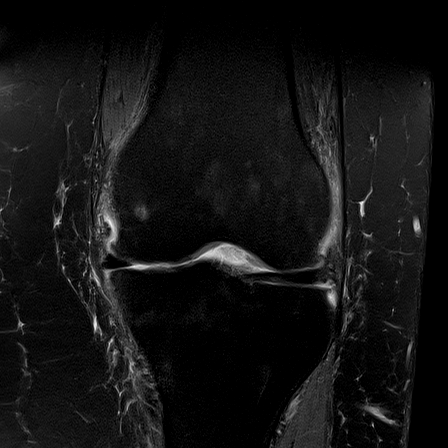
[im 22/33]
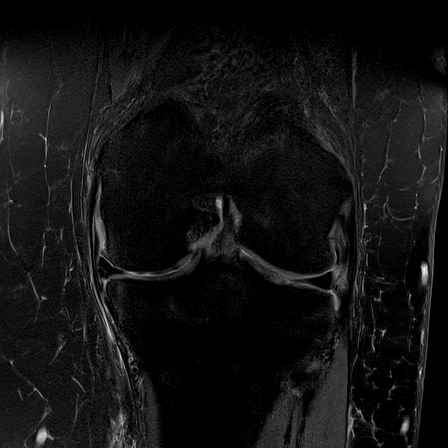
[im 27/33]
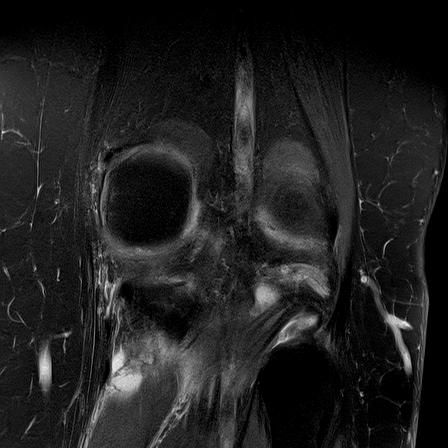
[im 33/33]
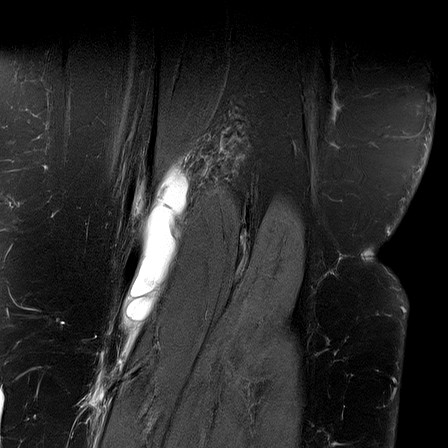

[Series 9: T2 fat-sat · coronal · left · 3.0mm · 0.39mm/px · 7 of 33 slices shown]
[im 1/33]
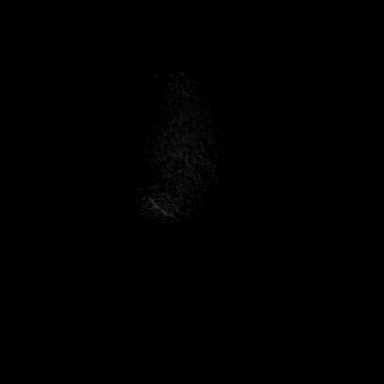
[im 6/33]
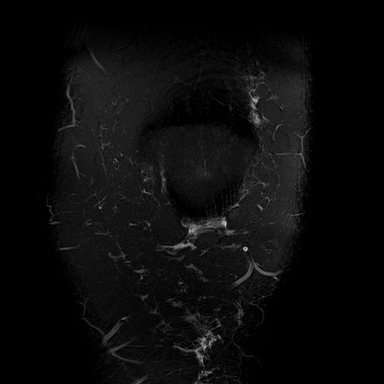
[im 11/33]
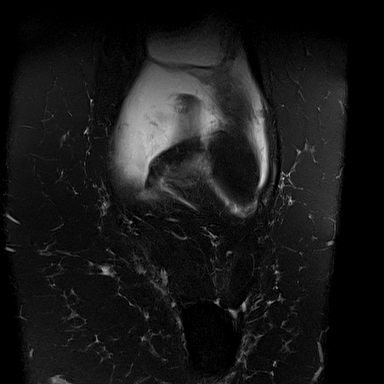
[im 17/33]
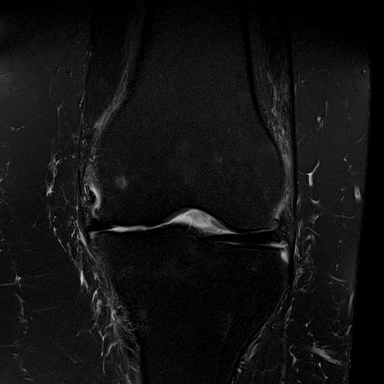
[im 22/33]
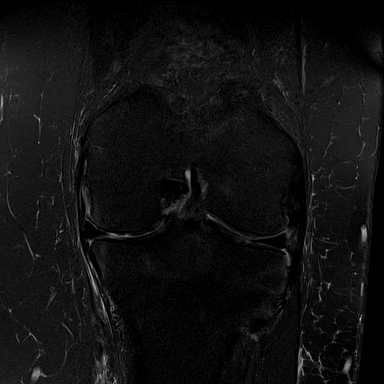
[im 27/33]
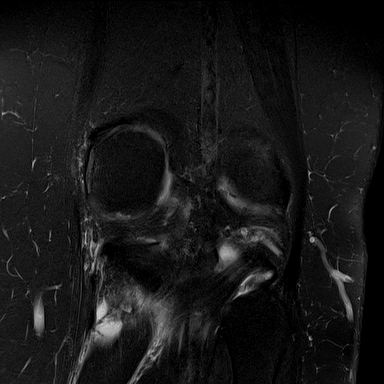
[im 33/33]
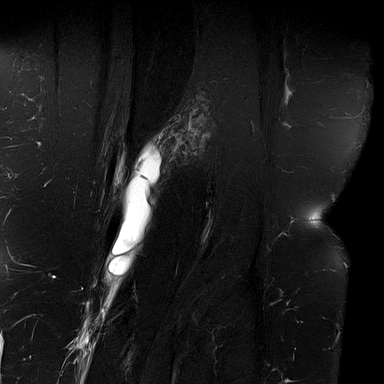

[Series 10: PD fat-sat · sagittal · left · 3.0mm · 0.39mm/px · 6 of 27 slices shown (3 of 3)]
[im 1/27]
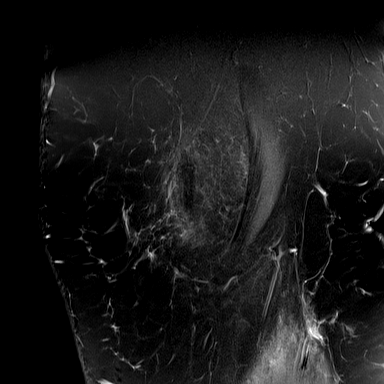
[im 6/27]
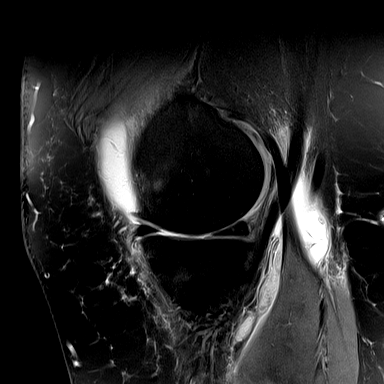
[im 11/27]
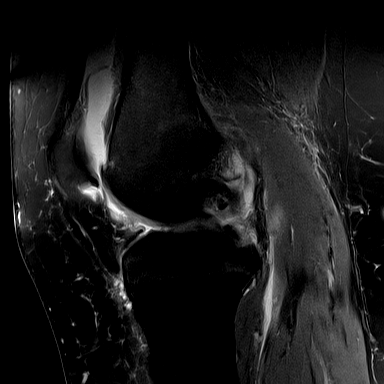
[im 16/27]
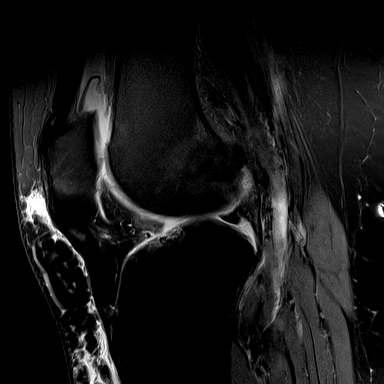
[im 21/27]
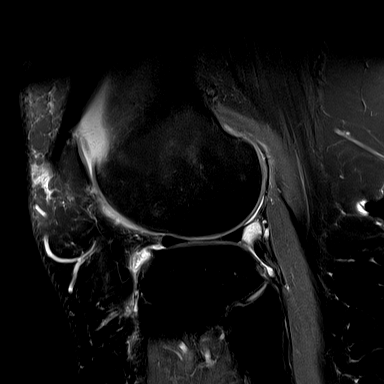
[im 27/27]
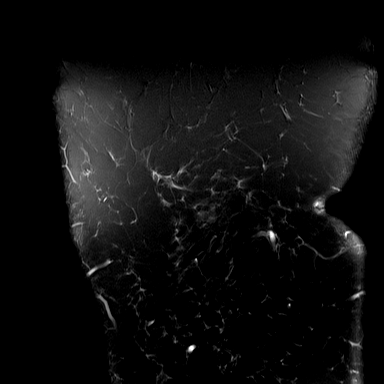

[Series 11: PD · oblique · left · 1.5mm · 0.44mm/px · 5 of 21 slices shown]
[im 1/21]
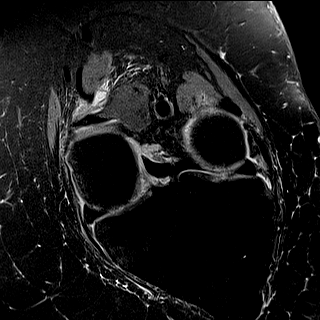
[im 6/21]
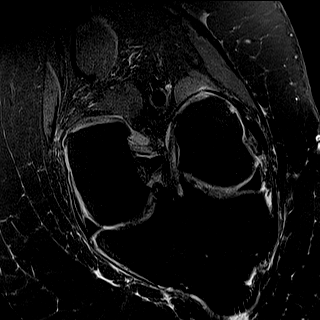
[im 11/21]
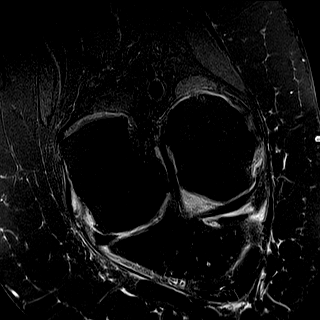
[im 16/21]
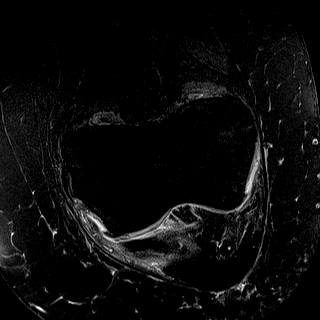
[im 21/21]
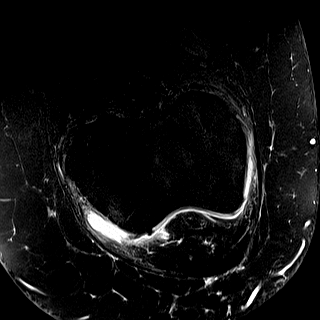

[33 of 40 positions shown; findings below may reference images not displayed]

FINDINGS: MENISCI

Medial meniscus:  Intact.

Lateral meniscus:  Intact.

LIGAMENTS

Cruciates:  Intact.

Collaterals:  Intact.

CARTILAGE

Patellofemoral: Scattered small fissures in cartilage in the patella
are identified most notable in the midpole. No full-thickness
defect.

Medial:  Thinned throughout with associated joint space narrowing.

Lateral:  Mildly degenerated.

Joint:  Small to moderate effusion.

Popliteal Fossa: Baker's cyst measures approximately 1.1 cm
transverse by 2.2 cm AP by 9.2 cm craniocaudal. Fluid dissects out
of the cyst along the medial gastrocnemius.

Extensor Mechanism:  Intact.

Bones: No fracture or worrisome lesion. Small osteophytes are
present about the knee.

Other: None.
IMPRESSION: Negative for meniscal or ligament tear.

Osteoarthritis about the knee appearing worst in the medial
compartment where it is moderate to moderately severe and appears
advanced for age.

Large Baker's cyst with fluid dissecting along the medial
gastrocnemius.

## 2019-05-07 DIAGNOSIS — M859 Disorder of bone density and structure, unspecified: Secondary | ICD-10-CM | POA: Diagnosis not present

## 2019-05-07 DIAGNOSIS — Z0000 Encounter for general adult medical examination without abnormal findings: Secondary | ICD-10-CM | POA: Diagnosis not present

## 2019-05-14 DIAGNOSIS — F330 Major depressive disorder, recurrent, mild: Secondary | ICD-10-CM | POA: Diagnosis not present

## 2019-05-14 DIAGNOSIS — Z0000 Encounter for general adult medical examination without abnormal findings: Secondary | ICD-10-CM | POA: Diagnosis not present

## 2019-05-14 DIAGNOSIS — Z1331 Encounter for screening for depression: Secondary | ICD-10-CM | POA: Diagnosis not present

## 2019-05-14 DIAGNOSIS — M8580 Other specified disorders of bone density and structure, unspecified site: Secondary | ICD-10-CM | POA: Diagnosis not present

## 2019-05-14 DIAGNOSIS — R918 Other nonspecific abnormal finding of lung field: Secondary | ICD-10-CM | POA: Diagnosis not present

## 2019-05-14 DIAGNOSIS — N329 Bladder disorder, unspecified: Secondary | ICD-10-CM | POA: Diagnosis not present

## 2019-05-20 DIAGNOSIS — R82998 Other abnormal findings in urine: Secondary | ICD-10-CM | POA: Diagnosis not present

## 2019-06-01 NOTE — Telephone Encounter (Signed)
Error

## 2019-06-06 ENCOUNTER — Other Ambulatory Visit: Payer: Self-pay | Admitting: Cardiology

## 2019-06-06 DIAGNOSIS — I4891 Unspecified atrial fibrillation: Secondary | ICD-10-CM

## 2019-06-14 ENCOUNTER — Encounter: Payer: Self-pay | Admitting: Nurse Practitioner

## 2019-06-14 LAB — COLOGUARD

## 2019-07-28 ENCOUNTER — Telehealth: Payer: Self-pay | Admitting: Internal Medicine

## 2019-07-28 NOTE — Telephone Encounter (Signed)
Patient will come in and see Dr. Carlean Purl on 09/03/19 11:10 to discuss colonoscopy.  Patient on Alen Blew

## 2019-08-02 DIAGNOSIS — X32XXXA Exposure to sunlight, initial encounter: Secondary | ICD-10-CM | POA: Diagnosis not present

## 2019-08-02 DIAGNOSIS — L570 Actinic keratosis: Secondary | ICD-10-CM | POA: Diagnosis not present

## 2019-08-02 DIAGNOSIS — E782 Mixed hyperlipidemia: Secondary | ICD-10-CM | POA: Diagnosis not present

## 2019-08-27 DIAGNOSIS — S2239XA Fracture of one rib, unspecified side, initial encounter for closed fracture: Secondary | ICD-10-CM

## 2019-08-27 HISTORY — DX: Fracture of one rib, unspecified side, initial encounter for closed fracture: S22.39XA

## 2019-09-03 ENCOUNTER — Ambulatory Visit: Payer: BLUE CROSS/BLUE SHIELD | Admitting: Internal Medicine

## 2019-10-01 ENCOUNTER — Ambulatory Visit: Payer: 59 | Admitting: Internal Medicine

## 2019-10-01 ENCOUNTER — Other Ambulatory Visit: Payer: Self-pay

## 2019-10-01 ENCOUNTER — Encounter: Payer: Self-pay | Admitting: Internal Medicine

## 2019-10-01 VITALS — BP 100/70 | HR 68 | Temp 97.8°F | Ht 66.5 in | Wt 239.2 lb

## 2019-10-01 DIAGNOSIS — I482 Chronic atrial fibrillation, unspecified: Secondary | ICD-10-CM

## 2019-10-01 DIAGNOSIS — Z7901 Long term (current) use of anticoagulants: Secondary | ICD-10-CM | POA: Diagnosis not present

## 2019-10-01 DIAGNOSIS — R195 Other fecal abnormalities: Secondary | ICD-10-CM

## 2019-10-01 NOTE — Patient Instructions (Addendum)
You have been scheduled for a colonoscopy. Please follow written instructions given to you at your visit today.  Please pick up your prep supplies at the pharmacy within the next 1-3 days. If you use inhalers (even only as needed), please bring them with you on the day of your procedure. Your physician has requested that you go to www.startemmi.com and enter the access code given to you at your visit today. This web site gives a general overview about your procedure. However, you should still follow specific instructions given to you by our office regarding your preparation for the procedure.  YOU MAY HOLD YOUR XARELTO 2 DAYS PRIOR TO YOUR PROCEDURE PER DR Einar Gip.   If you are age 77 or older, your body mass index should be between 23-30. Your Body mass index is 38.03 kg/m. If this is out of the aforementioned range listed, please consider follow up with your Primary Care Provider.  If you are age 59 or younger, your body mass index should be between 19-25. Your Body mass index is 38.03 kg/m. If this is out of the aformentioned range listed, please consider follow up with your Primary Care Provider.   Due to recent changes in healthcare laws, you may see the results of your imaging and laboratory studies on MyChart before your provider has had a chance to review them.  We understand that in some cases there may be results that are confusing or concerning to you. Not all laboratory results come back in the same time frame and the provider may be waiting for multiple results in order to interpret others.  Please give [place] 48 hours in order for your provider to thoroughly review all the results before contacting the office for clarification of your results.   It was a pleasure to see you today!  Dr. Carlean Purl

## 2019-10-01 NOTE — H&P (View-Only) (Signed)
[name] 59 y.o. 1961/04/11 [id number]  Assessment & Plan:   Encounter Diagnoses  Name Primary?  . Positive colorectal cancer screening using Cologuard test Yes  . Long term current use of anticoagulant - Xarelto   . Chronic atrial fibrillation ([place])    Schedule colonoscopy at the hospital with ultraslim scope.Status post 2 failed colonoscopies in 2013 in 2015 despite using pediatric colonoscope. As per my note of 2019 I suspect she has both the sigmoid stricture related to her diverticulosis and perhaps a redundant colon as well.  In addition to the ultraslim scope we need an abdominal binder at hand.  Hold Xarelto 2 days prior to procedure.  Prior communication with Dr. Einar Gip indicates that if acceptable to hold Xarelto 2 days.  Extremely rare but real risk of stroke has been explained to the patient being off Xarelto but this is necessary for her invasive procedure.  RE: Hold xarelto Received: 2 months ago Message Contents  Adrian Prows, MD  Gatha Mayer, MD  I don't see any contraindications. Can hold 48 hours. Start when safe. Overall low risk.  Thanks [name].       Previous Messages   ----- Message -----  From: Gatha Mayer, MD  Sent: 07/28/2019  7:30 PM EST  To: Adrian Prows, MD  Subject: Hold [name],   She is going to do a colonoscopy in early 2021   Looks like ok to hold Xarelto for 2 days prior to that and restart the next day   Do you agree?   Thanks   [name]    The risks and benefits as well as alternatives of endoscopic procedure(s) have been discussed and reviewed. All questions answered. The patient agrees to proceed.   Pending colonoscopy, circle back around and discuss her urgent defecation she also has urinary issues that she related in 2019 that we did not discuss today and see what has been done about this and would consider pelvic floor physical therapy.  Subjective:   Chief Complaint:  Positive Cologuard  HPI [name] is here, she was seen in 2019 see my note of 05/19/2018, after having diverticulitis and 2 previous failed colonoscopies.  Now she presents with a positive Cologuard test that needs evaluation.  Her situation has not really changed with respect to urgent defecation in the mornings after eating a large meal, reducing the size of her meal will greatly reduce this problem more eliminate it.  She takes Xarelto for atrial fibrillation.      No Known Allergies Current Meds  Medication Sig  . flecainide (TAMBOCOR) 100 MG tablet Take 100 mg by mouth 2 (two) times daily.  . Melatonin 5 MG TABS Take 5 mg by mouth at bedtime.  . metoprolol tartrate (LOPRESSOR) 25 MG tablet TAKE 1 TABLET BY MOUTH TWICE A DAY  . nicotine (NICODERM CQ - DOSED IN MG/24 HOURS) 14 mg/24hr patch Place 14 mg onto the skin daily.  Marland Kitchen tolterodine (DETROL LA) 4 MG 24 hr capsule Take 4 mg by mouth daily.  Alveda Reasons 20 MG TABS tablet TAKE 1 TABLET (20 MG TOTAL) BY MOUTH DAILY WITH SUPPER.   Past Medical History:  Diagnosis Date  . A-fib ([place])   . Anxiety   . Arthritis   . Dysrhythmia    hx of afib, afib ablation in 01/2014, no afib since then  . Pneumonia  as a child  . Sigmoid diverticulitis    Past Surgical History:  Procedure Laterality Date  . ABDOMINAL HYSTERECTOMY  1989  . BLADDER SURGERY  2000  . BREAST IMPLANT REMOVAL    . CARDIAC ELECTROPHYSIOLOGY MAPPING AND ABLATION     for afib  . CESAREAN SECTION    . CHOLECYSTECTOMY    . loop recorder explant  2015  . LOOP RECORDER IMPLANT  2015  . PLACEMENT OF BREAST IMPLANTS    . TONSILLECTOMY AND ADENOIDECTOMY    . 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   family history includes Bladder Cancer in her father; Colon polyps in her father; Diabetes in her mother; Heart disease in her father and mother; Hemochromatosis in her paternal grandfather; Kidney disease in her father; Lung cancer in her sister.   Review of Systems As above  Objective:   Physical Exam BP 100/70   Pulse 68   Temp 97.8 F (36.6 C)   Ht 5' 6.5" (1.689 m)   Wt 239 lb 3.2 oz (108.5 kg)   BMI 38.03 kg/m

## 2019-10-01 NOTE — Progress Notes (Signed)
[name] 59 y.o. [birth year] [id number]  Assessment & Plan:   Encounter Diagnoses  Name Primary?  . Positive colorectal cancer screening using Cologuard test Yes  . Long term current use of anticoagulant - Xarelto   . Chronic atrial fibrillation ([place])    Schedule colonoscopy at the hospital with ultraslim scope.Status post 2 failed colonoscopies in 2013 in 2015 despite using pediatric colonoscope. As per my note of 2019 I suspect she has both the sigmoid stricture related to her diverticulosis and perhaps a redundant colon as well.  In addition to the ultraslim scope we need an abdominal binder at hand.  Hold Xarelto 2 days prior to procedure.  Prior communication with Dr. Einar Gip indicates that if acceptable to hold Xarelto 2 days.  Extremely rare but real risk of stroke has been explained to the patient being off Xarelto but this is necessary for her invasive procedure.  RE: Hold xarelto Received: 2 months ago Message Contents  Adrian Prows, MD  Gatha Mayer, MD  I don't see any contraindications. Can hold 48 hours. Start when safe. Overall low risk.  Thanks [name].       Previous Messages   ----- Message -----  From: Gatha Mayer, MD  Sent: 07/28/2019  7:30 PM EST  To: Adrian Prows, MD  Subject: Hold [name],   She is going to do a colonoscopy in early 2021   Looks like ok to hold Xarelto for 2 days prior to that and restart the next day   Do you agree?   Thanks   [name]    The risks and benefits as well as alternatives of endoscopic procedure(s) have been discussed and reviewed. All questions answered. The patient agrees to proceed.   Pending colonoscopy, circle back around and discuss her urgent defecation she also has urinary issues that she related in 2019 that we did not discuss today and see what has been done about this and would consider pelvic floor physical therapy.  Subjective:   Chief Complaint:  Positive Cologuard  HPI [name] is here, she was seen in 2019 see my note of 05/19/2018, after having diverticulitis and 2 previous failed colonoscopies.  Now she presents with a positive Cologuard test that needs evaluation.  Her situation has not really changed with respect to urgent defecation in the mornings after eating a large meal, reducing the size of her meal will greatly reduce this problem more eliminate it.  She takes Xarelto for atrial fibrillation.      No Known Allergies Current Meds  Medication Sig  . flecainide (TAMBOCOR) 100 MG tablet Take 100 mg by mouth 2 (two) times daily.  . Melatonin 5 MG TABS Take 5 mg by mouth at bedtime.  . metoprolol tartrate (LOPRESSOR) 25 MG tablet TAKE 1 TABLET BY MOUTH TWICE A DAY  . nicotine (NICODERM CQ - DOSED IN MG/24 HOURS) 14 mg/24hr patch Place 14 mg onto the skin daily.  Marland Kitchen tolterodine (DETROL LA) 4 MG 24 hr capsule Take 4 mg by mouth daily.  Alveda Reasons 20 MG TABS tablet TAKE 1 TABLET (20 MG TOTAL) BY MOUTH DAILY WITH SUPPER.   Past Medical History:  Diagnosis Date  . A-fib ([place])   . Anxiety   . Arthritis   . Dysrhythmia    hx of afib, afib ablation in 01/2014, no afib since then  . Pneumonia  as a child  . Sigmoid diverticulitis    Past Surgical History:  Procedure Laterality Date  . ABDOMINAL HYSTERECTOMY  1989  . BLADDER SURGERY  2000  . BREAST IMPLANT REMOVAL    . CARDIAC ELECTROPHYSIOLOGY MAPPING AND ABLATION     for afib  . CESAREAN SECTION    . CHOLECYSTECTOMY    . loop recorder explant  2015  . LOOP RECORDER IMPLANT  2015  . PLACEMENT OF BREAST IMPLANTS    . TONSILLECTOMY AND ADENOIDECTOMY    . 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   family history includes Bladder Cancer in her father; Colon polyps in her father; Diabetes in her mother; Heart disease in her father and mother; Hemochromatosis in her paternal grandfather; Kidney disease in her father; Lung cancer in her sister.   Review of Systems As above  Objective:   Physical Exam BP 100/70   Pulse 68   Temp 97.8 F (36.6 C)   Ht 5' 6.5" (1.689 m)   Wt 239 lb 3.2 oz (108.5 kg)   BMI 38.03 kg/m

## 2019-10-20 ENCOUNTER — Encounter (HOSPITAL_COMMUNITY): Payer: Self-pay | Admitting: Internal Medicine

## 2019-10-20 NOTE — Progress Notes (Signed)
Attempted to obtain medical history via telephone, unable to reach at this time. I left a voicemail to return pre surgical testing department's phone call.  

## 2019-10-21 ENCOUNTER — Other Ambulatory Visit (HOSPITAL_COMMUNITY)
Admission: RE | Admit: 2019-10-21 | Discharge: 2019-10-21 | Disposition: A | Discharge status: Home / Self Care | Payer: 59 | Source: Ambulatory Visit | Attending: Internal Medicine | Admitting: Internal Medicine

## 2019-10-21 DIAGNOSIS — Z20822 Contact with and (suspected) exposure to covid-19: Secondary | ICD-10-CM | POA: Diagnosis not present

## 2019-10-21 DIAGNOSIS — Z01812 Encounter for preprocedural laboratory examination: Secondary | ICD-10-CM | POA: Diagnosis not present

## 2019-10-21 LAB — SARS CORONAVIRUS 2 (TAT 6-24 HRS): SARS Coronavirus 2: NEGATIVE

## 2019-10-25 ENCOUNTER — Ambulatory Visit (HOSPITAL_COMMUNITY)
Admission: RE | Admit: 2019-10-25 | Discharge: 2019-10-25 | Disposition: A | Discharge status: Home / Self Care | Payer: 59 | Attending: Internal Medicine | Admitting: Internal Medicine

## 2019-10-25 ENCOUNTER — Ambulatory Visit (HOSPITAL_COMMUNITY): Payer: 59 | Admitting: Certified Registered"

## 2019-10-25 ENCOUNTER — Encounter (HOSPITAL_COMMUNITY): Payer: Self-pay | Admitting: Internal Medicine

## 2019-10-25 ENCOUNTER — Other Ambulatory Visit: Payer: Self-pay

## 2019-10-25 ENCOUNTER — Encounter (HOSPITAL_COMMUNITY)
Admission: RE | Disposition: A | Discharge status: Home / Self Care | Payer: Self-pay | Source: Home / Self Care | Attending: Internal Medicine

## 2019-10-25 DIAGNOSIS — D175 Benign lipomatous neoplasm of intra-abdominal organs: Secondary | ICD-10-CM | POA: Insufficient documentation

## 2019-10-25 DIAGNOSIS — Z87891 Personal history of nicotine dependence: Secondary | ICD-10-CM | POA: Diagnosis not present

## 2019-10-25 DIAGNOSIS — Z7901 Long term (current) use of anticoagulants: Secondary | ICD-10-CM | POA: Diagnosis not present

## 2019-10-25 DIAGNOSIS — K5730 Diverticulosis of large intestine without perforation or abscess without bleeding: Secondary | ICD-10-CM | POA: Insufficient documentation

## 2019-10-25 DIAGNOSIS — D123 Benign neoplasm of transverse colon: Secondary | ICD-10-CM | POA: Diagnosis not present

## 2019-10-25 DIAGNOSIS — I482 Chronic atrial fibrillation, unspecified: Secondary | ICD-10-CM | POA: Insufficient documentation

## 2019-10-25 DIAGNOSIS — D122 Benign neoplasm of ascending colon: Secondary | ICD-10-CM | POA: Diagnosis not present

## 2019-10-25 DIAGNOSIS — Z9882 Breast implant status: Secondary | ICD-10-CM | POA: Insufficient documentation

## 2019-10-25 DIAGNOSIS — I4891 Unspecified atrial fibrillation: Secondary | ICD-10-CM

## 2019-10-25 DIAGNOSIS — D120 Benign neoplasm of cecum: Secondary | ICD-10-CM

## 2019-10-25 DIAGNOSIS — R195 Other fecal abnormalities: Secondary | ICD-10-CM

## 2019-10-25 DIAGNOSIS — Z79899 Other long term (current) drug therapy: Secondary | ICD-10-CM | POA: Insufficient documentation

## 2019-10-25 DIAGNOSIS — Z96652 Presence of left artificial knee joint: Secondary | ICD-10-CM | POA: Insufficient documentation

## 2019-10-25 DIAGNOSIS — D125 Benign neoplasm of sigmoid colon: Secondary | ICD-10-CM

## 2019-10-25 DIAGNOSIS — K56699 Other intestinal obstruction unspecified as to partial versus complete obstruction: Secondary | ICD-10-CM | POA: Diagnosis not present

## 2019-10-25 HISTORY — PX: POLYPECTOMY: SHX5525

## 2019-10-25 HISTORY — PX: BIOPSY: SHX5522

## 2019-10-25 HISTORY — DX: Presence of spectacles and contact lenses: Z97.3

## 2019-10-25 HISTORY — PX: COLONOSCOPY WITH PROPOFOL: SHX5780

## 2019-10-25 HISTORY — DX: Presence of dental prosthetic device (complete) (partial): Z97.2

## 2019-10-25 SURGERY — COLONOSCOPY WITH PROPOFOL
Anesthesia: Monitor Anesthesia Care

## 2019-10-25 MED ORDER — LACTATED RINGERS IV SOLN
INTRAVENOUS | Status: DC
  Administered 2019-10-25: 10:00:00 1000 mL via INTRAVENOUS

## 2019-10-25 MED ORDER — PROPOFOL 10 MG/ML IV BOLUS
INTRAVENOUS | Status: AC
  Filled 2019-10-25: qty 20

## 2019-10-25 MED ORDER — PROPOFOL 500 MG/50ML IV EMUL
INTRAVENOUS | Status: DC | PRN
  Administered 2019-10-25: 135 ug/kg/min via INTRAVENOUS

## 2019-10-25 MED ORDER — PROPOFOL 10 MG/ML IV BOLUS
INTRAVENOUS | Status: DC | PRN
  Administered 2019-10-25 (×2): 10 mg via INTRAVENOUS

## 2019-10-25 MED ORDER — SODIUM CHLORIDE 0.9 % IV SOLN: INTRAVENOUS | Status: DC

## 2019-10-25 SURGICAL SUPPLY — 22 items

## 2019-10-25 NOTE — Anesthesia Procedure Notes (Signed)
Procedure Name: MAC Date/Time: 10/25/2019 10:36 AM Performed by: Niel Hummer, CRNA Pre-anesthesia Checklist: Patient identified, Emergency Drugs available, Suction available and Patient being monitored Oxygen Delivery Method: Simple face mask

## 2019-10-25 NOTE — Interval H&P Note (Signed)
History and Physical Interval Note:  10/25/2019 9:51 AM  [name]  has presented today for surgery, with the diagnosis of positive cologard.  The various methods of treatment have been discussed with the patient and family. After consideration of risks, benefits and other options for treatment, the patient has consented to  Procedure(s) with comments: COLONOSCOPY WITH PROPOFOL- using ultra slim scope and XL abdominal binder (N/A) - need ultra slim scope and XL abdominal binder as a surgical intervention.  The patient's history has been reviewed, patient examined, no change in status, stable for surgery.  I have reviewed the patient's chart and labs.  Questions were answered to the patient's satisfaction.     Silvano Rusk

## 2019-10-25 NOTE — Transfer of Care (Signed)
Immediate Anesthesia Transfer of Care Note  Patient: [name]  Procedure(s) Performed: COLONOSCOPY WITH PROPOFOL- using ultra slim scope and XL abdominal binder (N/A ) POLYPECTOMY BIOPSY  Patient Location: PACU  Anesthesia Type:MAC  Level of Consciousness: awake, alert  and oriented  Airway & Oxygen Therapy: Patient Spontanous Breathing and Patient connected to face mask oxygen  Post-op Assessment: Report given to RN, Post -op Vital signs reviewed and stable and Patient moving all extremities X 4  Post vital signs: Reviewed and stable  Last Vitals:  Vitals Value Taken Time  BP    Temp    Pulse    Resp    SpO2      Last Pain:  Vitals:   10/25/19 0934  TempSrc: Oral  PainSc: 0-No pain         Complications: No apparent anesthesia complications

## 2019-10-25 NOTE — Anesthesia Postprocedure Evaluation (Signed)
Anesthesia Post Note  Patient: [name]  Procedure(s) Performed: COLONOSCOPY WITH PROPOFOL- using ultra slim scope and XL abdominal binder (N/A ) POLYPECTOMY BIOPSY     Patient location during evaluation: PACU Anesthesia Type: MAC Level of consciousness: awake and alert and oriented Pain management: pain level controlled Vital Signs Assessment: post-procedure vital signs reviewed and stable Respiratory status: spontaneous breathing, nonlabored ventilation and respiratory function stable Cardiovascular status: blood pressure returned to baseline Postop Assessment: no apparent nausea or vomiting Anesthetic complications: no    Last Vitals:  Vitals:   10/25/19 1118 10/25/19 1120  BP: (!) 87/50 (!) 75/43  Pulse: 63 66  Resp: 16 17  Temp:  (!) 36.4 C  SpO2: 100% 100%    Last Pain:  Vitals:   10/25/19 1120  TempSrc:   PainSc: 0-No pain                 Brennan Bailey

## 2019-10-25 NOTE — Op Note (Signed)
[facility] Patient Name: [name] Procedure Date: 10/25/2019 MRN: [id number] Attending MD: Gatha Mayer , MD Date of Birth: 08-23-1961 CSN: [id number] Age: 59 Admit Type: Outpatient Procedure:                Colonoscopy Indications:              Positive Cologuard test Providers:                Gatha Mayer, MD, Josie Dixon, RN, Corie Chiquito, Technician, Maudry Diego, CRNA Referring MD:             Velna Hatchet Medicines:                Propofol per Anesthesia, Monitored Anesthesia Care Complications:            No immediate complications. Estimated Blood Loss:     Estimated blood loss was minimal. Procedure:                Pre-Anesthesia Assessment:                           - Prior to the procedure, a History and Physical                            was performed, and patient medications and                            allergies were reviewed. The patient's tolerance of                            previous anesthesia was also reviewed. The risks                            and benefits of the procedure and the sedation                            options and risks were discussed with the patient.                            All questions were answered, and informed consent                            was obtained. Prior Anticoagulants: The patient                            last took Xarelto (rivaroxaban) 2 days prior to the                            procedure. ASA Grade Assessment: III - A patient                            with severe systemic disease. After reviewing the  risks and benefits, the patient was deemed in                            satisfactory condition to undergo the procedure.                           After obtaining informed consent, the colonoscope                            was passed under direct vision. Throughout the                            procedure, the patient's blood  pressure, pulse, and                            oxygen saturations were monitored continuously. The                            [id number]-[id number] ([id number]) Olympus ultra slim endoscope                            was introduced through the anus and advanced to the                            the cecum, identified by appendiceal orifice and                            ileocecal valve. The colonoscopy was performed with                            difficulty due to restricted mobility of the colon,                            significant looping and a tortuous colon.                            Successful completion of the procedure was aided by                            abdominal binder and ultraslim scope. The quality                            of the bowel preparation was good. The bowel                            preparation used was Miralax via split dose                            instruction. The ileocecal valve, appendiceal                            orifice, and rectum were photographed. The patient  tolerated the procedure well. Scope In: 10:42:16 AM Scope Out: 11:13:29 AM Scope Withdrawal Time: 0 hours 20 minutes 18 seconds  Total Procedure Duration: 0 hours 31 minutes 13 seconds  Findings:      The perianal and digital rectal examinations were normal.      Five flat and sessile polyps were found in the sigmoid colon, transverse       colon, ascending colon and ileocecal valve. The polyps were 3 to 9 mm in       size. These polyps were removed with a cold snare. Resection and       retrieval were complete. Verification of patient identification for the       specimen was done. Estimated blood loss was minimal.      A few diverticula were found in the sigmoid colon.      A severe stenosis was found in the distal sigmoid colon and was       traversed.      The exam was otherwise without abnormality on direct and retroflexion       views. Impression:               -  Five 3 to 9 mm polyps in the sigmoid colon, in                            the transverse colon, in the ascending colon and at                            the ileocecal valve, removed with a cold snare.                            Resected and retrieved.                           - Diverticulosis in the sigmoid colon.                           - Stricture in the distal sigmoid colon. EXTRINSIC                            COMPRESSSION ? ADHESIONS REQUIRED ABDOMINAL BINDER                            AND ULTRASLIM SCOPE TO PASS AND COMPLETE EXAM AND                            WILL NEED THIS FOR FUTURE EXAMS                           - The examination was otherwise normal on direct                            and retroflexion views. Moderate Sedation:      Not Applicable - Patient had care per Anesthesia. Recommendation:           - Patient has a contact number available for  emergencies. The signs and symptoms of potential                            delayed complications were discussed with the                            patient. Return to normal activities tomorrow.                            Written discharge instructions were provided to the                            patient.                           - Resume previous diet.                           - Continue present medications.                           - Resume Xarelto (rivaroxaban) at prior dose                            tomorrow.                           - Repeat colonoscopy is recommended for                            surveillance. The colonoscopy date will be                            determined after pathology results from today's                            exam become available for review. Procedure Code(s):        --- Professional ---                           [phone], Colonoscopy, flexible; with removal of                            tumor(s), polyp(s), or other lesion(s) by snare                             technique Diagnosis Code(s):        --- Professional ---                           K63.5, Polyp of colon                           K56.699, Other intestinal obstruction unspecified                            as to partial versus complete obstruction  R19.5, Other fecal abnormalities                           K57.30, Diverticulosis of large intestine without                            perforation or abscess without bleeding CPT copyright 2019 American Medical Association. All rights reserved. The codes documented in this report are preliminary and upon coder review may  be revised to meet current compliance requirements. Gatha Mayer, MD 10/25/2019 11:35:00 AM This report has been signed electronically. Number of Addenda: 0

## 2019-10-25 NOTE — Anesthesia Preprocedure Evaluation (Addendum)
Anesthesia Evaluation  Patient identified by MRN, date of birth, ID band Patient awake    Reviewed: Allergy & Precautions, NPO status , Patient's Chart, lab work & pertinent test results, reviewed documented beta blocker date and time   History of Anesthesia Complications Negative for: history of anesthetic complications  Airway Mallampati: II  TM Distance: >3 FB Neck ROM: Full    Dental  (+) Edentulous Upper, Edentulous Lower   Pulmonary former smoker,    Pulmonary exam normal        Cardiovascular Pt. on home beta blockers Normal cardiovascular exam+ dysrhythmias (on flecainide and Xarelto) Atrial Fibrillation      Neuro/Psych Anxiety negative neurological ROS     GI/Hepatic negative GI ROS, Neg liver ROS,   Endo/Other  negative endocrine ROS  Renal/GU negative Renal ROS  negative genitourinary   Musculoskeletal  (+) Arthritis ,   Abdominal   Peds  Hematology negative hematology ROS (+)   Anesthesia Other Findings Day of surgery medications reviewed with patient.  Reproductive/Obstetrics negative OB ROS                            Anesthesia Physical Anesthesia Plan  ASA: III  Anesthesia Plan: MAC   Post-op Pain Management:    Induction:   PONV Risk Score and Plan: 2 and Treatment may vary due to age or medical condition and Propofol infusion  Airway Management Planned: Natural Airway and Nasal Cannula  Additional Equipment: None  Intra-op Plan:   Post-operative Plan:   Informed Consent: I have reviewed the patients History and Physical, chart, labs and discussed the procedure including the risks, benefits and alternatives for the proposed anesthesia with the patient or authorized representative who has indicated his/her understanding and acceptance.       Plan Discussed with: CRNA  Anesthesia Plan Comments:        Anesthesia Quick Evaluation

## 2019-10-25 NOTE — Discharge Instructions (Addendum)
YOU HAD AN ENDOSCOPIC PROCEDURE TODAY: Refer to the procedure report and other information in the discharge instructions given to you for any specific questions about what was found during the examination. If this information does not answer your questions, please call [facility] office at [phone] to clarify.   YOU SHOULD EXPECT: Some feelings of bloating in the abdomen. Passage of more gas than usual. Walking can help get rid of the air that was put into your GI tract during the procedure and reduce the bloating. If you had a lower endoscopy (such as a colonoscopy or flexible sigmoidoscopy) you may notice spotting of blood in your stool or on the toilet paper. Some abdominal soreness may be present for a day or two, also.  DIET: Your first meal following the procedure should be a light meal and then it is ok to progress to your normal diet. A half-sandwich or bowl of soup is an example of a good first meal. Heavy or fried foods are harder to digest and may make you feel nauseous or bloated. Drink plenty of fluids but you should avoid alcoholic beverages for 24 hours. If you had a esophageal dilation, please see attached instructions for diet.    ACTIVITY: Your care partner should take you home directly after the procedure. You should plan to take it easy, moving slowly for the rest of the day. You can resume normal activity the day after the procedure however YOU SHOULD NOT DRIVE, use power tools, machinery or perform tasks that involve climbing or major physical exertion for 24 hours (because of the sedation medicines used during the test).   SYMPTOMS TO REPORT IMMEDIATELY: A gastroenterologist can be reached at any hour. Please call [phone]  for any of the following symptoms:  . Following lower endoscopy (colonoscopy, flexible sigmoidoscopy) Excessive amounts of blood in the stool  Significant tenderness, worsening of abdominal pains  Swelling of the abdomen that is new, acute  Fever of 100  or higher  . Following upper endoscopy (EGD, EUS, ERCP, esophageal dilation) Vomiting of blood or coffee ground material  New, significant abdominal pain  New, significant chest pain or pain under the shoulder blades  Painful or persistently difficult swallowing  New shortness of breath  Black, tarry-looking or red, bloody stools  FOLLOW UP:  If any biopsies were taken you will be contacted by phone or by letter within the next 1-3 weeks. Call [phone]  if you have not heard about the biopsies in 3 weeks.  Please also call with any specific questions about appointments or follow up tests.I was able to complete the colonoscopy. I found and removed 5 small polyps.  I will let you know pathology results and when to have another routine colonoscopy by mail and/or My Chart.  RESUME [name]  I appreciate the opportunity to care for you. Gatha Mayer, MD, FACG  YOU HAD AN ENDOSCOPIC PROCEDURE TODAY: Refer to the procedure report and other information in the discharge instructions given to you for any specific questions about what was found during the examination. If this information does not answer your questions, please call Dr. Celesta Aver office at [phone] to clarify.   YOU SHOULD EXPECT: Some feelings of bloating in the abdomen. Passage of more gas than usual. Walking can help get rid of the air that was put into your GI tract during the procedure and reduce the bloating. If you had a lower endoscopy (such as a colonoscopy or flexible sigmoidoscopy) you may notice spotting  of blood in your stool or on the toilet paper. Some abdominal soreness may be present for a day or two, also.  DIET: Your first meal following the procedure should be a light meal and then it is ok to progress to your normal diet. A half-sandwich or bowl of soup is an example of a good first meal. Heavy or fried foods are harder to digest and may make you feel nauseous or bloated. Drink plenty of fluids but  you should avoid alcoholic beverages for 24 hours.   ACTIVITY: Your care partner should take you home directly after the procedure. You should plan to take it easy, moving slowly for the rest of the day. You can resume normal activity the day after the procedure however YOU SHOULD NOT DRIVE, use power tools, machinery or perform tasks that involve climbing or major physical exertion for 24 hours (because of the sedation medicines used during the test).   SYMPTOMS TO REPORT IMMEDIATELY: A gastroenterologist can be reached at any hour. Please call [phone]  for any of the following symptoms:  Following lower endoscopy (colonoscopy, flexible sigmoidoscopy) Excessive amounts of blood in the stool  Significant tenderness, worsening of abdominal pains  Swelling of the abdomen that is new, acute  Fever of 100 or higher

## 2019-10-26 ENCOUNTER — Encounter: Payer: Self-pay | Admitting: *Deleted

## 2019-10-26 ENCOUNTER — Encounter: Payer: Self-pay | Admitting: Internal Medicine

## 2019-10-26 LAB — SURGICAL PATHOLOGY

## 2019-10-28 ENCOUNTER — Ambulatory Visit: Payer: 59 | Attending: Internal Medicine

## 2019-10-28 DIAGNOSIS — Z23 Encounter for immunization: Secondary | ICD-10-CM | POA: Insufficient documentation

## 2019-10-28 NOTE — Progress Notes (Signed)
   Covid-19 Vaccination Clinic  Name:  [name]    MRN: [id number] DOB: 02/19/1961  10/28/2019  [name] was observed post Covid-19 immunization for 15 minutes without incident. She was provided with Vaccine Information Sheet and instruction to access the V-Safe system.   [name] was instructed to call 911 with any severe reactions post vaccine: Marland Kitchen Difficulty breathing  . Swelling of face and throat  . A fast heartbeat  . A bad rash all over body  . Dizziness and weakness   Immunizations Administered    Name Date Dose VIS Date Route   Pfizer COVID-19 Vaccine 10/28/2019  6:00 PM 0.3 mL 08/06/2019 Intramuscular   Manufacturer: [place]   Lot: [id number]   [place]: [id number]

## 2019-11-04 ENCOUNTER — Other Ambulatory Visit: Payer: Self-pay | Admitting: Cardiology

## 2019-11-04 NOTE — Telephone Encounter (Signed)
When is her next OV is what I want to know

## 2019-11-04 NOTE — Telephone Encounter (Signed)
No further refills without OV as I have not seen her in a year

## 2019-11-24 ENCOUNTER — Ambulatory Visit: Payer: 59 | Attending: Internal Medicine

## 2019-11-24 DIAGNOSIS — Z23 Encounter for immunization: Secondary | ICD-10-CM

## 2019-11-24 NOTE — Progress Notes (Signed)
   Covid-19 Vaccination Clinic  Name:  [name]    MRN: [id number] DOB: 16-Nov-1960  11/24/2019  [name] was observed post Covid-19 immunization for 15 minutes without incident. She was provided with Vaccine Information Sheet and instruction to access the V-Safe system.   [name] was instructed to call 911 with any severe reactions post vaccine: Marland Kitchen Difficulty breathing  . Swelling of face and throat  . A fast heartbeat  . A bad rash all over body  . Dizziness and weakness   Immunizations Administered    Name Date Dose VIS Date Route   Pfizer COVID-19 Vaccine 11/24/2019  3:15 PM 0.3 mL 08/06/2019 Intramuscular   Manufacturer: [place]   Lot: [id number]   [place]: [id number]

## 2019-12-04 ENCOUNTER — Other Ambulatory Visit: Payer: Self-pay | Admitting: Cardiology

## 2019-12-07 NOTE — Telephone Encounter (Signed)
Refill request

## 2019-12-13 ENCOUNTER — Other Ambulatory Visit: Payer: Self-pay | Admitting: Cardiology

## 2019-12-20 ENCOUNTER — Ambulatory Visit (HOSPITAL_COMMUNITY)
Admission: EM | Admit: 2019-12-20 | Discharge: 2019-12-20 | Disposition: A | Discharge status: Home / Self Care | Payer: 59 | Attending: Family Medicine | Admitting: Family Medicine

## 2019-12-20 ENCOUNTER — Ambulatory Visit (HOSPITAL_COMMUNITY): Payer: 59

## 2019-12-20 ENCOUNTER — Telehealth: Payer: Self-pay

## 2019-12-20 ENCOUNTER — Other Ambulatory Visit: Payer: Self-pay

## 2019-12-20 ENCOUNTER — Ambulatory Visit (INDEPENDENT_AMBULATORY_CARE_PROVIDER_SITE_OTHER): Payer: 59

## 2019-12-20 ENCOUNTER — Encounter (HOSPITAL_COMMUNITY): Payer: Self-pay

## 2019-12-20 ENCOUNTER — Ambulatory Visit: Payer: 59 | Admitting: Physician Assistant

## 2019-12-20 DIAGNOSIS — Z8249 Family history of ischemic heart disease and other diseases of the circulatory system: Secondary | ICD-10-CM | POA: Insufficient documentation

## 2019-12-20 DIAGNOSIS — W010XXA Fall on same level from slipping, tripping and stumbling without subsequent striking against object, initial encounter: Secondary | ICD-10-CM | POA: Diagnosis not present

## 2019-12-20 DIAGNOSIS — Z87891 Personal history of nicotine dependence: Secondary | ICD-10-CM | POA: Diagnosis not present

## 2019-12-20 DIAGNOSIS — M79605 Pain in left leg: Secondary | ICD-10-CM

## 2019-12-20 DIAGNOSIS — W19XXXA Unspecified fall, initial encounter: Secondary | ICD-10-CM

## 2019-12-20 DIAGNOSIS — Z79899 Other long term (current) drug therapy: Secondary | ICD-10-CM | POA: Diagnosis not present

## 2019-12-20 DIAGNOSIS — Z8371 Family history of colonic polyps: Secondary | ICD-10-CM | POA: Diagnosis not present

## 2019-12-20 DIAGNOSIS — Z833 Family history of diabetes mellitus: Secondary | ICD-10-CM | POA: Diagnosis not present

## 2019-12-20 DIAGNOSIS — R2242 Localized swelling, mass and lump, left lower limb: Secondary | ICD-10-CM | POA: Diagnosis not present

## 2019-12-20 DIAGNOSIS — Z8052 Family history of malignant neoplasm of bladder: Secondary | ICD-10-CM | POA: Diagnosis not present

## 2019-12-20 DIAGNOSIS — J90 Pleural effusion, not elsewhere classified: Secondary | ICD-10-CM | POA: Diagnosis not present

## 2019-12-20 DIAGNOSIS — Z7901 Long term (current) use of anticoagulants: Secondary | ICD-10-CM | POA: Insufficient documentation

## 2019-12-20 DIAGNOSIS — Z841 Family history of disorders of kidney and ureter: Secondary | ICD-10-CM | POA: Diagnosis not present

## 2019-12-20 DIAGNOSIS — Z801 Family history of malignant neoplasm of trachea, bronchus and lung: Secondary | ICD-10-CM | POA: Diagnosis not present

## 2019-12-20 DIAGNOSIS — Z96652 Presence of left artificial knee joint: Secondary | ICD-10-CM | POA: Diagnosis not present

## 2019-12-20 DIAGNOSIS — F419 Anxiety disorder, unspecified: Secondary | ICD-10-CM | POA: Insufficient documentation

## 2019-12-20 DIAGNOSIS — S2232XA Fracture of one rib, left side, initial encounter for closed fracture: Secondary | ICD-10-CM

## 2019-12-20 DIAGNOSIS — I4891 Unspecified atrial fibrillation: Secondary | ICD-10-CM | POA: Diagnosis not present

## 2019-12-20 DIAGNOSIS — R300 Dysuria: Secondary | ICD-10-CM | POA: Diagnosis present

## 2019-12-20 LAB — POCT URINALYSIS DIP (DEVICE)
Bilirubin Urine: NEGATIVE
Glucose, UA: NEGATIVE mg/dL
Ketones, ur: NEGATIVE mg/dL
Leukocytes,Ua: NEGATIVE
Nitrite: NEGATIVE
Protein, ur: NEGATIVE mg/dL
Specific Gravity, Urine: <=1.005 (ref 1.005–1.030)
Urobilinogen, UA: 0.2 mg/dL (ref 0.0–1.0)
pH: 6 (ref 5.0–8.0)

## 2019-12-20 MED ORDER — HYDROCODONE-ACETAMINOPHEN 5-325 MG PO TABS: 1.0000 | ORAL_TABLET | Freq: Four times a day (QID) | ORAL | 0 refills | Status: DC | PRN

## 2019-12-20 NOTE — ED Triage Notes (Signed)
Patient reports fall 8 days ago. Reports that she has a bruise on her left chest, but woke up today with bruising on her left ankle and toes.

## 2019-12-20 NOTE — ED Provider Notes (Signed)
[place]    CSN: [id number] Arrival date & time: 12/20/19  1226      History   Chief Complaint Chief Complaint  Patient presents with  . Fall  . Dysuria    HPI [name] is a 59 y.o. female history of A. fib on Xarelto, presenting today for evaluation of chest pain and left lower leg swelling after a fall.  Patient notes that she tripped and fell onto a concrete driveway approximately 8 days ago.  She initially developed some swelling and bruising to her left upper chest/axilla area, but this has been improving, reports continued soreness here.  She is more concerned as she has developed an increasingly worsening pain to her left lower rib cage and underneath left breast.  She reports associated difficulty breathing and shortness of breath associated with this.  She is also concerned as she has noticed some swelling to her left lower leg.  Notes that she initially had a small cut to this area but this has been healing well.  She has noticed increased swelling as well as felt like her toes appeared more purple today.  She denies any pain or swelling in her calf.  Denies prior DVT/PEs.  Consistently taking her Xarelto.  HPI  Past Medical History:  Diagnosis Date  . A-fib ([place])   . Anxiety   . Arthritis   . Dysrhythmia    hx of afib, afib ablation in 01/2014, no afib since then  . Pneumonia    as a child  . Sigmoid diverticulitis   . Wears contact lenses   . Wears dentures   . Wears glasses     Patient Active Problem List   Diagnosis Date Noted  . Positive colorectal cancer screening using Cologuard test   . Benign neoplasm of ascending colon   . Benign neoplasm of ileocecal valve   . Benign neoplasm of transverse colon   . Benign neoplasm of sigmoid colon   . Acute diverticulitis 02/23/2018  . A-fib ([place]) 02/23/2018  . Anxiety 02/23/2018  . Urinary incontinence 02/23/2018  . S/P TKR (total knee replacement), left 07/10/2017  . Unilateral primary  osteoarthritis, left knee 06/23/2017  . Chondromalacia of left patella 01/16/2017  . TOBACCO ABUSE 10/25/2008  . PALPITATIONS 10/25/2008  . DYSPNEA 10/25/2008    Past Surgical History:  Procedure Laterality Date  . ABDOMINAL HYSTERECTOMY  1989  . BIOPSY  10/25/2019   Procedure: BIOPSY;  Surgeon: Gatha Mayer, MD;  Location: [facility];  Service: Endoscopy;;  . BLADDER SURGERY  2000  . BREAST IMPLANT REMOVAL    . CARDIAC ELECTROPHYSIOLOGY MAPPING AND ABLATION     for afib  . CESAREAN SECTION    . CHOLECYSTECTOMY    . COLONOSCOPY WITH PROPOFOL N/A 10/25/2019   Procedure: COLONOSCOPY WITH PROPOFOL- using ultra slim scope and XL abdominal binder;  Surgeon: Gatha Mayer, MD;  Location: [facility];  Service: Endoscopy;  Laterality: N/A;  need ultra slim scope and XL abdominal binder  . loop recorder explant  2015  . LOOP RECORDER IMPLANT  2015  . PLACEMENT OF BREAST IMPLANTS    . POLYPECTOMY  10/25/2019   Procedure: POLYPECTOMY;  Surgeon: Gatha Mayer, MD;  Location: [facility];  Service: Endoscopy;;  . TONSILLECTOMY AND ADENOIDECTOMY    . TOTAL KNEE ARTHROPLASTY Left 07/10/2017   Procedure: LEFT TOTAL KNEE ARTHROPLASTY;  Surgeon: Leandrew Koyanagi, MD;  Location: [facility];  Service: Orthopedics;  Laterality: Left;    OB  History   No obstetric history on file.      Home Medications    Prior to Admission medications   Medication Sig Start Date End Date Taking? Authorizing Provider  ALPRAZolam Duanne Moron) 0.5 MG tablet Take 0.5 mg by mouth 2 (two) times daily as needed for anxiety. 09/22/19   [provider]  flecainide (TAMBOCOR) 100 MG tablet TAKE 1 TABLET BY MOUTH TWICE A DAY 12/07/19   Adrian Prows, MD  HYDROcodone-acetaminophen (NORCO/VICODIN) 5-325 MG tablet Take 1-2 tablets by mouth every 6 (six) hours as needed for severe pain. 12/20/19   Rachella Basden C, PA-C  metoprolol tartrate (LOPRESSOR) 50 MG tablet Take 50 mg by mouth 2 (two) times daily.    [provider]  tolterodine (DETROL LA) 4 MG 24 hr capsule Take 4 mg by mouth daily.    [provider]  XARELTO 20 MG TABS tablet TAKE 1 TABLET (20 MG TOTAL) BY MOUTH DAILY WITH SUPPER. 06/07/19   Adrian Prows, MD    Family History Family History  Problem Relation Age of Onset  . Diabetes Mother   . Heart disease Mother   . Colon polyps Father   . Bladder Cancer Father   . Heart disease Father   . Kidney disease Father   . Hemochromatosis Paternal Grandfather   . Lung cancer Sister   . Colon cancer Neg Hx   . Esophageal cancer Neg Hx   . Liver disease Neg Hx     Social History Social History   Tobacco Use  . Smoking status: Former Smoker    Packs/day: 0.25    Types: Cigarettes    Quit date: 08/2019    Years since quitting: 0.3  . Smokeless tobacco: Never Used  . Tobacco comment: d/c as of 08/2019  Substance Use Topics  . Alcohol use: No  . Drug use: No     Allergies   Patient has no known allergies.   Review of Systems Review of Systems  Constitutional: Negative for activity change, appetite change, chills, fatigue and fever.  HENT: Negative for congestion, ear pain, rhinorrhea, sinus pressure, sore throat and trouble swallowing.   Eyes: Negative for discharge and redness.  Respiratory: Positive for shortness of breath. Negative for cough and chest tightness.   Cardiovascular: Positive for chest pain and leg swelling.  Gastrointestinal: Negative for abdominal pain, diarrhea, nausea and vomiting.  Musculoskeletal: Negative for myalgias.  Skin: Negative for rash.  Neurological: Negative for dizziness, light-headedness and headaches.     Physical Exam Triage Vital Signs ED Triage Vitals  Enc Vitals Group     BP 12/20/19 1254 120/66     Pulse Rate 12/20/19 1254 74     Resp 12/20/19 1254 14     Temp --      Temp Source 12/20/19 1254 Oral     SpO2 12/20/19 1254 100 %     Weight --      Height --      Head Circumference --      Peak Flow --      Pain Score  12/20/19 1251 5     Pain Loc --      Pain Edu? --      Excl. in [place]? --    No data found.  Updated Vital Signs BP 120/66 (BP Location: Right Arm)   Pulse 74   Resp 14   SpO2 100%   Visual Acuity Right Eye Distance:   Left Eye Distance:   Bilateral  Distance:    Right Eye Near:   Left Eye Near:    Bilateral Near:     Physical Exam Vitals and nursing note reviewed.  Constitutional:      Appearance: She is well-developed.     Comments: No acute distress  HENT:     Head: Normocephalic and atraumatic.     Nose: Nose normal.  Eyes:     Conjunctiva/sclera: Conjunctivae normal.  Cardiovascular:     Rate and Rhythm: Normal rate.  Pulmonary:     Effort: Pulmonary effort is normal. No respiratory distress.     Breath sounds: Normal breath sounds.  Abdominal:     General: There is no distension.  Musculoskeletal:        General: Normal range of motion.     Cervical back: Neck supple.     Comments: Left anterior lower leg with well scabbed wound with surrounding tenderness, no surrounding erythema or warmth, mild swelling noted compared to right  No calf swelling erythema or tenderness  Skin:    General: Skin is warm and dry.  Neurological:     Mental Status: She is alert and oriented to person, place, and time.      UC Treatments / Results  Labs (all labs ordered are listed, but only abnormal results are displayed) Labs Reviewed  POCT URINALYSIS DIP (DEVICE) - Abnormal; Notable for the following components:      Result Value   Hgb urine dipstick TRACE (*)    All other components within normal limits    EKG   Radiology DG Ribs Unilateral W/Chest Left  Result Date: 12/20/2019 CLINICAL DATA:  59 year old female status post fall 8 days ago with increasing left chest pain, left leg pain and swelling. EXAM: LEFT RIBS AND CHEST - 3+ VIEW COMPARISON:  Chest CT 12/17/2018 and earlier. FINDINGS: Lung volumes and mediastinal contours are stable, cardiac size at the upper  limits of normal. Visualized tracheal air column is within normal limits. Stable lung markings with mild chronic increased interstitium. No pneumothorax. But evidence of a small layering left pleural effusion. Possibly comminuted but nondisplaced fracture of the left anterior 5th rib on image 2. No other left rib fracture identified. Other visible osseous structures appear stable and intact. Paucity of bowel gas. IMPRESSION: 1. Nondisplaced fracture of the left anterior 5th rib with small left pleural effusion. 2.  No pneumothorax or other acute cardiopulmonary abnormality. Electronically Signed   By: Genevie Ann M.D.   On: 12/20/2019 13:46   DG Tibia/Fibula Left  Result Date: 12/20/2019 CLINICAL DATA:  Acute left leg pain after fall last week. EXAM: LEFT TIBIA AND FIBULA - 2 VIEW COMPARISON:  None. FINDINGS: There is no evidence of fracture or other focal bone lesions. Soft tissues are unremarkable. IMPRESSION: Negative. Electronically Signed   By: Marijo Conception M.D.   On: 12/20/2019 13:44    Procedures Procedures (including critical care time)  Medications Ordered in UC Medications - No data to display  Initial Impression / Assessment and Plan / UC Course  I have reviewed the triage vital signs and the nursing notes.  Pertinent labs & imaging results that were available during my care of the patient were reviewed by me and considered in my medical decision making (see chart for details).     X-ray of the leg negative, do not suspect DVT, on Xarelto, tenderness and swelling located anteriorly.  Chest x-ray does show rib fracture as well as small pleural effusion.  Discussed with  Dr. Mannie Stabile who recommended continuing outpatient treatment with pain management and follow-up chest x-ray in 1 week to ensure her effusion improving and not worsening.  If developing increased shortness of breath or difficulty breathing to follow-up in the emergency room.  Provided with incentive spirometer.  Tylenol for  mild to moderate pain.  Hydrocodone for severe pain.  Discussed strict return precautions. Patient verbalized understanding and is agreeable with plan.  Final Clinical Impressions(s) / UC Diagnoses   Final diagnoses:  Fall, initial encounter  Closed fracture of one rib of left side, initial encounter  Localized swelling of left lower leg     Discharge Instructions     No fracture on leg 5th rib fracture with small amount of fluid on xray Follow up in 1 week for follow up chest xray to ensure fluid improving Use incentive spirometer to help open lungs Tylenol for mild-moderate pain Hydrocodone for severe pain  If developing increased pain/difficulty breathing follow up in emergency room    ED Prescriptions    Medication Sig Dispense Auth. Provider   HYDROcodone-acetaminophen (NORCO/VICODIN) 5-325 MG tablet Take 1-2 tablets by mouth every 6 (six) hours as needed for severe pain. 15 tablet Arneshia Ade, [place] C, PA-C     I have reviewed the PDMP during this encounter.   Janith Lima, PA-C 12/20/19 1544

## 2019-12-20 NOTE — Discharge Instructions (Signed)
No fracture on leg 5th rib fracture with small amount of fluid on xray Follow up in 1 week for follow up chest xray to ensure fluid improving Use incentive spirometer to help open lungs Tylenol for mild-moderate pain Hydrocodone for severe pain  If developing increased pain/difficulty breathing follow up in emergency room

## 2019-12-20 NOTE — Telephone Encounter (Signed)
Patient called triage line. She states that she has left leg pain that started following a fall last week. She states that it has worsened and now swollen. She also states that she has shortness of breath. I recommended and explained that patient should go to the ED due to her shortness of breath. She refused and states that she just needs to be seen for her leg. I scheduled patient for this afternoon.

## 2019-12-22 NOTE — Progress Notes (Signed)
Primary Physician/Referring:  Velna Hatchet, MD  Patient ID: [name], female    DOB: 1960-10-29, 59 y.o.   MRN: [id number]  Chief Complaint  Patient presents with  . Atrial Fibrillation  . Follow-up    1 year   HPI:    [name]  is a 59 y.o. female with paroxysmal A. Fib diagnosed in 2015 and tried several medications that she did not help. She then underwent successful ablation; however, in Jan 2019 noted to have recurrent A fib with RVR on event monitor and is now on flecainide.  Patient was seen in the Urgent Care on 12/20/2019 for a broken rib and leg edema due to an accidental fall.  States that since then she has had occasional brief atrial fibrillation, feels well.  Past Medical History:  Diagnosis Date  . A-fib ([place])   . Anxiety   . Arthritis   . Dysrhythmia    hx of afib, afib ablation in 01/2014, no afib since then  . Fractured rib 2021  . Pneumonia    as a child  . Sigmoid diverticulitis   . Wears contact lenses   . Wears dentures   . Wears glasses    Past Surgical History:  Procedure Laterality Date  . ABDOMINAL HYSTERECTOMY  1989  . BIOPSY  10/25/2019   Procedure: BIOPSY;  Surgeon: Gatha Mayer, MD;  Location: [facility];  Service: Endoscopy;;  . BLADDER SURGERY  2000  . BREAST IMPLANT REMOVAL    . CARDIAC ELECTROPHYSIOLOGY MAPPING AND ABLATION     for afib  . CESAREAN SECTION    . CHOLECYSTECTOMY    . COLONOSCOPY WITH PROPOFOL N/A 10/25/2019   Procedure: COLONOSCOPY WITH PROPOFOL- using ultra slim scope and XL abdominal binder;  Surgeon: Gatha Mayer, MD;  Location: [facility];  Service: Endoscopy;  Laterality: N/A;  need ultra slim scope and XL abdominal binder  . loop recorder explant  2015  . LOOP RECORDER IMPLANT  2015  . PLACEMENT OF BREAST IMPLANTS    . POLYPECTOMY  10/25/2019   Procedure: POLYPECTOMY;  Surgeon: Gatha Mayer, MD;  [facility];  Service: Endoscopy;;  . TONSILLECTOMY AND ADENOIDECTOMY    . TOTAL KNEE  ARTHROPLASTY Left 07/10/2017   Procedure: LEFT TOTAL KNEE ARTHROPLASTY;  Surgeon: Leandrew Koyanagi, MD;  Location: [facility];  Service: Orthopedics;  Laterality: Left;   Family History  Problem Relation Age of Onset  . Diabetes Mother   . Heart disease Mother   . Colon polyps Father   . Bladder Cancer Father   . Heart disease Father   . Kidney disease Father   . Hemochromatosis Paternal Grandfather   . Lung cancer Sister   . Colon cancer Neg Hx   . Esophageal cancer Neg Hx   . Liver disease Neg Hx     Social History   Tobacco Use  . Smoking status: Former Smoker    Packs/day: 0.25    Types: Cigarettes    Quit date: 06/27/2019    Years since quitting: 0.4  . Smokeless tobacco: Never Used  . Tobacco comment: d/c as of 08/2019  Substance Use Topics  . Alcohol use: No   Marital Status: Divorced  ROS  Review of Systems  Cardiovascular: Positive for chest pain (left rib fracture). Negative for dyspnea on exertion and leg swelling.  Musculoskeletal: Positive for joint pain (left ankle after a fall).  Gastrointestinal: Negative for melena.   Objective  Blood pressure 118/65, pulse 70,  temperature 98 F (36.7 C), temperature source Temporal, resp. rate 15, height 5\' 6"  (1.676 m), weight 239 lb (108.4 kg), SpO2 95 %.  Vitals with BMI 12/23/2019 12/20/2019 10/25/2019  Height 5\' 6"  - -  Weight 239 lbs - -  BMI 0000000 - -  Systolic 123456 123456 86  Diastolic 65 66 61  Pulse 70 74 56     Physical Exam  Constitutional:  Moderately obese  Cardiovascular: Normal rate, regular rhythm, normal heart sounds and intact distal pulses. Exam reveals no gallop.  No murmur heard. No leg edema, no JVD.  Pulmonary/Chest: Effort normal and breath sounds normal.  Abdominal: Soft. Bowel sounds are normal.   Laboratory examination:   No results for input(s): NA, K, CL, CO2, GLUCOSE, BUN, CREATININE, CALCIUM, GFRNONAA, GFRAA in the last 8760 hours. CrCl cannot be calculated (Patient's most recent lab result  is older than the maximum 21 days allowed.).  CMP Latest Ref Rng & Units 11/20/2018 02/27/2018 02/25/2018  Glucose 65 - 99 mg/dL 82 104(H) 83  BUN 6 - 24 mg/dL 10 <5(L) <5(L)  Creatinine 0.57 - 1.00 mg/dL 0.79 0.67 0.67  Sodium 134 - 144 mmol/L 143 143 139  Potassium 3.5 - 5.2 mmol/L 4.1 3.6 3.6  Chloride 96 - 106 mmol/L 106 112(H) 107  CO2 20 - 29 mmol/L 24 26 25   Calcium 8.7 - 10.2 mg/dL 8.9 8.2(L) 8.3(L)  Total Protein 6.0 - 8.5 g/dL 6.6 6.0(L) -  Total Bilirubin 0.0 - 1.2 mg/dL 0.3 0.3 -  Alkaline Phos 39 - 117 IU/L 139(H) 123 -  AST 0 - 40 IU/L 19 21 -  ALT 0 - 32 IU/L 16 22 -   CBC Latest Ref Rng & Units 11/20/2018 03/16/2018 02/27/2018  WBC 3.4 - 10.8 x10E3/uL 5.2 9.5 4.9  Hemoglobin 11.1 - 15.9 g/dL 14.9 14.9 12.0  Hematocrit 34.0 - 46.6 % 43.3 45.7 36.9  Platelets 150 - 450 x10E3/uL 228 242 199   Lipid Panel     Component Value Date/Time   CHOL 167 11/20/2018 0736   TRIG 81 11/20/2018 0736   HDL 43 11/20/2018 0736   LDLCALC 108 (H) 11/20/2018 0736   HEMOGLOBIN A1C No results found for: HGBA1C, MPG TSH No results for input(s): TSH in the last 8760 hours.  External labs:   11/20/18: TSH 2.000.   Medications and allergies  No Known Allergies   Current Outpatient Medications  Medication Instructions  . ALPRAZolam (XANAX) 0.5 mg, Oral, 2 times daily PRN  . atorvastatin (LIPITOR) 10 mg, Oral, Daily  . flecainide (TAMBOCOR) 100 MG tablet TAKE 1 TABLET BY MOUTH TWICE A DAY  . HYDROcodone-acetaminophen (NORCO/VICODIN) 5-325 MG tablet 1-2 tablets, Oral, Every 6 hours PRN  . metoprolol tartrate (LOPRESSOR) 50 mg, Oral, 2 times daily  . tolterodine (DETROL LA) 4 mg, Oral, Daily  . Xarelto 20 mg, Oral, Daily with supper   Radiology:   CT chest 12/17/2018: No acute abnormality is noted in the chest. Coronary artery calcifications are noted suggesting coronary artery disease. Aortic Atherosclerosis   Cardiac Studies:   Nuclear stress test  [2015]: Normal.  A. Fib  Ablation 2015 at [facility].  Event Monitor 30 days 08/28/2017: Paroxysmal sustained A. Fib with RVR - Symptomatic with palpitations. Latest episode was on 09/02/2017.  Echocardiogram 09/02/2017: Left ventricle cavity is normal in size. Moderate concentric hypertrophy of the left ventricle. Low normal LV systolic function. Normal diastolic filling pattern, normal LAP. Left ventricle regional wall motion findings: No wall motion  abnormalities. Visual EF is 50-55%. Calculated EF 48%.  Mild tricuspid regurgitation. No evidence of pulmonary hypertension. Patient in sinus rhythm during exam.  Vascular US Lower Extremity Venous Reflux 07/02/2018: Left: Abnormal reflux times were noted in the common femoral vein, great saphenous vein at the saphenofemoral junction, and great saphenous vein at the knee. There is no evidence of deep vein thrombosis in the lower extremity.There is no evidence of superficial venous thrombosis.  EKG    EKG 12/23/2019: Normal sinus rhythm at rate of 63 bpm, normal axis.  Nonspecific T abnormality in V1 to V3, probably normal variant but cannot exclude ischemia.  Normal QT interval.  Low-voltage complexes.      Assessment     ICD-10-CM   1. Paroxysmal atrial fibrillation ([place]). CHA2DS2-VASc Score is 1. Yearly risk of stroke: 1.2% (Atherosclerosis)  I48.0 EKG 12-Lead  2. Moderate obesity  E66.8   3. Coronary artery calcification seen on CAT scan 12/17/2018  I25.10 atorvastatin (LIPITOR) 10 MG tablet  4. Mild hyperlipidemia  E78.5 atorvastatin (LIPITOR) 10 MG tablet    Meds ordered this encounter  Medications  . atorvastatin (LIPITOR) 10 MG tablet    Sig: Take 1 tablet (10 mg total) by mouth daily.    Dispense:  90 tablet    Refill:  3    There are no discontinued medications.  Recommendations:   [name]  is a  59 y.o. female with paroxysmal A. Fib diagnosed in 2015 and tried several medications that she did not help. She then underwent successful  ablation; however, in Jan 2019 noted to have recurrent A fib with RVR on event monitor and is now on flecainide.  Since being on flecainide, she is feeling the best she has with regard to palpitations, with occasional episodes of breakthrough.  She is presently on Eliquis, I discussed with her that her cardioembolic risk is 1.0 and she could certainly discontinue this and be on aspirin 81 mg daily.  I reviewed her CT scan report from the emergency room and also advised her regarding coronary calcification.  In view of this, although her LDL is only minimally elevated, I started her on Lipitor 10 mg daily, she will follow up with PCP in the near future for labs.  In view of coronary calcification, I also discussed with her regarding weight loss.  Fortunately she has quit smoking and has remained abstinent.  For weight loss, she could try Wellbutrin, patient has medications at home she will give it a second shot, previously did not like how it made her feel when she was trying to quit smoking.  Adrian Prows, MD, [facility] 12/23/2019, 3:48 PM [facility]. [place] Office: [phone]

## 2019-12-23 ENCOUNTER — Ambulatory Visit: Payer: BLUE CROSS/BLUE SHIELD | Admitting: Cardiology

## 2019-12-23 ENCOUNTER — Other Ambulatory Visit: Payer: Self-pay

## 2019-12-23 ENCOUNTER — Encounter: Payer: Self-pay | Admitting: Cardiology

## 2019-12-23 VITALS — BP 118/65 | HR 70 | Temp 98.0°F | Resp 15 | Ht 66.0 in | Wt 239.0 lb

## 2019-12-23 DIAGNOSIS — E668 Other obesity: Secondary | ICD-10-CM

## 2019-12-23 DIAGNOSIS — E785 Hyperlipidemia, unspecified: Secondary | ICD-10-CM

## 2019-12-23 DIAGNOSIS — I2510 Atherosclerotic heart disease of native coronary artery without angina pectoris: Secondary | ICD-10-CM

## 2019-12-23 DIAGNOSIS — I480 Paroxysmal atrial fibrillation: Secondary | ICD-10-CM

## 2019-12-23 MED ORDER — ATORVASTATIN CALCIUM 10 MG PO TABS: 10.0000 mg | ORAL_TABLET | Freq: Every day | ORAL | 3 refills | Status: DC

## 2019-12-24 ENCOUNTER — Telehealth: Payer: Self-pay

## 2019-12-24 NOTE — Telephone Encounter (Signed)
Pt called in stating that she had an appt 4/29 and came in with all her meds but when she got home, her hydrocodone was missing. I checked with nursing staff and they also checked the room the pt was in but nothing was found. Pt made aware of no meds found nor turned in.//ah

## 2020-01-05 ENCOUNTER — Other Ambulatory Visit: Payer: Self-pay | Admitting: Cardiology

## 2020-01-05 NOTE — Telephone Encounter (Signed)
Please fill

## 2020-03-03 ENCOUNTER — Other Ambulatory Visit: Payer: Self-pay

## 2020-03-03 ENCOUNTER — Ambulatory Visit (INDEPENDENT_AMBULATORY_CARE_PROVIDER_SITE_OTHER): Payer: 59 | Admitting: Physician Assistant

## 2020-03-03 ENCOUNTER — Ambulatory Visit (HOSPITAL_COMMUNITY): Payer: Self-pay

## 2020-03-03 ENCOUNTER — Encounter: Payer: Self-pay | Admitting: Physician Assistant

## 2020-03-03 ENCOUNTER — Ambulatory Visit (INDEPENDENT_AMBULATORY_CARE_PROVIDER_SITE_OTHER): Payer: 59

## 2020-03-03 VITALS — Ht 66.0 in | Wt 239.0 lb

## 2020-03-03 DIAGNOSIS — M545 Low back pain, unspecified: Secondary | ICD-10-CM

## 2020-03-03 MED ORDER — PREDNISONE 10 MG PO TABS
20.0000 mg | ORAL_TABLET | Freq: Every day | ORAL | 0 refills | Status: DC
Start: 2020-03-03 — End: 2020-03-27

## 2020-03-03 MED ORDER — METHOCARBAMOL 500 MG PO TABS
500.0000 mg | ORAL_TABLET | Freq: Four times a day (QID) | ORAL | 0 refills | Status: DC | PRN
Start: 2020-03-03 — End: 2021-07-02

## 2020-03-03 NOTE — Progress Notes (Signed)
Office Visit Note   Patient: [name]           Date of Birth: [birth year]           MRN: [id number] Visit Date: 03/03/2020              Requested by: Velna Hatchet, MD [address] [place],  [place] [id number] PCP: Velna Hatchet, MD  Chief Complaint  Patient presents with  . Lower Back - Pain, Injury, New Patient (Initial Visit)      HPI: This is a pleasant 59 year old woman with a 2-week history of lower back pain.  She denies any history of any back issues in the past.  She states that she was helping move a friend out of a chair who weighed about 300 pounds.  She was unable to do it in both of them slid to the ground.  She denies it being a fall but says they slid down onto the ground.  Since then she has had significant lower back pain that radiates from her lower back into her bilateral buttocks.  She especially has difficulty when she is sitting or rising to a standing position she has to lean forward and then stand up.  She denies any weakness.  She denies any radiation of pain down into her legs or her feet.  She denies any paresthesias.  She saw an urgent care they gave her a few muscle relaxants but only gave her 4 of the Assessment & Plan: Visit Diagnoses:  1. Acute low back pain, unspecified back pain laterality, unspecified whether sciatica present     Plan: Patient will be started on prednisone 20 mg 1 with breakfast daily.  Also will give her a prescription for Robaxin for the muscle spasms.  She can alternate heat and ice.  She will follow-up in 3 weeks.  She understands not to stop the prednisone suddenly.  If she has no relief in a week she will contact us  Follow-Up Instructions: No follow-ups on file.   Ortho Exam  Patient is alert, oriented, no adenopathy, well-dressed, normal affect, normal respiratory effort. Patient is ambulating slowly secondary to pain.  No tenderness over any of the vertebral bodies in her back.  She does have soreness when  palpating her get paravertebral muscles bilaterally.  She is able to fire her dorsiflexors plantar flexors and has good resistance with a straight leg raise.  Strength is 5 out of 5 bilaterally.  No pain with extension of her back she does have some pain with flexion.  No pain when she bends side to side  Imaging: No results found. No images are attached to the encounter.  Labs: Lab Results  Component Value Date   ESRSEDRATE 31 (H) 07/01/2017   CRP 1.5 (H) 07/01/2017   REPTSTATUS 02/28/2018 FINAL 02/23/2018   CULT  02/23/2018    NO GROWTH 5 DAYS Performed at [facility], [place] [address], [place], [place] [address]      Lab Results  Component Value Date   ALBUMIN 4.0 11/20/2018   ALBUMIN 2.7 (L) 02/27/2018   ALBUMIN 3.7 02/23/2018    No results found for: MG No results found for: VD25OH  No results found for: PREALBUMIN CBC EXTENDED Latest Ref Rng & Units 11/20/2018 03/16/2018 02/27/2018  WBC 3.4 - 10.8 x10E3/uL 5.2 9.5 4.9  RBC 3.77 - 5.28 x10E6/uL 4.71 4.75 3.88  HGB 11.1 - 15.9 g/dL 14.9 14.9 12.0  HCT 34.0 - 46.6 % 43.3 45.7  36.9  PLT 150 - 450 x10E3/uL 228 242 199  NEUTROABS 1.7 - 7.7 K/uL - 5.9 -  LYMPHSABS 0.7 - 4.0 K/uL - 2.9 -     Body mass index is 38.58 kg/m.  Orders:  Orders Placed This Encounter  Procedures  . XR Lumbar Spine 2-3 Views   No orders of the defined types were placed in this encounter.    Procedures: No procedures performed  Clinical Data: No additional findings.  ROS:  All other systems negative, except as noted in the HPI. Review of Systems  Objective: Vital Signs: Ht 5\' 6"  (1.676 m)   Wt 239 lb (108.4 kg)   BMI 38.58 kg/m   Specialty Comments:  No specialty comments available.  PMFS History: Patient Active Problem List   Diagnosis Date Noted  . Positive colorectal cancer screening using Cologuard test   . Benign neoplasm of ascending colon   . Benign neoplasm of ileocecal valve   . Benign neoplasm of  transverse colon   . Benign neoplasm of sigmoid colon   . Acute diverticulitis 02/23/2018  . A-fib ([place]) 02/23/2018  . Anxiety 02/23/2018  . Urinary incontinence 02/23/2018  . S/P TKR (total knee replacement), left 07/10/2017  . Unilateral primary osteoarthritis, left knee 06/23/2017  . Chondromalacia of left patella 01/16/2017  . TOBACCO ABUSE 10/25/2008  . PALPITATIONS 10/25/2008  . DYSPNEA 10/25/2008   Past Medical History:  Diagnosis Date  . A-fib ([place])   . Anxiety   . Arthritis   . Dysrhythmia    hx of afib, afib ablation in 01/2014, no afib since then  . Fractured rib 2021  . Pneumonia    as a child  . Sigmoid diverticulitis   . Wears contact lenses   . Wears dentures   . Wears glasses     Family History  Problem Relation Age of Onset  . Diabetes Mother   . Heart disease Mother   . Colon polyps Father   . Bladder Cancer Father   . Heart disease Father   . Kidney disease Father   . Hemochromatosis Paternal Grandfather   . Lung cancer Sister   . Colon cancer Neg Hx   . Esophageal cancer Neg Hx   . Liver disease Neg Hx     Past Surgical History:  Procedure Laterality Date  . ABDOMINAL HYSTERECTOMY  1989  . BIOPSY  10/25/2019   Procedure: BIOPSY;  Surgeon: Gatha Mayer, MD;  Location: [facility];  Service: Endoscopy;;  . BLADDER SURGERY  2000  . BREAST IMPLANT REMOVAL    . CARDIAC ELECTROPHYSIOLOGY MAPPING AND ABLATION     for afib  . CESAREAN SECTION    . CHOLECYSTECTOMY    . COLONOSCOPY WITH PROPOFOL N/A 10/25/2019   Procedure: COLONOSCOPY WITH PROPOFOL- using ultra slim scope and XL abdominal binder;  Surgeon: Gatha Mayer, MD;  Location: [facility];  Service: Endoscopy;  Laterality: N/A;  need ultra slim scope and XL abdominal binder  . loop recorder explant  2015  . LOOP RECORDER IMPLANT  2015  . PLACEMENT OF BREAST IMPLANTS    . POLYPECTOMY  10/25/2019   Procedure: POLYPECTOMY;  Surgeon: Gatha Mayer, MD;  Location: [facility];  Service:  Endoscopy;;  . TONSILLECTOMY AND ADENOIDECTOMY    . TOTAL KNEE ARTHROPLASTY Left 07/10/2017   Procedure: LEFT TOTAL KNEE ARTHROPLASTY;  Surgeon: Leandrew Koyanagi, MD;  Location: [facility];  Service: Orthopedics;  Laterality: Left;   Social History   Occupational History  .  Occupation: Glass blower/designer  Tobacco Use  . Smoking status: Former Smoker    Packs/day: 0.25    Types: Cigarettes    Quit date: 06/27/2019    Years since quitting: 0.6  . Smokeless tobacco: Never Used  . Tobacco comment: d/c as of 08/2019  Vaping Use  . Vaping Use: Never used  Substance and Sexual Activity  . Alcohol use: No  . Drug use: No  . Sexual activity: Yes    Birth control/protection: Surgical

## 2020-03-09 ENCOUNTER — Other Ambulatory Visit: Payer: Self-pay | Admitting: Cardiology

## 2020-03-09 DIAGNOSIS — I4891 Unspecified atrial fibrillation: Secondary | ICD-10-CM

## 2020-03-25 ENCOUNTER — Other Ambulatory Visit: Payer: Self-pay | Admitting: Physician Assistant

## 2020-03-26 ENCOUNTER — Other Ambulatory Visit: Payer: Self-pay | Admitting: Cardiology

## 2020-06-02 ENCOUNTER — Other Ambulatory Visit: Payer: Self-pay | Admitting: Internal Medicine

## 2020-06-02 DIAGNOSIS — Z1231 Encounter for screening mammogram for malignant neoplasm of breast: Secondary | ICD-10-CM

## 2020-06-06 ENCOUNTER — Other Ambulatory Visit: Payer: Self-pay | Admitting: Cardiology

## 2020-06-06 DIAGNOSIS — I4891 Unspecified atrial fibrillation: Secondary | ICD-10-CM

## 2020-06-26 ENCOUNTER — Ambulatory Visit: Payer: 59

## 2020-06-28 ENCOUNTER — Ambulatory Visit: Payer: 59

## 2020-07-11 ENCOUNTER — Other Ambulatory Visit: Payer: Self-pay | Admitting: Cardiology

## 2020-07-11 NOTE — Telephone Encounter (Signed)
Refill request

## 2020-08-07 ENCOUNTER — Ambulatory Visit
Admission: RE | Admit: 2020-08-07 | Discharge: 2020-08-07 | Disposition: A | Discharge status: Home / Self Care | Payer: 59 | Source: Ambulatory Visit | Attending: Internal Medicine | Admitting: Internal Medicine

## 2020-08-07 ENCOUNTER — Other Ambulatory Visit: Payer: Self-pay

## 2020-08-07 DIAGNOSIS — Z1231 Encounter for screening mammogram for malignant neoplasm of breast: Secondary | ICD-10-CM

## 2020-08-30 ENCOUNTER — Other Ambulatory Visit: Payer: Self-pay | Admitting: Cardiology

## 2020-08-30 ENCOUNTER — Other Ambulatory Visit: Payer: Self-pay | Admitting: Physician Assistant

## 2020-08-30 DIAGNOSIS — I4891 Unspecified atrial fibrillation: Secondary | ICD-10-CM

## 2020-11-23 ENCOUNTER — Other Ambulatory Visit: Payer: Self-pay | Admitting: Cardiology

## 2020-11-23 DIAGNOSIS — I4891 Unspecified atrial fibrillation: Secondary | ICD-10-CM

## 2021-02-18 ENCOUNTER — Other Ambulatory Visit: Payer: Self-pay | Admitting: Cardiology

## 2021-02-18 DIAGNOSIS — I4891 Unspecified atrial fibrillation: Secondary | ICD-10-CM

## 2021-02-19 ENCOUNTER — Telehealth: Payer: Self-pay

## 2021-02-19 NOTE — Telephone Encounter (Signed)
I sent her my chart message. Max dose of Flecainide 300 mg daily if extra dose is needed. Keep me posted.

## 2021-02-19 NOTE — Telephone Encounter (Signed)
Patient called and stated that she has been in Afib for 2 days and can't get out, and thinks it comes from after taking her antibiotic medication NITROFURANTOIN 50mg , recently prescribed for a bladder infection. She is asking, would taking this medication cause her to be in Afib. She has already taken her 2nd Flecainide of the day and knows she cannot take another one today and is concerned. Please advise.  Patient stated she is ok with getting a response through [facility].

## 2021-02-20 NOTE — Telephone Encounter (Signed)
I called patient, she feeling better now after taking that extra dose of Flecainide, but will call back if she has any other issues.

## 2021-02-22 ENCOUNTER — Ambulatory Visit (HOSPITAL_COMMUNITY)
Admission: EM | Admit: 2021-02-22 | Discharge: 2021-02-22 | Disposition: A | Discharge status: Home / Self Care | Payer: 59 | Attending: Internal Medicine | Admitting: Internal Medicine

## 2021-02-22 ENCOUNTER — Other Ambulatory Visit: Payer: Self-pay

## 2021-02-22 ENCOUNTER — Emergency Department (HOSPITAL_COMMUNITY)
Admission: EM | Admit: 2021-02-22 | Discharge: 2021-02-22 | Disposition: A | Discharge status: Home / Self Care | Payer: 59 | Attending: Emergency Medicine | Admitting: Emergency Medicine

## 2021-02-22 ENCOUNTER — Encounter (HOSPITAL_COMMUNITY): Payer: Self-pay

## 2021-02-22 ENCOUNTER — Emergency Department (HOSPITAL_COMMUNITY): Payer: 59

## 2021-02-22 DIAGNOSIS — Z20822 Contact with and (suspected) exposure to covid-19: Secondary | ICD-10-CM | POA: Insufficient documentation

## 2021-02-22 DIAGNOSIS — Z7901 Long term (current) use of anticoagulants: Secondary | ICD-10-CM | POA: Insufficient documentation

## 2021-02-22 DIAGNOSIS — R059 Cough, unspecified: Secondary | ICD-10-CM | POA: Diagnosis not present

## 2021-02-22 DIAGNOSIS — R600 Localized edema: Secondary | ICD-10-CM | POA: Insufficient documentation

## 2021-02-22 DIAGNOSIS — Z85038 Personal history of other malignant neoplasm of large intestine: Secondary | ICD-10-CM | POA: Diagnosis not present

## 2021-02-22 DIAGNOSIS — I4891 Unspecified atrial fibrillation: Secondary | ICD-10-CM | POA: Insufficient documentation

## 2021-02-22 DIAGNOSIS — R0600 Dyspnea, unspecified: Secondary | ICD-10-CM

## 2021-02-22 DIAGNOSIS — R0602 Shortness of breath: Secondary | ICD-10-CM | POA: Diagnosis present

## 2021-02-22 DIAGNOSIS — Z96652 Presence of left artificial knee joint: Secondary | ICD-10-CM | POA: Diagnosis not present

## 2021-02-22 DIAGNOSIS — R1909 Other intra-abdominal and pelvic swelling, mass and lump: Secondary | ICD-10-CM | POA: Diagnosis not present

## 2021-02-22 DIAGNOSIS — Z87891 Personal history of nicotine dependence: Secondary | ICD-10-CM | POA: Insufficient documentation

## 2021-02-22 DIAGNOSIS — R079 Chest pain, unspecified: Secondary | ICD-10-CM

## 2021-02-22 LAB — TROPONIN I (HIGH SENSITIVITY)
Troponin I (High Sensitivity): 3 ng/L (ref <18)
Troponin I (High Sensitivity): 3 ng/L (ref <18)

## 2021-02-22 LAB — CBC
HCT: 46.3 % — ABNORMAL HIGH (ref 36.0–46.0)
Hemoglobin: 15 g/dL (ref 12.0–15.0)
MCH: 31.2 pg (ref 26.0–34.0)
MCHC: 32.4 g/dL (ref 30.0–36.0)
MCV: 96.3 fL (ref 80.0–100.0)
Platelets: 229 10*3/uL (ref 150–400)
RBC: 4.81 MIL/uL (ref 3.87–5.11)
RDW: 12.5 % (ref 11.5–15.5)
WBC: 7.1 10*3/uL (ref 4.0–10.5)
nRBC: 0 % (ref 0.0–0.2)

## 2021-02-22 LAB — BASIC METABOLIC PANEL
Anion gap: 7 (ref 5–15)
BUN: 8 mg/dL (ref 8–23)
CO2: 25 mmol/L (ref 22–32)
Calcium: 8.9 mg/dL (ref 8.9–10.3)
Chloride: 104 mmol/L (ref 98–111)
Creatinine, Ser: 0.72 mg/dL (ref 0.44–1.00)
GFR, Estimated: >60 mL/min (ref >60)
Glucose, Bld: 94 mg/dL (ref 70–99)
Potassium: 3.7 mmol/L (ref 3.5–5.1)
Sodium: 136 mmol/L (ref 135–145)

## 2021-02-22 LAB — RESP PANEL BY RT-PCR (FLU A&B, COVID) ARPGX2
Influenza A by PCR: NEGATIVE
Influenza B by PCR: NEGATIVE
SARS Coronavirus 2 by RT PCR: NEGATIVE

## 2021-02-22 LAB — BRAIN NATRIURETIC PEPTIDE: B Natriuretic Peptide: 42.1 pg/mL (ref 0.0–100.0)

## 2021-02-22 LAB — D-DIMER, QUANTITATIVE: D-Dimer, Quant: <0.27 ug/mL-FEU (ref 0.00–0.50)

## 2021-02-22 MED ORDER — FUROSEMIDE 20 MG PO TABS
ORAL_TABLET | ORAL | 0 refills | Status: DC
Start: 2021-02-22 — End: 2022-01-28

## 2021-02-22 MED ORDER — FUROSEMIDE 10 MG/ML IJ SOLN
20.0000 mg | Freq: Once | INTRAMUSCULAR | Status: AC
  Administered 2021-02-22: 20 mg via INTRAVENOUS
  Filled 2021-02-22: qty 2

## 2021-02-22 NOTE — ED Triage Notes (Signed)
Pt c/o SHOB, "feeling smothered" x4 days. Hx afib, ablation in 2015, compliant w medications. Familial hx of CHF Multiple at-home covid tests negative Hx UTI, still on abx, compliant.  8/10 "squeezes, stops."

## 2021-02-22 NOTE — Discharge Instructions (Addendum)
Return if any problems.

## 2021-02-22 NOTE — ED Triage Notes (Signed)
Pt presents with SOB, cough X 4 days. Pt states she is unable to take a deep breath.   States she took COVID test at home that resulted negative.   Pt states she has swelling under her chest and c/o elevated HR. She states her HR slows down and then is elevated. Denies v/d, presents with nausea.   States she is not in pain and expresses being unable to breathe.

## 2021-02-22 NOTE — ED Provider Notes (Signed)
Emergency Medicine Provider Triage Evaluation Note  [name] , a 60 y.o. female  was evaluated in triage.  Pt complains of chest tightness and shortness of breath.  Does have been present since Monday night after she took an extra dose of flecainide due to being in atrial fibrillation.  And has had this chest tightness and shortness of breath intermittently since then.  No alleviating or aggravating factors.  Pain is located to the left side of her chest.  Does not radiate anywhere.  Patient endorses nonproductive cough.  No leg swelling, nausea, vomiting, abdominal pain, hormone therapy, hemoptysis, recent surgery, history of DVT or PE.  Review of Systems  Positive: Chest tightness, shortness of breath,  Negative: Nausea, vomiting, diaphoresis, nasal congestion, rhinorrhea, sore throat, hemoptysis, leg swelling,  Physical Exam  BP 112/78 (BP Location: Left Arm)   Pulse 86   Temp 98.9 F (37.2 C) (Oral)   Resp 18   SpO2 96%  Gen:   Awake, no distress   Resp:  Normal effort, lungs clear to auscultation bilaterally.  Patient able to speak in full complete sentences MSK:   Moves extremities without difficulty  Other:  No swelling or edema to bilateral lower extremities.  Patient does complain of tenderness to bilateral calves.  Medical Decision Making  Medically screening exam initiated at 4:56 PM.  Appropriate orders placed.  Krishauna Schatzman was informed that the remainder of the evaluation will be completed by another provider, this initial triage assessment does not replace that evaluation, and the importance of remaining in the ED until their evaluation is complete.  The patient appears stable so that the remainder of the work up may be completed by another provider.      Loni Beckwith, PA-C 02/22/21 [place], [place], DO 02/22/21 2209

## 2021-02-22 NOTE — ED Triage Notes (Signed)
Patient is being discharged from the Urgent Care and sent to the Emergency Department via POV . Per Jaynee Eagles and Peri Jefferson PA, patient is in need of higher level of care due to SOB, chest pain and swelling under chest. Patient is aware and verbalizes understanding of plan of care.  Vitals:   02/22/21 1533  BP: 109/68  Pulse: 87  Resp: 17  Temp: 98.4 F (36.9 C)  SpO2: 98%

## 2021-02-22 NOTE — ED Provider Notes (Signed)
[facility] EMERGENCY DEPARTMENT Provider Note   CSN: [id number] Arrival date & time: 02/22/21  1555     History Chief Complaint  Patient presents with   Shortness of Breath    Tyshika Baldridge is a 60 y.o. female.  The history is provided by the patient. No language interpreter was used.  Shortness of Breath Severity:  Moderate Onset quality:  Gradual Duration:  4 days Timing:  Constant Progression:  Unchanged Chronicity:  New Context: not URI   Relieved by:  Nothing Worsened by:  Nothing Ineffective treatments:  None tried Associated symptoms: cough   Associated symptoms: no fever and no rash   Risk factors: no recent alcohol use   Pt complains of shortness of breath, swelling in upper abdomen and in her legs.    Pt reports she has a history of afib and was in afib on Monday.  Pt reports it resolved with 300mg  of flecainide.  Pt reports swelling since    Past Medical History:  Diagnosis Date   A-fib ([place])    Anxiety    Arthritis    Dysrhythmia    hx of afib, afib ablation in 01/2014, no afib since then   Fractured rib 2021   Pneumonia    as a child   Sigmoid diverticulitis    Wears contact lenses    Wears dentures    Wears glasses     Patient Active Problem List   Diagnosis Date Noted   Positive colorectal cancer screening using Cologuard test    Benign neoplasm of ascending colon    Benign neoplasm of ileocecal valve    Benign neoplasm of transverse colon    Benign neoplasm of sigmoid colon    Acute diverticulitis 02/23/2018   A-fib ([place]) 02/23/2018   Anxiety 02/23/2018   Urinary incontinence 02/23/2018   S/P TKR (total knee replacement), left 07/10/2017   Unilateral primary osteoarthritis, left knee 06/23/2017   Chondromalacia of left patella 01/16/2017   TOBACCO ABUSE 10/25/2008   PALPITATIONS 10/25/2008   DYSPNEA 10/25/2008    Past Surgical History:  Procedure Laterality Date   ABDOMINAL HYSTERECTOMY  1989   BIOPSY  10/25/2019    Procedure: BIOPSY;  Surgeon: Gatha Mayer, MD;  Location: [facility];  Service: Endoscopy;;   BLADDER SURGERY  2000   BREAST IMPLANT REMOVAL     CARDIAC ELECTROPHYSIOLOGY MAPPING AND ABLATION     for afib   CESAREAN SECTION     CHOLECYSTECTOMY     COLONOSCOPY WITH PROPOFOL N/A 10/25/2019   Procedure: COLONOSCOPY WITH PROPOFOL- using ultra slim scope and XL abdominal binder;  Surgeon: Gatha Mayer, MD;  Location: [facility];  Service: Endoscopy;  Laterality: N/A;  need ultra slim scope and XL abdominal binder   loop recorder explant  2015   LOOP RECORDER IMPLANT  2015   PLACEMENT OF BREAST IMPLANTS     POLYPECTOMY  10/25/2019   Procedure: POLYPECTOMY;  Surgeon: Gatha Mayer, MD;  Location: [facility];  Service: Endoscopy;;   TONSILLECTOMY AND ADENOIDECTOMY     TOTAL KNEE ARTHROPLASTY Left 07/10/2017   Procedure: LEFT TOTAL KNEE ARTHROPLASTY;  Surgeon: Leandrew Koyanagi, MD;  Location: [facility];  Service: Orthopedics;  Laterality: Left;     OB History   No obstetric history on file.     Family History  Problem Relation Age of Onset   Diabetes Mother    Heart disease Mother    Colon polyps Father    Bladder Cancer  Father    Heart disease Father    Kidney disease Father    Hemochromatosis Paternal Grandfather    Lung cancer Sister    Colon cancer Neg Hx    Esophageal cancer Neg Hx    Liver disease Neg Hx     Social History   Tobacco Use   Smoking status: Former    Packs/day: 0.25    Pack years: 0.00    Types: Cigarettes    Quit date: 06/27/2019    Years since quitting: 1.6   Smokeless tobacco: Never   Tobacco comments:    d/c as of 08/2019  Vaping Use   Vaping Use: Never used  Substance Use Topics   Alcohol use: No   Drug use: No    Home Medications Prior to Admission medications   Medication Sig Start Date End Date Taking? Authorizing Provider  ALPRAZolam Duanne Moron) 0.5 MG tablet Take 0.5 mg by mouth 2 (two) times daily as needed for anxiety. 09/22/19    [provider]  atorvastatin (LIPITOR) 10 MG tablet Take 1 tablet (10 mg total) by mouth daily. 12/23/19 12/17/20  Adrian Prows, MD  flecainide (TAMBOCOR) 100 MG tablet TAKE 1 TABLET BY MOUTH TWICE A DAY 07/11/20   Adrian Prows, MD  HYDROcodone-acetaminophen (NORCO/VICODIN) 5-325 MG tablet Take 1-2 tablets by mouth every 6 (six) hours as needed for severe pain. 12/20/19   Wieters, Hallie C, PA-C  methocarbamol (ROBAXIN) 500 MG tablet Take 1 tablet (500 mg total) by mouth every 6 (six) hours as needed for muscle spasms. 03/03/20   Persons, Bevely Palmer, PA  metoprolol tartrate (LOPRESSOR) 25 MG tablet TAKE 1 TABLET BY MOUTH TWICE A DAY 03/27/20   Adrian Prows, MD  metoprolol tartrate (LOPRESSOR) 50 MG tablet Take 50 mg by mouth 2 (two) times daily.    [provider]  predniSONE (DELTASONE) 10 MG tablet TAKE 2 TABLETS BY MOUTH EVERY DAY WITH BREAKFAST 03/27/20   Persons, Bevely Palmer, PA  tolterodine (DETROL LA) 4 MG 24 hr capsule Take 4 mg by mouth daily.    [provider]  XARELTO 20 MG TABS tablet TAKE 1 TABLET BY MOUTH DAILY WITH SUPPER 02/19/21   Adrian Prows, MD    Allergies    Patient has no known allergies.  Review of Systems   Review of Systems  Constitutional:  Negative for fever.  Respiratory:  Positive for cough and shortness of breath.   Cardiovascular:  Negative for palpitations.  Genitourinary:  Negative for dysuria.  Skin:  Negative for rash.  Neurological:  Negative for syncope.  All other systems reviewed and are negative.  Physical Exam Updated Vital Signs BP (!) 93/59   Pulse 74   Temp 98.5 F (36.9 C) (Oral)   Resp (!) 21   SpO2 96%   Physical Exam Vitals reviewed.  Constitutional:      Appearance: She is well-developed.  Cardiovascular:     Rate and Rhythm: Normal rate and regular rhythm.  Pulmonary:     Effort: Pulmonary effort is normal.     Breath sounds: Normal breath sounds. No decreased breath sounds.  Chest:     Chest wall: No tenderness.   Abdominal:     General: Bowel sounds are normal.     Palpations: Abdomen is soft.     Comments: Multiple small red areas upper abdomen    Musculoskeletal:     Cervical back: Normal range of motion.     Right lower leg: Edema present.  Left lower leg: Edema present.  Skin:    General: Skin is warm.  Neurological:     General: No focal deficit present.     Mental Status: She is alert.  Psychiatric:        Mood and Affect: Mood normal.    ED Results / Procedures / Treatments   Labs (all labs ordered are listed, but only abnormal results are displayed) Labs Reviewed  CBC - Abnormal; Notable for the following components:      Result Value   HCT 46.3 (*)    All other components within normal limits  RESP PANEL BY RT-PCR (FLU A&B, COVID) ARPGX2  BASIC METABOLIC PANEL  BRAIN NATRIURETIC PEPTIDE  D-DIMER, QUANTITATIVE  TROPONIN I (HIGH SENSITIVITY)  TROPONIN I (HIGH SENSITIVITY)    EKG EKG Interpretation  Date/Time:  Thursday February 22 2021 16:08:03 EDT Ventricular Rate:  91 PR Interval:  166 QRS Duration: 76 QT Interval:  382 QTC Calculation: 469 R Axis:   33 Text Interpretation: Normal sinus rhythm Normal ECG Confirmed by Davonna Belling [phone]) on 02/22/2021 10:38:44 PM  Radiology DG Chest 2 View  Result Date: 02/22/2021 CLINICAL DATA:  Shortness of breath. EXAM: CHEST - 2 VIEW COMPARISON:  December 20, 2019 FINDINGS: The lungs are hyperinflated. Mild to moderate severity diffuse, chronic appearing increased interstitial lung markings are seen. Mild atelectasis is noted within the left lung base. There is no evidence of a pleural effusion or pneumothorax. The heart size and mediastinal contours are within normal limits. The visualized skeletal structures are unremarkable. IMPRESSION: Chronic appearing increased interstitial lung markings with mild left basilar atelectasis. Electronically Signed   By: Virgina Norfolk M.D.   On: 02/22/2021 16:57    Procedures Procedures    Medications Ordered in ED Medications  furosemide (LASIX) injection 20 mg (20 mg Intravenous Given 02/22/21 2209)    ED Course  I have reviewed the triage vital signs and the nursing notes.  Pertinent labs & imaging results that were available during my care of the patient were reviewed by me and considered in my medical decision making (see chart for details).    MDM Rules/Calculators/A&P                          MDM:  Ekg  no acute findings,  Chest xray shows markings of chronic lung disease Troponin is negative x 2,  ddimer is normal  Covid is negative.  Pt given IV lasix 20 mg.   I suspect pt has edema due to episode of afib  I will start pt on low dose of lasix.  Pt advised to follow up at Dr. Irven Shelling office as scheduled  Final Clinical Impression(s) / ED Diagnoses Final diagnoses:  Bilateral lower extremity edema    Rx / DC Orders ED Discharge Orders          Ordered    furosemide (LASIX) 20 MG tablet        02/22/21 2333          An After Visit Summary was printed and given to the patient.    Sidney Ace 02/22/21 2334    Davonna Belling, MD 02/23/21 [phone]

## 2021-02-23 ENCOUNTER — Ambulatory Visit: Payer: 59 | Admitting: Student

## 2021-02-23 NOTE — Progress Notes (Deleted)
Primary Physician/Referring:  Velna Hatchet, MD  Patient ID: [name], female    DOB: Jan 03, 1961, 60 y.o.   MRN: [id number]  No chief complaint on file.  HPI:    [name]  is a 60 y.o. female with paroxysmal A. Fib diagnosed in  2015 and tried several medications that she did not help. She then underwent successful ablation; however, in Jan 2019 noted to have recurrent A fib with RVR on event monitor and is now on flecainide.  Patient was seen in the Urgent Care on 12/20/2019 for a broken rib and leg edema due to an accidental fall.  States that since then she has had occasional brief atrial fibrillation, feels well.  Past Medical History:  Diagnosis Date   A-fib [facility])    Anxiety    Arthritis    Dysrhythmia    hx of afib, afib ablation in 01/2014, no afib since then   Fractured rib 2021   Pneumonia    as a child   Sigmoid diverticulitis    Wears contact lenses    Wears dentures    Wears glasses    Past Surgical History:  Procedure Laterality Date   ABDOMINAL HYSTERECTOMY  1989   BIOPSY  10/25/2019   Procedure: BIOPSY;  Surgeon: Gatha Mayer, MD;  Location: [facility];  Service: Endoscopy;;   BLADDER SURGERY  2000   BREAST IMPLANT REMOVAL     CARDIAC ELECTROPHYSIOLOGY MAPPING AND ABLATION     for afib   CESAREAN SECTION     CHOLECYSTECTOMY     COLONOSCOPY WITH PROPOFOL N/A 10/25/2019   Procedure: COLONOSCOPY WITH PROPOFOL- using ultra slim scope and XL abdominal binder;  Surgeon: Gatha Mayer, MD;  [facility]: [facility];  Service: Endoscopy;  Laterality: N/A;  need ultra slim scope and XL abdominal binder   loop recorder explant  2015   LOOP RECORDER IMPLANT  2015   PLACEMENT OF BREAST IMPLANTS     POLYPECTOMY  10/25/2019   Procedure: POLYPECTOMY;  Surgeon: Gatha Mayer, MD;  [facility];  Service: Endoscopy;;   TONSILLECTOMY AND ADENOIDECTOMY     TOTAL KNEE ARTHROPLASTY Left 07/10/2017   Procedure: LEFT TOTAL KNEE ARTHROPLASTY;  Surgeon:  Leandrew Koyanagi, MD;  Location: [place];  Service: Orthopedics;  Laterality: Left;   Family History  Problem Relation Age of Onset   Diabetes Mother    Heart disease Mother    Colon polyps Father    Bladder Cancer Father    Heart disease Father    Kidney disease Father    Hemochromatosis Paternal Grandfather    Lung cancer Sister    Colon cancer Neg Hx    Esophageal cancer Neg Hx    Liver disease Neg Hx     Social History   Tobacco Use   Smoking status: Former    Packs/day: 0.25    Pack years: 0.00    Types: Cigarettes    Quit date: 06/27/2019    Years since quitting: 1.6   Smokeless tobacco: Never   Tobacco comments:    d/c as of 08/2019  Substance Use Topics   Alcohol use: No   Marital Status: Divorced  ROS  Review of Systems  Cardiovascular:  Positive for chest pain (left rib fracture). Negative for dyspnea on exertion and leg swelling.  Musculoskeletal:  Positive for joint pain (left ankle after a fall).  Gastrointestinal:  Negative for melena.  Objective  There were no vitals taken for this visit.  Vitals with BMI 02/22/2021 02/22/2021 02/22/2021  Height - - -  Weight - - -  BMI - - -  Systolic 062 93 93  Diastolic 69 59 61  Pulse 87 74 74     Physical Exam Constitutional:      Comments: Moderately obese  Cardiovascular:     Rate and Rhythm: Normal rate and regular rhythm.     Pulses: Intact distal pulses.     Heart sounds: Normal heart sounds. No murmur heard.   No gallop.     Comments: No leg edema, no JVD. Pulmonary:     Effort: Pulmonary effort is normal.     Breath sounds: Normal breath sounds.  Abdominal:     General: Bowel sounds are normal.     Palpations: Abdomen is soft.   Laboratory examination:   Recent Labs    02/22/21 1612  NA 136  K 3.7  CL 104  CO2 25  GLUCOSE 94  BUN 8  CREATININE 0.72  CALCIUM 8.9  GFRNONAA >60   CrCl cannot be calculated (Unknown ideal weight.).  CMP Latest Ref Rng & Units 02/22/2021 11/20/2018 02/27/2018   Glucose 70 - 99 mg/dL 94 82 104(H)  BUN 8 - 23 mg/dL 8 10 <5(L)  Creatinine 0.44 - 1.00 mg/dL 0.72 0.79 0.67  Sodium 135 - 145 mmol/L 136 143 143  Potassium 3.5 - 5.1 mmol/L 3.7 4.1 3.6  Chloride 98 - 111 mmol/L 104 106 112(H)  CO2 22 - 32 mmol/L 25 24 26   Calcium 8.9 - 10.3 mg/dL 8.9 8.9 8.2(L)  Total Protein 6.0 - 8.5 g/dL - 6.6 6.0(L)  Total Bilirubin 0.0 - 1.2 mg/dL - 0.3 0.3  Alkaline Phos 39 - 117 IU/L - 139(H) 123  AST 0 - 40 IU/L - 19 21  ALT 0 - 32 IU/L - 16 22   CBC Latest Ref Rng & Units 02/22/2021 11/20/2018 03/16/2018  WBC 4.0 - 10.5 K/uL 7.1 5.2 9.5  Hemoglobin 12.0 - 15.0 g/dL 15.0 14.9 14.9  Hematocrit 36.0 - 46.0 % 46.3(H) 43.3 45.7  Platelets 150 - 400 K/uL 229 228 242   Lipid Panel     Component Value Date/Time   CHOL 167 11/20/2018 0736   TRIG 81 11/20/2018 0736   HDL 43 11/20/2018 0736   LDLCALC 108 (H) 11/20/2018 0736   HEMOGLOBIN A1C No results found for: HGBA1C, MPG TSH No results for input(s): TSH in the last 8760 hours.  External labs:   11/20/18: TSH 2.000.   Medications and allergies  No Known Allergies   Current Outpatient Medications  Medication Instructions   ALPRAZolam (XANAX) 0.5 mg, Oral, 2 times daily PRN   atorvastatin (LIPITOR) 10 mg, Oral, Daily   flecainide (TAMBOCOR) 100 MG tablet TAKE 1 TABLET BY MOUTH TWICE A DAY   furosemide (LASIX) 20 MG tablet One tablet a day as needed for swelling   HYDROcodone-acetaminophen (NORCO/VICODIN) 5-325 MG tablet 1-2 tablets, Oral, Every 6 hours PRN   methocarbamol (ROBAXIN) 500 mg, Oral, Every 6 hours PRN   metoprolol tartrate (LOPRESSOR) 25 MG tablet TAKE 1 TABLET BY MOUTH TWICE A DAY   metoprolol tartrate (LOPRESSOR) 50 mg, Oral, 2 times daily   predniSONE (DELTASONE) 10 MG tablet TAKE 2 TABLETS BY MOUTH EVERY DAY WITH BREAKFAST   tolterodine (DETROL LA) 4 mg, Oral, Daily   XARELTO 20 MG TABS tablet TAKE 1 TABLET BY MOUTH DAILY WITH SUPPER   Radiology:   CT chest 12/17/2018: No acute  abnormality is  noted in the chest.  Coronary artery calcifications are noted suggesting coronary artery disease. Aortic Atherosclerosis   Cardiac Studies:   Nuclear stress test  [2015]: Normal.  A. Fib Ablation 2015 at [facility].  Event Monitor 30 days 08/28/2017: Paroxysmal sustained A. Fib with RVR - Symptomatic with palpitations. Latest episode was on 09/02/2017.  Echocardiogram 09/02/2017: Left ventricle cavity is normal in size. Moderate concentric hypertrophy of the left ventricle. Low normal LV systolic function. Normal diastolic filling pattern, normal LAP. Left ventricle regional wall motion findings: No wall motion abnormalities. Visual EF is 50-55%. Calculated EF 48%.  Mild tricuspid regurgitation. No evidence of pulmonary hypertension. Patient in sinus rhythm during exam.  Vascular US Lower Extremity Venous Reflux 07/02/2018: Left: Abnormal reflux times were noted in the common femoral vein, great saphenous vein at the saphenofemoral junction, and great saphenous vein at the knee. There is no evidence of deep vein thrombosis in the lower extremity.There is no evidence of superficial venous thrombosis.  EKG    EKG 12/23/2019: Normal sinus rhythm at rate of 63 bpm, normal axis.  Nonspecific T abnormality in V1 to V3, probably normal variant but cannot exclude ischemia.  Normal QT interval.  Low-voltage complexes.      Assessment   No diagnosis found.   No orders of the defined types were placed in this encounter.   There are no discontinued medications.  Recommendations:   [name]  is a  60 y.o. female with paroxysmal A. Fib diagnosed in  2015 and tried several medications that she did not help. She then underwent successful ablation; however, in Jan 2019 noted to have recurrent A fib with RVR on event monitor and is now on flecainide.  Since being on flecainide, she is feeling the best she has with regard to palpitations, with occasional episodes of  breakthrough.  She is presently on Eliquis, I discussed with her that her cardioembolic risk is 1.0 and she could certainly discontinue this and be on aspirin 81 mg daily.  I reviewed her CT scan report from the emergency room and also advised her regarding coronary calcification.  In view of this, although her LDL is only minimally elevated, I started her on Lipitor 10 mg daily, she will follow up with PCP in the near future for labs.  In view of coronary calcification, I also discussed with her regarding weight loss.  Fortunately she has quit smoking and has remained abstinent.  For weight loss, she could try Wellbutrin, patient has medications at home she will give it a second shot, previously did not like how it made her feel when she was trying to quit smoking.  Toni Berthold, MD, [facility] 02/23/2021, 8:53 AM [facility] [place] Office: [phone]

## 2021-03-22 ENCOUNTER — Other Ambulatory Visit: Payer: Self-pay | Admitting: Cardiology

## 2021-03-22 DIAGNOSIS — I4891 Unspecified atrial fibrillation: Secondary | ICD-10-CM

## 2021-05-20 ENCOUNTER — Other Ambulatory Visit: Payer: Self-pay | Admitting: Cardiology

## 2021-05-20 DIAGNOSIS — I4891 Unspecified atrial fibrillation: Secondary | ICD-10-CM

## 2021-06-14 ENCOUNTER — Telehealth: Payer: Self-pay | Admitting: Cardiology

## 2021-06-14 DIAGNOSIS — I480 Paroxysmal atrial fibrillation: Secondary | ICD-10-CM

## 2021-06-14 MED ORDER — FLECAINIDE ACETATE 50 MG PO TABS: 50.0000 mg | ORAL_TABLET | Freq: Two times a day (BID) | ORAL | 3 refills | Status: DC

## 2021-06-14 NOTE — Telephone Encounter (Signed)
Chief Complaint  Patient presents with   Medication Management    Myrbetriq prescription from urologist     ICD-10-CM   1. Paroxysmal atrial fibrillation (HCC)  I48.0 flecainide (TAMBOCOR) 50 MG tablet     Meds ordered this encounter  Medications   flecainide (TAMBOCOR) 50 MG tablet    Sig: Take 1 tablet (50 mg total) by mouth 2 (two) times daily.    Dispense:  180 tablet    Refill:  3    Medications Discontinued During This Encounter  Medication Reason   flecainide (TAMBOCOR) 100 MG tablet      Adrian Prows, MD, [facility] 06/14/2021, 8:09 PM Office: [phone] Fax: [phone] Pager: [phone]

## 2021-06-14 NOTE — Telephone Encounter (Signed)
Patient says urologist prescribed myrbetriq and said it sometimes can interact with flecainide. They told her to confirm with our office it's ok for her to take both of these medications. Call her back at [phone].

## 2021-06-15 NOTE — Telephone Encounter (Signed)
Spoke with patient, call transferred to front desk staff ([phone]) to schedule appointment.

## 2021-06-20 ENCOUNTER — Other Ambulatory Visit: Payer: Self-pay | Admitting: Cardiology

## 2021-07-02 ENCOUNTER — Ambulatory Visit: Payer: 59 | Admitting: Student

## 2021-07-02 ENCOUNTER — Encounter: Payer: Self-pay | Admitting: Student

## 2021-07-02 ENCOUNTER — Other Ambulatory Visit: Payer: Self-pay

## 2021-07-02 VITALS — BP 92/54 | HR 79 | Temp 97.8°F | Resp 16 | Ht 66.0 in | Wt 234.6 lb

## 2021-07-02 DIAGNOSIS — I2510 Atherosclerotic heart disease of native coronary artery without angina pectoris: Secondary | ICD-10-CM

## 2021-07-02 DIAGNOSIS — I480 Paroxysmal atrial fibrillation: Secondary | ICD-10-CM

## 2021-07-02 MED ORDER — METOPROLOL TARTRATE 25 MG PO TABS: 25.0000 mg | ORAL_TABLET | Freq: Two times a day (BID) | ORAL | 3 refills | Status: DC

## 2021-07-02 NOTE — Progress Notes (Signed)
Primary Physician/Referring:  Velna Hatchet, MD  Patient ID: [name], female    DOB: 1960-10-21, 60 y.o.   MRN: [id number]  Chief Complaint  Patient presents with   Atrial Fibrillation   Follow-up    2 week   HPI:    [name]  is a 60 y.o. female with paroxysmal A. Fib diagnosed in  2015 and tried several medications that she did not help. She then underwent successful ablation; however, in Jan 2019 noted to have recurrent A fib with RVR on event monitor and is now on flecainide.  Patient was last seen in our office 11/2019 at which time she was stable and advised to follow-up in 1 year, unfortunately patient was lost to follow-up until now.  She recently called the office stating she has been started on Myrbetriq by urology, therefore was advised to reduce flecainide to half tablet (50 mg) twice daily and come in for follow-up and therapeutic drug monitoring.  Patient now presents for follow-up of paroxysmal atrial fibrillation following reducing her flecainide dose.   Patient has had no recurrence of atrial fibrillation, states she knows when she is not in A. fib due to symptoms of lightheadedness and palpitations.  Denies palpitations, dyspnea, dizziness, chest pain.  Denies orthopnea, PND, leg edema.  She continues to tolerate anticoagulation without bleeding diathesis.  Although her last visit Dr. Einar Gip advised patient that she may discontinue Xarelto and instead take aspirin 81 mg daily given low cardioembolic risk.  Patient prefers to continue with anticoagulation despite knowledging the associated risks.   Patient does admit to medication noncompliance stating she has only been taking medications once daily including both flecainide and metoprolol.  She has been taking flecainide 100 mg once daily.  Past Medical History:  Diagnosis Date   A-fib [facility])    Anxiety    Arthritis    Dysrhythmia    hx of afib, afib ablation in 01/2014, no afib since then   Fractured rib 2021    Hyperlipidemia    Hypertension    Pneumonia    as a child   Sigmoid diverticulitis    Wears contact lenses    Wears dentures    Wears glasses    Past Surgical History:  Procedure Laterality Date   ABDOMINAL HYSTERECTOMY  1989   BIOPSY  10/25/2019   Procedure: BIOPSY;  Surgeon: Gatha Mayer, MD;  [facility];  Service: Endoscopy;;   BLADDER SURGERY  2000   BREAST IMPLANT REMOVAL     CARDIAC ELECTROPHYSIOLOGY MAPPING AND ABLATION     for afib   CESAREAN SECTION     CHOLECYSTECTOMY     COLONOSCOPY WITH PROPOFOL N/A 10/25/2019   Procedure: COLONOSCOPY WITH PROPOFOL- using ultra slim scope and XL abdominal binder;  Surgeon: Gatha Mayer, MD;  [facility];  Service: Endoscopy;  Laterality: N/A;  need ultra slim scope and XL abdominal binder   loop recorder explant  2015   LOOP RECORDER IMPLANT  2015   PLACEMENT OF BREAST IMPLANTS     POLYPECTOMY  10/25/2019   Procedure: POLYPECTOMY;  Surgeon: Gatha Mayer, MD;  [facility];  Service: Endoscopy;;   TONSILLECTOMY AND ADENOIDECTOMY     TOTAL KNEE ARTHROPLASTY Left 07/10/2017   Procedure: LEFT TOTAL KNEE ARTHROPLASTY;  Surgeon: Leandrew Koyanagi, MD;  Location: [place];  Service: Orthopedics;  Laterality: Left;   Family History  Problem Relation Age of Onset   Diabetes Mother    Heart disease  Mother    Colon polyps Father    Bladder Cancer Father    Heart disease Father    Kidney disease Father    Hemochromatosis Paternal Grandfather    Lung cancer Sister    Colon cancer Neg Hx    Esophageal cancer Neg Hx    Liver disease Neg Hx     Social History   Tobacco Use   Smoking status: Former    Packs/day: 0.25    Types: Cigarettes    Quit date: 06/27/2019    Years since quitting: 2.0   Smokeless tobacco: Never   Tobacco comments:    d/c as of 08/2019  Substance Use Topics   Alcohol use: No   Marital Status: Divorced  ROS  Review of Systems  Constitutional: Positive for malaise/fatigue.   Cardiovascular:  Positive for chest pain (left rib fracture). Negative for claudication, dyspnea on exertion, leg swelling, near-syncope, orthopnea, palpitations, paroxysmal nocturnal dyspnea and syncope.  Respiratory:  Negative for shortness of breath.   Musculoskeletal:  Positive for joint pain (left ankle after a fall).  Gastrointestinal:  Negative for melena.  Neurological:  Negative for dizziness.   Objective  Blood pressure (!) 92/54, pulse 79, temperature 97.8 F (36.6 C), temperature source Temporal, resp. rate 16, height 5\' 6"  (1.676 m), weight 234 lb 9.6 oz (106.4 kg), SpO2 93 %.  Vitals with BMI 07/02/2021 02/22/2021 02/22/2021  Height 5\' 6"  - -  Weight 234 lbs 10 oz - -  BMI 25.42 - -  Systolic 92 706 93  Diastolic 54 69 59  Pulse 79 87 74     Physical Exam Vitals reviewed.  Constitutional:      Comments: Moderately obese  Cardiovascular:     Rate and Rhythm: Normal rate and regular rhythm.     Pulses: Intact distal pulses.     Heart sounds: Normal heart sounds. No murmur heard.   No gallop.     Comments: No JVD. Pulmonary:     Effort: Pulmonary effort is normal.     Breath sounds: Normal breath sounds.  Musculoskeletal:     Right lower leg: No edema.     Left lower leg: No edema.   Laboratory examination:   Recent Labs    02/22/21 1612  NA 136  K 3.7  CL 104  CO2 25  GLUCOSE 94  BUN 8  CREATININE 0.72  CALCIUM 8.9  GFRNONAA >60   CrCl cannot be calculated (Patient's most recent lab result is older than the maximum 21 days allowed.).  CMP Latest Ref Rng & Units 02/22/2021 11/20/2018 02/27/2018  Glucose 70 - 99 mg/dL 94 82 104(H)  BUN 8 - 23 mg/dL 8 10 <5(L)  Creatinine 0.44 - 1.00 mg/dL 0.72 0.79 0.67  Sodium 135 - 145 mmol/L 136 143 143  Potassium 3.5 - 5.1 mmol/L 3.7 4.1 3.6  Chloride 98 - 111 mmol/L 104 106 112(H)  CO2 22 - 32 mmol/L 25 24 26   Calcium 8.9 - 10.3 mg/dL 8.9 8.9 8.2(L)  Total Protein 6.0 - 8.5 g/dL - 6.6 6.0(L)  Total Bilirubin 0.0 -  1.2 mg/dL - 0.3 0.3  Alkaline Phos 39 - 117 IU/L - 139(H) 123  AST 0 - 40 IU/L - 19 21  ALT 0 - 32 IU/L - 16 22   CBC Latest Ref Rng & Units 02/22/2021 11/20/2018 03/16/2018  WBC 4.0 - 10.5 K/uL 7.1 5.2 9.5  Hemoglobin 12.0 - 15.0 g/dL 15.0 14.9 14.9  Hematocrit 36.0 - 46.0 %  46.3(H) 43.3 45.7  Platelets 150 - 400 K/uL 229 228 242   Lipid Panel     Component Value Date/Time   CHOL 167 11/20/2018 0736   TRIG 81 11/20/2018 0736   HDL 43 11/20/2018 0736   LDLCALC 108 (H) 11/20/2018 0736   HEMOGLOBIN A1C No results found for: HGBA1C, MPG TSH No results for input(s): TSH in the last 8760 hours.  External labs:   11/20/18: TSH 2.000.  Allergies  No Known Allergies    Medications Prior to Visit:   Outpatient Medications Prior to Visit  Medication Sig Dispense Refill   ALPRAZolam (XANAX) 0.5 MG tablet Take 0.5 mg by mouth 2 (two) times daily as needed for anxiety.     flecainide (TAMBOCOR) 50 MG tablet Take 1 tablet (50 mg total) by mouth 2 (two) times daily. 180 tablet 3   mirabegron ER (MYRBETRIQ) 50 MG TB24 tablet Take 50 mg by mouth daily.     XARELTO 20 MG TABS tablet TAKE 1 TABLET BY MOUTH DAILY WITH SUPPER 90 tablet 0   metoprolol tartrate (LOPRESSOR) 50 MG tablet Take 25 mg by mouth 2 (two) times daily.     furosemide (LASIX) 20 MG tablet One tablet a day as needed for swelling (Patient not taking: Reported on 07/02/2021) 30 tablet 0   rosuvastatin (CRESTOR) 5 MG tablet Take 5 mg by mouth daily.     atorvastatin (LIPITOR) 10 MG tablet Take 1 tablet (10 mg total) by mouth daily. 90 tablet 3   HYDROcodone-acetaminophen (NORCO/VICODIN) 5-325 MG tablet Take 1-2 tablets by mouth every 6 (six) hours as needed for severe pain. 15 tablet 0   methocarbamol (ROBAXIN) 500 MG tablet Take 1 tablet (500 mg total) by mouth every 6 (six) hours as needed for muscle spasms. 30 tablet 0   metoprolol tartrate (LOPRESSOR) 25 MG tablet TAKE 1 TABLET BY MOUTH TWICE A DAY 30 tablet 0   predniSONE  (DELTASONE) 10 MG tablet TAKE 2 TABLETS BY MOUTH EVERY DAY WITH BREAKFAST 60 tablet 0   tolterodine (DETROL LA) 4 MG 24 hr capsule Take 4 mg by mouth daily.     No facility-administered medications prior to visit.   Final Medications at End of Visit    Current Meds  Medication Sig   ALPRAZolam (XANAX) 0.5 MG tablet Take 0.5 mg by mouth 2 (two) times daily as needed for anxiety.   flecainide (TAMBOCOR) 50 MG tablet Take 1 tablet (50 mg total) by mouth 2 (two) times daily.   mirabegron ER (MYRBETRIQ) 50 MG TB24 tablet Take 50 mg by mouth daily.   XARELTO 20 MG TABS tablet TAKE 1 TABLET BY MOUTH DAILY WITH SUPPER   [DISCONTINUED] metoprolol tartrate (LOPRESSOR) 50 MG tablet Take 25 mg by mouth 2 (two) times daily.   Radiology:   CT chest 12/17/2018: No acute abnormality is noted in the chest.  Coronary artery calcifications are noted suggesting coronary artery disease. Aortic Atherosclerosis   Cardiac Studies:   Nuclear stress test  [2015]: Normal.  A. Fib Ablation 2015 at [facility].  Event Monitor 30 days 08/28/2017: Paroxysmal sustained A. Fib with RVR - Symptomatic with palpitations. Latest episode was on 09/02/2017.  Echocardiogram 09/02/2017: Left ventricle cavity is normal in size. Moderate concentric hypertrophy of the left ventricle. Low normal LV systolic function. Normal diastolic filling pattern, normal LAP. Left ventricle regional wall motion findings: No wall motion abnormalities. Visual EF is 50-55%. Calculated EF 48%.  Mild tricuspid regurgitation. No evidence of pulmonary hypertension.  Patient in sinus rhythm during exam.  Vascular US Lower Extremity Venous Reflux 07/02/2018: Left: Abnormal reflux times were noted in the common femoral vein, great saphenous vein at the saphenofemoral junction, and great saphenous vein at the knee. There is no evidence of deep vein thrombosis in the lower extremity.There is no evidence of superficial venous thrombosis.  EKG:   07/02/2021: Sinus rhythm at a rate of 76 bpm. Normal axis. Nonspecific T abnormality in V1 and V2, probably normal variant but cannot exclude ischemia.  Normal QT interval.    EKG 12/23/2019: Normal sinus rhythm at rate of 63 bpm, normal axis.  Nonspecific T abnormality in V1 to V3, probably normal variant but cannot exclude ischemia.  Normal QT interval.  Low-voltage complexes.      Assessment     ICD-10-CM   1. Paroxysmal atrial fibrillation (HCC)  I48.0 EKG 12-Lead    2. Coronary artery calcification seen on CAT scan 12/17/2018  I25.10       Meds ordered this encounter  Medications   metoprolol tartrate (LOPRESSOR) 25 MG tablet    Sig: Take 1 tablet (25 mg total) by mouth 2 (two) times daily.    Dispense:  180 tablet    Refill:  3    Medications Discontinued During This Encounter  Medication Reason   atorvastatin (LIPITOR) 10 MG tablet Error   HYDROcodone-acetaminophen (NORCO/VICODIN) 5-325 MG tablet Error   methocarbamol (ROBAXIN) 500 MG tablet Error   metoprolol tartrate (LOPRESSOR) 25 MG tablet Error   tolterodine (DETROL LA) 4 MG 24 hr capsule Error   predniSONE (DELTASONE) 10 MG tablet Error   metoprolol tartrate (LOPRESSOR) 50 MG tablet Reorder    This patients CHA2DS2-VASc Score 1 (F) and yearly risk of stroke 0.6%.   Recommendations:   Sandie Swayze  is a  60 y.o. female with paroxysmal A. Fib diagnosed in  2015 and tried several medications that she did not help. She then underwent successful ablation; however, in Jan 2019 noted to have recurrent A fib with RVR on event monitor and is now on flecainide.  Patient was last seen in our office 11/2019 at which time she was stable and advised to follow-up in 1 year, unfortunately patient was lost to follow-up until now.  She recently called the office stating she has been started on Myrbetriq by urology, therefore was advised to reduce flecainide to half tablet (50 mg) twice daily and come in for follow-up and therapeutic  drug monitoring.  Patient now presents for follow-up of paroxysmal atrial fibrillation following reducing her flecainide dose.   Patient is feeling well overall, her only complaint today is worsening fatigue over the last several weeks.  Suspect this is related to low blood pressure.  We will therefore reduce metoprolol titrate from 50 mg to 25 mg p.o. twice daily.  Patient is now on rosuvastatin 5 mg daily which she is tolerating without issue, will continue this in view of coronary calcification noted on previous CT scan.  In regard to atrial fibrillation, patient preference is to remain on anticoagulation, although she could switch to aspirin 81 g given low cardioembolic risk.  She is maintaining sinus rhythm despite reduced flecainide dose.  EKG today notes normal sinus rhythm with normal QT interval.  Follow-up in 4 weeks, sooner if needed, for blood pressure management and atrial fibrillation.   Alethia Berthold, PA-C 07/02/2021, 2:44 PM Office: [phone]

## 2021-07-30 ENCOUNTER — Ambulatory Visit: Payer: 59 | Admitting: Student

## 2021-07-30 ENCOUNTER — Encounter: Payer: Self-pay | Admitting: Student

## 2021-07-30 ENCOUNTER — Other Ambulatory Visit: Payer: Self-pay

## 2021-07-30 VITALS — BP 102/64 | HR 68 | Temp 97.2°F | Resp 16 | Ht 66.0 in | Wt 235.0 lb

## 2021-07-30 DIAGNOSIS — I9589 Other hypotension: Secondary | ICD-10-CM

## 2021-07-30 DIAGNOSIS — I480 Paroxysmal atrial fibrillation: Secondary | ICD-10-CM

## 2021-07-30 NOTE — Progress Notes (Signed)
Primary Physician/Referring:  Velna Hatchet, MD  Patient ID: [name], female    DOB: 1961-05-19, 60 y.o.   MRN: [id number]  Chief Complaint  Patient presents with   Atrial Fibrillation   Follow-up    4 weeks   HPI:    [name]  is a 60 y.o. female with paroxysmal A. Fib diagnosed in  2015 and tried several medications that she did not help. She then underwent successful ablation; however, in Jan 2019 noted to have recurrent A fib with RVR on event monitor and is now on flecainide (reduced dose as she was recently started on Myrbetriq 06/2021).   Patient presents for 4-week follow-up.  At last office visit patient was complaining of fatigue and lightheadedness, her blood pressure was soft therefore reduce presser from 50 mg to 25 mg p.o. twice daily.  Patient continues to experience fatigue and episodes of lightheadedness.  She has had no known recurrence of atrial fibrillation.  In the past Dr. Einar Gip advised patient that she may discontinue Xarelto and instead take aspirin 81 mg daily given low cardioembolic risk.  Patient prefers to continue with anticoagulation despite knowledging the associated risks. She is tolerating anticoagulation without bleeding diathesis.  Past Medical History:  Diagnosis Date   A-fib [facility])    Anxiety    Arthritis    Dysrhythmia    hx of afib, afib ablation in 01/2014, no afib since then   Fractured rib 2021   Hyperlipidemia    Hypertension    Pneumonia    as a child   Sigmoid diverticulitis    Wears contact lenses    Wears dentures    Wears glasses    Past Surgical History:  Procedure Laterality Date   ABDOMINAL HYSTERECTOMY  1989   BIOPSY  10/25/2019   Procedure: BIOPSY;  Surgeon: Gatha Mayer, MD;  Location: [facility];  Service: Endoscopy;;   BLADDER SURGERY  2000   BREAST IMPLANT REMOVAL     CARDIAC ELECTROPHYSIOLOGY MAPPING AND ABLATION     for afib   CESAREAN SECTION     CHOLECYSTECTOMY     COLONOSCOPY WITH PROPOFOL N/A  10/25/2019   Procedure: COLONOSCOPY WITH PROPOFOL- using ultra slim scope and XL abdominal binder;  Surgeon: Gatha Mayer, MD;  Location: [facility];  Service: Endoscopy;  Laterality: N/A;  need ultra slim scope and XL abdominal binder   loop recorder explant  2015   LOOP RECORDER IMPLANT  2015   PLACEMENT OF BREAST IMPLANTS     POLYPECTOMY  10/25/2019   Procedure: POLYPECTOMY;  Surgeon: Gatha Mayer, MD;  [facility];  Service: Endoscopy;;   TONSILLECTOMY AND ADENOIDECTOMY     TOTAL KNEE ARTHROPLASTY Left 07/10/2017   Procedure: LEFT TOTAL KNEE ARTHROPLASTY;  Surgeon: Leandrew Koyanagi, MD;  Location: [facility];  Service: Orthopedics;  Laterality: Left;   Family History  Problem Relation Age of Onset   Diabetes Mother    Heart disease Mother    Colon polyps Father    Bladder Cancer Father    Heart disease Father    Kidney disease Father    Hemochromatosis Paternal Grandfather    Lung cancer Sister    Colon cancer Neg Hx    Esophageal cancer Neg Hx    Liver disease Neg Hx     Social History   Tobacco Use   Smoking status: Former    Packs/day: 0.25    Types: Cigarettes    Quit date: 06/27/2019  Years since quitting: 2.0   Smokeless tobacco: Never   Tobacco comments:    d/c as of 08/2019  Substance Use Topics   Alcohol use: No   Marital Status: Divorced  ROS  Review of Systems  Constitutional: Positive for malaise/fatigue.  Cardiovascular:  Negative for chest pain (resolved), claudication, dyspnea on exertion, leg swelling, near-syncope, orthopnea, palpitations, paroxysmal nocturnal dyspnea and syncope.  Respiratory:  Negative for shortness of breath.   Musculoskeletal:  Negative for joint pain (left ankle, improved).  Gastrointestinal:  Negative for melena.  Neurological:  Positive for light-headedness.   Objective  Blood pressure 102/64, pulse 68, temperature (!) 97.2 F (36.2 C), temperature source Temporal, resp. rate 16, height 5\' 6"  (1.676 m), weight 235  lb (106.6 kg), SpO2 93 %.  Vitals with BMI 07/30/2021 07/02/2021 02/22/2021  Height 5\' 6"  5\' 6"  -  Weight 235 lbs 234 lbs 10 oz -  BMI 41.93 79.02 -  Systolic 409 92 735  Diastolic 64 54 69  Pulse 68 79 87     Physical Exam Vitals reviewed.  Constitutional:      Comments: Moderately obese  Cardiovascular:     Rate and Rhythm: Normal rate and regular rhythm.     Pulses: Intact distal pulses.     Heart sounds: Normal heart sounds. No murmur heard.   No gallop.     Comments: No JVD. Pulmonary:     Effort: Pulmonary effort is normal.     Breath sounds: Normal breath sounds.  Musculoskeletal:     Right lower leg: No edema.     Left lower leg: No edema.  Physical exam unchanged compared to previous office visit.  Laboratory examination:   Recent Labs    02/22/21 1612  NA 136  K 3.7  CL 104  CO2 25  GLUCOSE 94  BUN 8  CREATININE 0.72  CALCIUM 8.9  GFRNONAA >60   CrCl cannot be calculated (Patient's most recent lab result is older than the maximum 21 days allowed.).  CMP Latest Ref Rng & Units 02/22/2021 11/20/2018 02/27/2018  Glucose 70 - 99 mg/dL 94 82 104(H)  BUN 8 - 23 mg/dL 8 10 <5(L)  Creatinine 0.44 - 1.00 mg/dL 0.72 0.79 0.67  Sodium 135 - 145 mmol/L 136 143 143  Potassium 3.5 - 5.1 mmol/L 3.7 4.1 3.6  Chloride 98 - 111 mmol/L 104 106 112(H)  CO2 22 - 32 mmol/L 25 24 26   Calcium 8.9 - 10.3 mg/dL 8.9 8.9 8.2(L)  Total Protein 6.0 - 8.5 g/dL - 6.6 6.0(L)  Total Bilirubin 0.0 - 1.2 mg/dL - 0.3 0.3  Alkaline Phos 39 - 117 IU/L - 139(H) 123  AST 0 - 40 IU/L - 19 21  ALT 0 - 32 IU/L - 16 22   CBC Latest Ref Rng & Units 02/22/2021 11/20/2018 03/16/2018  WBC 4.0 - 10.5 K/uL 7.1 5.2 9.5  Hemoglobin 12.0 - 15.0 g/dL 15.0 14.9 14.9  Hematocrit 36.0 - 46.0 % 46.3(H) 43.3 45.7  Platelets 150 - 400 K/uL 229 228 242   Lipid Panel     Component Value Date/Time   CHOL 167 11/20/2018 0736   TRIG 81 11/20/2018 0736   HDL 43 11/20/2018 0736   LDLCALC 108 (H) 11/20/2018 0736    HEMOGLOBIN A1C No results found for: HGBA1C, MPG TSH No results for input(s): TSH in the last 8760 hours.  External labs:   11/20/18: TSH 2.000.  Allergies  No Known Allergies    Medications Prior to  Visit:   Outpatient Medications Prior to Visit  Medication Sig Dispense Refill   ALPRAZolam (XANAX) 0.5 MG tablet Take 0.5 mg by mouth 2 (two) times daily as needed for anxiety.     flecainide (TAMBOCOR) 50 MG tablet Take 1 tablet (50 mg total) by mouth 2 (two) times daily. 180 tablet 3   furosemide (LASIX) 20 MG tablet One tablet a day as needed for swelling 30 tablet 0   mirabegron ER (MYRBETRIQ) 50 MG TB24 tablet Take 50 mg by mouth daily.     rosuvastatin (CRESTOR) 5 MG tablet Take 5 mg by mouth daily.     XARELTO 20 MG TABS tablet TAKE 1 TABLET BY MOUTH DAILY WITH SUPPER 90 tablet 0   metoprolol tartrate (LOPRESSOR) 25 MG tablet Take 1 tablet (25 mg total) by mouth 2 (two) times daily. 180 tablet 3   nitrofurantoin (MACRODANTIN) 50 MG capsule Take 50 mg by mouth daily.     No facility-administered medications prior to visit.   Final Medications at End of Visit    Current Meds  Medication Sig   ALPRAZolam (XANAX) 0.5 MG tablet Take 0.5 mg by mouth 2 (two) times daily as needed for anxiety.   flecainide (TAMBOCOR) 50 MG tablet Take 1 tablet (50 mg total) by mouth 2 (two) times daily.   furosemide (LASIX) 20 MG tablet One tablet a day as needed for swelling   mirabegron ER (MYRBETRIQ) 50 MG TB24 tablet Take 50 mg by mouth daily.   rosuvastatin (CRESTOR) 5 MG tablet Take 5 mg by mouth daily.   XARELTO 20 MG TABS tablet TAKE 1 TABLET BY MOUTH DAILY WITH SUPPER   [DISCONTINUED] metoprolol tartrate (LOPRESSOR) 25 MG tablet Take 1 tablet (25 mg total) by mouth 2 (two) times daily.   Radiology:   CT chest 12/17/2018: No acute abnormality is noted in the chest.  Coronary artery calcifications are noted suggesting coronary artery disease. Aortic Atherosclerosis   Cardiac  Studies:   Nuclear stress test  [2015]: Normal.  A. Fib Ablation 2015 at [facility].  Event Monitor 30 days 08/28/2017: Paroxysmal sustained A. Fib with RVR - Symptomatic with palpitations. Latest episode was on 09/02/2017.  Echocardiogram 09/02/2017: Left ventricle cavity is normal in size. Moderate concentric hypertrophy of the left ventricle. Low normal LV systolic function. Normal diastolic filling pattern, normal LAP. Left ventricle regional wall motion findings: No wall motion abnormalities. Visual EF is 50-55%. Calculated EF 48%.  Mild tricuspid regurgitation. No evidence of pulmonary hypertension. Patient in sinus rhythm during exam.  Vascular US Lower Extremity Venous Reflux 07/02/2018: Left: Abnormal reflux times were noted in the common femoral vein, great saphenous vein at the saphenofemoral junction, and great saphenous vein at the knee. There is no evidence of deep vein thrombosis in the lower extremity.There is no evidence of superficial venous thrombosis.  EKG:  07/02/2021: Sinus rhythm at a rate of 76 bpm. Normal axis. Nonspecific T abnormality in V1 and V2, probably normal variant but cannot exclude ischemia.  Normal QT interval.    12/23/2019: Normal sinus rhythm at rate of 63 bpm, normal axis.  Nonspecific T abnormality in V1 to V3, probably normal variant but cannot exclude ischemia.  Normal QT interval.  Low-voltage complexes.      Assessment     ICD-10-CM   1. Paroxysmal atrial fibrillation (HCC)  I48.0     2. Other specified hypotension  I95.89       No orders of the defined types were placed in  this encounter.   Medications Discontinued During This Encounter  Medication Reason   metoprolol tartrate (LOPRESSOR) 25 MG tablet Discontinued by provider    This patients CHA2DS2-VASc Score 1 (F) and yearly risk of stroke 0.6%.   Recommendations:   [name]  is a  60 y.o. female with paroxysmal A. Fib diagnosed in  2015 and tried several medications  that she did not help. She then underwent successful ablation; however, in Jan 2019 noted to have recurrent A fib with RVR on event monitor and is now on flecainide (reduced dose as she was recently started on Myrbetriq 06/2021).   Patient presents for 4-week follow-up.  At last office visit patient was complaining of fatigue and lightheadedness, her blood pressure was soft therefore reduce presser from 50 mg to 25 mg p.o. twice daily.  Patient's blood pressure remains soft today's office visit.  She is clinically maintaining sinus rhythm.  We will stop metoprolol altogether given symptomatic hypotension.  Patient will continue to monitor for signs and symptoms of atrial fibrillation recurrence and notify our office if this occurs.  In regard to atrial fibrillation, patient preference is to remain on anticoagulation, although she could switch to aspirin 81 g given low cardioembolic risk.  Follow-up in 6 months, sooner if needed, for blood pressure management and paroxysmal atrial fibrillation.   Toni Berthold, PA-C 07/30/2021, 2:55 PM Office: [phone]

## 2021-08-06 ENCOUNTER — Telehealth: Payer: Self-pay | Admitting: Student

## 2021-08-06 NOTE — Telephone Encounter (Signed)
Patient called the office with concerns that she has had recurrent episodes of atrial fibrillation since stopping metoprolol last office visit.  Her blood pressure remains soft with systolic BP averaging 00B millimeters Hg.  She is also discontinued Myrbetriq as insurance will not cover it.  She has not taken Myrbetriq for the last 4 days.  We will therefore increase flecainide back to previous dosing of 100 mg p.o. twice daily.  Patient will continue to monitor symptoms and notify our office if she has recurrent episodes of atrial fibrillation.

## 2021-08-22 ENCOUNTER — Other Ambulatory Visit: Payer: Self-pay | Admitting: Cardiology

## 2021-08-22 DIAGNOSIS — I4891 Unspecified atrial fibrillation: Secondary | ICD-10-CM

## 2021-09-05 DIAGNOSIS — N3946 Mixed incontinence: Secondary | ICD-10-CM | POA: Diagnosis not present

## 2021-09-05 DIAGNOSIS — N3020 Other chronic cystitis without hematuria: Secondary | ICD-10-CM | POA: Diagnosis not present

## 2021-09-05 DIAGNOSIS — N390 Urinary tract infection, site not specified: Secondary | ICD-10-CM | POA: Diagnosis not present

## 2021-09-05 DIAGNOSIS — B9620 Unspecified Escherichia coli [E. coli] as the cause of diseases classified elsewhere: Secondary | ICD-10-CM | POA: Diagnosis not present

## 2021-11-18 ENCOUNTER — Other Ambulatory Visit: Payer: Self-pay | Admitting: Cardiology

## 2021-11-18 DIAGNOSIS — I4891 Unspecified atrial fibrillation: Secondary | ICD-10-CM

## 2022-01-25 NOTE — Progress Notes (Signed)
Primary Physician/Referring:  Velna Hatchet, MD  Patient ID: [name], female    DOB: 12-05-1960, 61 y.o.   MRN: [id number]  Chief Complaint  Patient presents with   Atrial Fibrillation   Follow-up    6 month   HPI:    [name]  is a 61 y.o. female with paroxysmal A. Fib diagnosed in  2015 and tried several medications that she did not help. She then underwent successful ablation; however, in Jan 2019 noted to have recurrent A fib with RVR on event monitor and is now on flecainide.   Patient was last seen in our office 07/30/2021 at which time given soft blood pressure stopped Lopressor.  However patient subsequently developed recurrent episodes of palpitations and fatigue suggestive of A-fib.  Patient independently resumed Lopressor 25 mg nightly which has significantly improved symptoms.  Since resuming this patient has had no known recurrence of atrial fibrillation.  She is currently taking flecainide 50 mg p.o. twice daily.  She continues to tolerate anticoagulation without bleeding diathesis.  Denies chest pain, palpitations, syncope, near syncope.  She does have mild dyspnea on exertion which is longstanding and stable.  Past Medical History:  Diagnosis Date   A-fib [facility])    Anxiety    Arthritis    Dysrhythmia    hx of afib, afib ablation in 01/2014, no afib since then   Fractured rib 2021   Hyperlipidemia    Hypertension    Pneumonia    as a child   Sigmoid diverticulitis    Wears contact lenses    Wears dentures    Wears glasses    Past Surgical History:  Procedure Laterality Date   ABDOMINAL HYSTERECTOMY  1989   BIOPSY  10/25/2019   Procedure: BIOPSY;  Surgeon: Gatha Mayer, MD;  [facility]: [facility];  Service: Endoscopy;;   BLADDER SURGERY  2000   BREAST IMPLANT REMOVAL     CARDIAC ELECTROPHYSIOLOGY MAPPING AND ABLATION     for afib   CESAREAN SECTION     CHOLECYSTECTOMY     COLONOSCOPY WITH PROPOFOL N/A 10/25/2019   Procedure: COLONOSCOPY WITH  PROPOFOL- using ultra slim scope and XL abdominal binder;  Surgeon: Gatha Mayer, MD;  Location: [facility];  Service: Endoscopy;  Laterality: N/A;  need ultra slim scope and XL abdominal binder   loop recorder explant  2015   LOOP RECORDER IMPLANT  2015   PLACEMENT OF BREAST IMPLANTS     POLYPECTOMY  10/25/2019   Procedure: POLYPECTOMY;  Surgeon: Gatha Mayer, MD;  [facility];  Service: Endoscopy;;   TONSILLECTOMY AND ADENOIDECTOMY     TOTAL KNEE ARTHROPLASTY Left 07/10/2017   Procedure: LEFT TOTAL KNEE ARTHROPLASTY;  Surgeon: Leandrew Koyanagi, MD;  Location: [facility];  Service: Orthopedics;  Laterality: Left;   Family History  Problem Relation Age of Onset   Diabetes Mother    Heart disease Mother    Colon polyps Father    Bladder Cancer Father    Heart disease Father    Kidney disease Father    Hemochromatosis Paternal Grandfather    Lung cancer Sister    Colon cancer Neg Hx    Esophageal cancer Neg Hx    Liver disease Neg Hx     Social History   Tobacco Use   Smoking status: Former    Packs/day: 0.25    Years: 45.00    Pack years: 11.25    Types: Cigarettes    Quit date: 06/27/2019  Years since quitting: 2.5   Smokeless tobacco: Never   Tobacco comments:    d/c as of 08/2019  Substance Use Topics   Alcohol use: No   Marital Status: Divorced  ROS  Review of Systems  Constitutional: Negative for malaise/fatigue and weight gain.  Cardiovascular:  Negative for chest pain, claudication, dyspnea on exertion, leg swelling, near-syncope, orthopnea, palpitations, paroxysmal nocturnal dyspnea and syncope.  Neurological:  Negative for dizziness.   Objective  Blood pressure 112/69, pulse 68, temperature 98.2 F (36.8 C), resp. rate 16, height '5\' 6"'$  (1.676 m), weight 237 lb (107.5 kg), SpO2 95 %.     01/28/2022    8:56 AM 07/30/2021    2:16 PM 07/02/2021    1:54 PM  Vitals with BMI  Height '5\' 6"'$  '5\' 6"'$  '5\' 6"'$   Weight 237 lbs 235 lbs 234 lbs 10 oz  BMI 38.27  97.67 34.19  Systolic 379 024 92  Diastolic 69 64 54  Pulse 68 68 79     Physical Exam Vitals reviewed.  Constitutional:      Appearance: She is obese.     Comments: Moderately obese  Cardiovascular:     Rate and Rhythm: Normal rate and regular rhythm.     Pulses: Intact distal pulses.     Heart sounds: Normal heart sounds. No murmur heard.   No gallop.     Comments: No JVD. Pulmonary:     Effort: Pulmonary effort is normal.     Breath sounds: Normal breath sounds.  Musculoskeletal:     Right lower leg: No edema.     Left lower leg: No edema.   Laboratory examination:   Recent Labs    02/22/21 1612  NA 136  K 3.7  CL 104  CO2 25  GLUCOSE 94  BUN 8  CREATININE 0.72  CALCIUM 8.9  GFRNONAA >60   CrCl cannot be calculated (Patient's most recent lab result is older than the maximum 21 days allowed.).     Latest Ref Rng & Units 02/22/2021    4:12 PM 11/20/2018    7:36 AM 02/27/2018    4:33 AM  CMP  Glucose 70 - 99 mg/dL 94   82   104    BUN 8 - 23 mg/dL 8   10   <5    Creatinine 0.44 - 1.00 mg/dL 0.72   0.79   0.67    Sodium 135 - 145 mmol/L 136   143   143    Potassium 3.5 - 5.1 mmol/L 3.7   4.1   3.6    Chloride 98 - 111 mmol/L 104   106   112    CO2 22 - 32 mmol/L '25   24   26    '$ Calcium 8.9 - 10.3 mg/dL 8.9   8.9   8.2    Total Protein 6.0 - 8.5 g/dL  6.6   6.0    Total Bilirubin 0.0 - 1.2 mg/dL  0.3   0.3    Alkaline Phos 39 - 117 IU/L  139   123    AST 0 - 40 IU/L  19   21    ALT 0 - 32 IU/L  16   22        Latest Ref Rng & Units 02/22/2021    4:12 PM 11/20/2018    7:36 AM 03/16/2018    7:35 PM  CBC  WBC 4.0 - 10.5 K/uL 7.1   5.2   9.5  Hemoglobin 12.0 - 15.0 g/dL 15.0   14.9   14.9    Hematocrit 36.0 - 46.0 % 46.3   43.3   45.7    Platelets 150 - 400 K/uL 229   228   242     Lipid Panel     Component Value Date/Time   CHOL 167 11/20/2018 0736   TRIG 81 11/20/2018 0736   HDL 43 11/20/2018 0736   LDLCALC 108 (H) 11/20/2018 0736   HEMOGLOBIN  A1C No results found for: HGBA1C, MPG TSH No results for input(s): TSH in the last 8760 hours.  External labs:  11/20/18: TSH 2.000.   Allergies  No Known Allergies   Medications Prior to Visit:   Outpatient Medications Prior to Visit  Medication Sig Dispense Refill   ALPRAZolam (XANAX) 0.5 MG tablet Take 0.5 mg by mouth 2 (two) times daily as needed for anxiety.     flecainide (TAMBOCOR) 50 MG tablet Take 1 tablet (50 mg total) by mouth 2 (two) times daily. 180 tablet 3   metoprolol tartrate (LOPRESSOR) 25 MG tablet Take 25 mg by mouth at bedtime.     rosuvastatin (CRESTOR) 5 MG tablet Take 5 mg by mouth daily.     XARELTO 20 MG TABS tablet TAKE 1 TABLET BY MOUTH DAILY WITH SUPPER 90 tablet 0   furosemide (LASIX) 20 MG tablet One tablet a day as needed for swelling 30 tablet 0   nitrofurantoin (MACRODANTIN) 50 MG capsule Take 50 mg by mouth daily.     rosuvastatin (CRESTOR) 5 MG tablet Take 5 mg by mouth daily.     No facility-administered medications prior to visit.   Final Medications at End of Visit    Current Meds  Medication Sig   ALPRAZolam (XANAX) 0.5 MG tablet Take 0.5 mg by mouth 2 (two) times daily as needed for anxiety.   flecainide (TAMBOCOR) 50 MG tablet Take 1 tablet (50 mg total) by mouth 2 (two) times daily.   metoprolol tartrate (LOPRESSOR) 25 MG tablet Take 25 mg by mouth at bedtime.   rosuvastatin (CRESTOR) 5 MG tablet Take 5 mg by mouth daily.   XARELTO 20 MG TABS tablet TAKE 1 TABLET BY MOUTH DAILY WITH SUPPER   Radiology:   CT chest 12/17/2018: No acute abnormality is noted in the chest.  Coronary artery calcifications are noted suggesting coronary artery disease. Aortic Atherosclerosis   Cardiac Studies:   Nuclear stress test  [2015]: Normal.  A. Fib Ablation 2015 at [facility].  Event Monitor 30 days 08/28/2017: Paroxysmal sustained A. Fib with RVR - Symptomatic with palpitations. Latest episode was on 09/02/2017.  Echocardiogram  09/02/2017: Left ventricle cavity is normal in size. Moderate concentric hypertrophy of the left ventricle. Low normal LV systolic function. Normal diastolic filling pattern, normal LAP. Left ventricle regional wall motion findings: No wall motion abnormalities. Visual EF is 50-55%. Calculated EF 48%.  Mild tricuspid regurgitation. No evidence of pulmonary hypertension. Patient in sinus rhythm during exam.  Vascular US Lower Extremity Venous Reflux 07/02/2018: Left: Abnormal reflux times were noted in the common femoral vein, great saphenous vein at the saphenofemoral junction, and great saphenous vein at the knee. There is no evidence of deep vein thrombosis in the lower extremity.There is no evidence of superficial venous thrombosis.  EKG:  01/28/2022: Sinus rhythm at a rate of 67 bpm.  Normal axis.  Left atrial enlargement.  No evidence of ischemia or underlying injury pattern.   12/23/2019: Normal sinus rhythm at rate  of 63 bpm, normal axis.  Nonspecific T abnormality in V1 to V3, probably normal variant but cannot exclude ischemia.  Normal QT interval.  Low-voltage complexes.      Assessment     ICD-10-CM   1. Paroxysmal atrial fibrillation (HCC)  I48.0 EKG 12-Lead      No orders of the defined types were placed in this encounter.   Medications Discontinued During This Encounter  Medication Reason   furosemide (LASIX) 20 MG tablet    nitrofurantoin (MACRODANTIN) 50 MG capsule    rosuvastatin (CRESTOR) 5 MG tablet     This patients CHA2DS2-VASc Score 1 (F) and yearly risk of stroke 0.6%.   Recommendations:   [name]  is a  61 y.o. female with paroxysmal A. Fib diagnosed in  2015 and tried several medications that she did not help. She then underwent successful ablation; however, in Jan 2019 noted to have recurrent A fib with RVR on event monitor and is now on flecainide.   Patient is presently taking flecainide 50 mg p.o. twice daily as well as Lopressor 25 mg once daily at  night.  She has had no known recurrence of atrial fibrillation on this regimen, and blood pressure stable.  We will continue present medications.  She also continues to tolerate anticoagulation without bleeding diathesis.  Have previously discussed switching to aspirin 81 mg daily given low cardioembolic risk, however patient prefers to remain on anticoagulation. Continue Crestor.   Counseled patient regarding diet and lifestyle modifications including weight loss.  She is otherwise stable from a cardiovascular standpoint.  Follow-up in 1 year, sooner if needed.   Alethia Berthold, PA-C 01/28/2022, 9:27 AM Office: [phone]

## 2022-01-28 ENCOUNTER — Ambulatory Visit: Payer: Self-pay | Admitting: Student

## 2022-01-28 ENCOUNTER — Encounter: Payer: Self-pay | Admitting: Student

## 2022-01-28 ENCOUNTER — Ambulatory Visit: Payer: 59 | Admitting: Student

## 2022-01-28 VITALS — BP 112/69 | HR 68 | Temp 98.2°F | Resp 16 | Ht 66.0 in | Wt 237.0 lb

## 2022-01-28 DIAGNOSIS — I480 Paroxysmal atrial fibrillation: Secondary | ICD-10-CM | POA: Diagnosis not present

## 2022-02-13 DIAGNOSIS — M79662 Pain in left lower leg: Secondary | ICD-10-CM | POA: Diagnosis not present

## 2022-02-13 DIAGNOSIS — M79671 Pain in right foot: Secondary | ICD-10-CM | POA: Diagnosis not present

## 2022-02-25 ENCOUNTER — Other Ambulatory Visit: Payer: Self-pay | Admitting: Cardiology

## 2022-02-25 DIAGNOSIS — I4891 Unspecified atrial fibrillation: Secondary | ICD-10-CM

## 2022-07-04 ENCOUNTER — Other Ambulatory Visit: Payer: Self-pay | Admitting: Cardiology

## 2022-07-04 DIAGNOSIS — I4891 Unspecified atrial fibrillation: Secondary | ICD-10-CM

## 2022-07-24 ENCOUNTER — Encounter: Payer: Self-pay | Admitting: Podiatry

## 2022-07-24 ENCOUNTER — Ambulatory Visit (INDEPENDENT_AMBULATORY_CARE_PROVIDER_SITE_OTHER): Payer: 59

## 2022-07-24 ENCOUNTER — Ambulatory Visit: Payer: 59 | Admitting: Podiatry

## 2022-07-24 DIAGNOSIS — M21611 Bunion of right foot: Secondary | ICD-10-CM | POA: Diagnosis not present

## 2022-07-24 DIAGNOSIS — L600 Ingrowing nail: Secondary | ICD-10-CM | POA: Diagnosis not present

## 2022-07-24 DIAGNOSIS — M7751 Other enthesopathy of right foot: Secondary | ICD-10-CM | POA: Diagnosis not present

## 2022-07-24 DIAGNOSIS — L84 Corns and callosities: Secondary | ICD-10-CM | POA: Diagnosis not present

## 2022-07-24 DIAGNOSIS — M21619 Bunion of unspecified foot: Secondary | ICD-10-CM | POA: Diagnosis not present

## 2022-07-24 DIAGNOSIS — M21612 Bunion of left foot: Secondary | ICD-10-CM

## 2022-07-24 MED ORDER — TRIAMCINOLONE ACETONIDE 10 MG/ML IJ SUSP
10.0000 mg | Freq: Once | INTRAMUSCULAR | Status: AC
  Administered 2022-07-24: 10 mg

## 2022-07-24 NOTE — Progress Notes (Signed)
Subjective:   Patient ID: [name], female   DOB: 61 y.o.   MRN: [id number]   HPI Patient presents with a severely painful callus formation fifth metatarsal right that is been present for a long time and states its gotten worse recently with history of knee problems left that may cause her to walk differently   ROS      Objective:  Physical Exam  Neurovascular status intact inflammation fluid buildup around the fifth MPJ right foot with plantarflexed metatarsal keratotic lesion which is formed with fluid being the biggest problem.  Patient has good digital perfusion well-oriented x 3     Assessment:  Inflammatory capsulitis fifth MPJ right with fluid buildup and keratotic lesion painful when pressed with probable bone deformity     Plan:  H&P x-rays reviewed.  I did discuss the possibility for metatarsal head resection osteotomy depending on response to conservative and today I went ahead did sterile prep and injected the plantar capsule right 3 mg Dexasone Kenalog 5 mg Xylocaine I did deep debridement of the lesion finding there to be several lesions present and instructed on padding of the area and that ultimate surgery may be necessary.  Reappoint as symptoms indicate  X-rays indicate that there is pressure directly on the fifth metatarsal head right with probability for structural deformity

## 2022-08-23 DIAGNOSIS — E782 Mixed hyperlipidemia: Secondary | ICD-10-CM | POA: Diagnosis not present

## 2022-08-23 DIAGNOSIS — E559 Vitamin D deficiency, unspecified: Secondary | ICD-10-CM | POA: Diagnosis not present

## 2022-09-02 DIAGNOSIS — D692 Other nonthrombocytopenic purpura: Secondary | ICD-10-CM | POA: Diagnosis not present

## 2022-09-02 DIAGNOSIS — E559 Vitamin D deficiency, unspecified: Secondary | ICD-10-CM | POA: Diagnosis not present

## 2022-09-02 DIAGNOSIS — R69 Illness, unspecified: Secondary | ICD-10-CM | POA: Diagnosis not present

## 2022-09-02 DIAGNOSIS — Z1339 Encounter for screening examination for other mental health and behavioral disorders: Secondary | ICD-10-CM | POA: Diagnosis not present

## 2022-09-02 DIAGNOSIS — I4891 Unspecified atrial fibrillation: Secondary | ICD-10-CM | POA: Diagnosis not present

## 2022-09-02 DIAGNOSIS — Z23 Encounter for immunization: Secondary | ICD-10-CM | POA: Diagnosis not present

## 2022-09-02 DIAGNOSIS — Z1331 Encounter for screening for depression: Secondary | ICD-10-CM | POA: Diagnosis not present

## 2022-09-02 DIAGNOSIS — Z0000 Encounter for general adult medical examination without abnormal findings: Secondary | ICD-10-CM | POA: Diagnosis not present

## 2022-09-02 DIAGNOSIS — R3915 Urgency of urination: Secondary | ICD-10-CM | POA: Diagnosis not present

## 2022-09-02 DIAGNOSIS — J441 Chronic obstructive pulmonary disease with (acute) exacerbation: Secondary | ICD-10-CM | POA: Diagnosis not present

## 2022-09-02 DIAGNOSIS — E782 Mixed hyperlipidemia: Secondary | ICD-10-CM | POA: Diagnosis not present

## 2022-09-02 DIAGNOSIS — I700 Atherosclerosis of aorta: Secondary | ICD-10-CM | POA: Diagnosis not present

## 2022-09-02 DIAGNOSIS — D6869 Other thrombophilia: Secondary | ICD-10-CM | POA: Diagnosis not present

## 2022-11-06 ENCOUNTER — Other Ambulatory Visit: Payer: Self-pay | Admitting: Cardiology

## 2022-11-06 DIAGNOSIS — I480 Paroxysmal atrial fibrillation: Secondary | ICD-10-CM

## 2022-11-06 NOTE — Telephone Encounter (Signed)
refill 

## 2022-11-13 DIAGNOSIS — F329 Major depressive disorder, single episode, unspecified: Secondary | ICD-10-CM | POA: Diagnosis not present

## 2022-11-26 DIAGNOSIS — F329 Major depressive disorder, single episode, unspecified: Secondary | ICD-10-CM | POA: Diagnosis not present

## 2022-12-10 ENCOUNTER — Encounter: Payer: Self-pay | Admitting: Internal Medicine

## 2022-12-17 DIAGNOSIS — F329 Major depressive disorder, single episode, unspecified: Secondary | ICD-10-CM | POA: Diagnosis not present

## 2022-12-19 DIAGNOSIS — R319 Hematuria, unspecified: Secondary | ICD-10-CM | POA: Diagnosis not present

## 2022-12-19 DIAGNOSIS — Z6837 Body mass index (BMI) 37.0-37.9, adult: Secondary | ICD-10-CM | POA: Diagnosis not present

## 2022-12-19 DIAGNOSIS — R32 Unspecified urinary incontinence: Secondary | ICD-10-CM | POA: Diagnosis not present

## 2022-12-19 DIAGNOSIS — B372 Candidiasis of skin and nail: Secondary | ICD-10-CM | POA: Diagnosis not present

## 2022-12-19 DIAGNOSIS — Z1231 Encounter for screening mammogram for malignant neoplasm of breast: Secondary | ICD-10-CM | POA: Diagnosis not present

## 2022-12-19 DIAGNOSIS — Z01419 Encounter for gynecological examination (general) (routine) without abnormal findings: Secondary | ICD-10-CM | POA: Diagnosis not present

## 2022-12-19 DIAGNOSIS — Z13820 Encounter for screening for osteoporosis: Secondary | ICD-10-CM | POA: Diagnosis not present

## 2022-12-19 DIAGNOSIS — Z7689 Persons encountering health services in other specified circumstances: Secondary | ICD-10-CM | POA: Diagnosis not present

## 2022-12-19 DIAGNOSIS — M8580 Other specified disorders of bone density and structure, unspecified site: Secondary | ICD-10-CM | POA: Diagnosis not present

## 2022-12-19 DIAGNOSIS — F17210 Nicotine dependence, cigarettes, uncomplicated: Secondary | ICD-10-CM | POA: Diagnosis not present

## 2023-01-20 ENCOUNTER — Other Ambulatory Visit: Payer: Self-pay | Admitting: Cardiology

## 2023-01-20 DIAGNOSIS — I4891 Unspecified atrial fibrillation: Secondary | ICD-10-CM

## 2023-01-29 ENCOUNTER — Ambulatory Visit: Payer: 59 | Admitting: Cardiology

## 2023-01-29 ENCOUNTER — Ambulatory Visit: Payer: 59 | Admitting: Student

## 2023-01-30 ENCOUNTER — Ambulatory Visit: Payer: 59 | Admitting: Cardiology

## 2023-01-30 ENCOUNTER — Encounter: Payer: Self-pay | Admitting: Cardiology

## 2023-01-30 VITALS — BP 102/71 | HR 72 | Resp 16 | Ht 66.0 in | Wt 229.0 lb

## 2023-01-30 DIAGNOSIS — I480 Paroxysmal atrial fibrillation: Secondary | ICD-10-CM | POA: Diagnosis not present

## 2023-01-30 MED ORDER — METOPROLOL SUCCINATE ER 25 MG PO TB24
25.0000 mg | ORAL_TABLET | Freq: Every day | ORAL | 3 refills | Status: DC
Start: 2023-01-30 — End: 2023-06-25

## 2023-01-30 NOTE — Progress Notes (Signed)
Primary Physician/Referring:  [name], MD  Patient ID: [name], female    DOB: [birth year], 62 y.o.   MRN: [id number]  Chief Complaint  Patient presents with   Paroxysmal atrial fibrillation   Follow-up    1 year   HPI:    [name]  is a 62 y.o. female with paroxysmal A. Fib diagnosed in  2015 and tried several medications that she did not help. She then underwent successful ablation; however, in Jan 2019 noted to have recurrent A fib with RVR on event monitor and is now on flecainide.   Patient presents for lab visit, states that due to low blood pressure, she has been taking metoprolol tartrate 25 mg only 1/2 tablet daily and also taking flecainide 50 mg once a day but with recurrence of atrial fibrillation she is taking 1 additional dose of flecainide with immediate conversion to sinus rhythm.  Otherwise remains asymptomatic.  Past Medical History:  Diagnosis Date   A-fib [facility])    Anxiety    Arthritis    Dysrhythmia    hx of afib, afib ablation in 01/2014, no afib since then   Fractured rib 2021   Hyperlipidemia    Hypertension    Pneumonia    as a child   Sigmoid diverticulitis    Wears contact lenses    Wears dentures    Wears glasses    Past Surgical History:  Procedure Laterality Date   ABDOMINAL HYSTERECTOMY  1989   BIOPSY  10/25/2019   Procedure: BIOPSY;  Surgeon: Iva Boop, MD;  Location: [facility];  Service: Endoscopy;;   BLADDER SURGERY  2000   BREAST IMPLANT REMOVAL     CARDIAC ELECTROPHYSIOLOGY MAPPING AND ABLATION     for afib   CESAREAN SECTION     CHOLECYSTECTOMY     COLONOSCOPY WITH PROPOFOL N/A 10/25/2019   Procedure: COLONOSCOPY WITH PROPOFOL- using ultra slim scope and XL abdominal binder;  Surgeon: Iva Boop, MD;  Location: [facility];  Service: Endoscopy;  Laterality: N/A;  need ultra slim scope and XL abdominal binder   loop recorder explant  2015   LOOP RECORDER IMPLANT  2015   PLACEMENT OF BREAST IMPLANTS      POLYPECTOMY  10/25/2019   Procedure: POLYPECTOMY;  Surgeon: Iva Boop, MD;  Location: [facility];  Service: Endoscopy;;   TONSILLECTOMY AND ADENOIDECTOMY     TOTAL KNEE ARTHROPLASTY Left 07/10/2017   Procedure: LEFT TOTAL KNEE ARTHROPLASTY;  Surgeon: Tarry Kos, MD;  Location: [facility] OR;  Service: Orthopedics;  Laterality: Left;   Family History  Problem Relation Age of Onset   Diabetes Mother    Heart disease Mother    Colon polyps Father    Bladder Cancer Father    Heart disease Father    Kidney disease Father    Hemochromatosis Paternal Grandfather    Lung cancer Sister    Colon cancer Neg Hx    Esophageal cancer Neg Hx    Liver disease Neg Hx     Social History   Tobacco Use   Smoking status: Some Days    Packs/day: 0.25    Years: 45.00    Additional pack years: 0.00    Total pack years: 11.25    Types: Cigarettes   Smokeless tobacco: Never   Tobacco comments:    Patient has started back smoking,.  Substance Use Topics   Alcohol use: No   Marital Status: Divorced  ROS  Review of Systems  Cardiovascular:  Positive for palpitations. Negative for chest pain, dyspnea on exertion and leg swelling.    Objective  Blood pressure 102/71, pulse 72, resp. rate 16, height 5\' 6"  (1.676 m), weight 229 lb (103.9 kg).     01/30/2023    4:09 PM 01/28/2022    8:56 AM 07/30/2021    2:16 PM  Vitals with BMI  Height 5\' 6"  5\' 6"  5\' 6"   Weight 229 lbs 237 lbs 235 lbs  BMI 36.98 38.27 37.95  Systolic 102 112 161  Diastolic 71 69 64  Pulse 72 68 68     Physical Exam Neck:     Vascular: No carotid bruit or JVD.  Cardiovascular:     Rate and Rhythm: Normal rate and regular rhythm.     Pulses: Intact distal pulses.     Heart sounds: Normal heart sounds. No murmur heard.    No gallop.  Pulmonary:     Effort: Pulmonary effort is normal.     Breath sounds: Normal breath sounds.  Abdominal:     General: Bowel sounds are normal.     Palpations: Abdomen is soft.   Musculoskeletal:     Right lower leg: No edema.     Left lower leg: No edema.    Laboratory examination:    External labs:   Cholesterol, total 138.000 m 06/07/2021 HDL 46.000 mg 06/07/2021 LDL 75.000 mg 06/07/2021 Triglycerides 83.000 mg 06/07/2021  Hemoglobin 15.000 g/d 02/22/2021 Platelets 229.000 K/ 02/22/2021  Creatinine, Serum 0.720 mg/ 02/22/2021 CrCl Est 72.46 02/22/2021 eGFR 84.800 06/07/2021  Potassium 4.600 mEq 06/07/2021 Magnesium 01/28/2020 ALT (SGPT) 18.000 IU/ 06/07/2021  TSH 1.340 06/07/2021  Radiology:   CT chest 12/17/2018: No acute abnormality is noted in the chest.  Coronary artery calcifications are noted suggesting coronary artery disease. Aortic Atherosclerosis   Cardiac Studies:   Nuclear stress test  [2015]: Normal.  A. Fib Ablation 2015 at [facility].  Event Monitor 30 days 08/28/2017: Paroxysmal sustained A. Fib with RVR - Symptomatic with palpitations. Latest episode was on 09/02/2017.  Echocardiogram 09/02/2017: Left ventricle cavity is normal in size. Moderate concentric hypertrophy of the left ventricle. Low normal LV systolic function. Normal diastolic filling pattern, normal LAP. Left ventricle regional wall motion findings: No wall motion abnormalities. Visual EF is 50-55%. Calculated EF 48%.  Mild tricuspid regurgitation. No evidence of pulmonary hypertension. Patient in sinus rhythm during exam.  Vascular US Lower Extremity Venous Reflux 07/02/2018: Left: Abnormal reflux times were noted in the common femoral vein, great saphenous vein at the saphenofemoral junction, and great saphenous vein at the knee. There is no evidence of deep vein thrombosis in the lower extremity.There is no evidence of superficial venous thrombosis.  EKG:   EKG 01/30/2023: Normal sinus rhythm at rate of 74 bpm, left renal enlargement, otherwise normal EKG.  Compared to 01/28/2022, no significant change.   Allergies  No Known Allergies   Current  Outpatient Medications:    flecainide (TAMBOCOR) 50 MG tablet, TAKE 1 TABLET BY MOUTH TWICE A DAY (Patient taking differently: Take 50 mg by mouth daily.), Disp: 180 tablet, Rfl: 3   metoprolol succinate (TOPROL-XL) 25 MG 24 hr tablet, Take 1 tablet (25 mg total) by mouth daily. Take with or immediately following a meal., Disp: 90 tablet, Rfl: 3   rosuvastatin (CRESTOR) 5 MG tablet, Take 5 mg by mouth daily., Disp: , Rfl:    XARELTO 20 MG TABS tablet, TAKE 1 TABLET BY MOUTH DAILY WITH SUPPER, Disp: 90 tablet, Rfl:  1       Assessment     ICD-10-CM   1. Paroxysmal atrial fibrillation (HCC)  I48.0 EKG 12-Lead    metoprolol succinate (TOPROL-XL) 25 MG 24 hr tablet      Meds ordered this encounter  Medications   metoprolol succinate (TOPROL-XL) 25 MG 24 hr tablet    Sig: Take 1 tablet (25 mg total) by mouth daily. Take with or immediately following a meal.    Dispense:  90 tablet    Refill:  3     Medications Discontinued During This Encounter  Medication Reason   ALPRAZolam (XANAX) 0.5 MG tablet    metoprolol tartrate (LOPRESSOR) 25 MG tablet Change in therapy    This patients CHA2DS2-VASc Score 1 (F) and yearly risk of stroke 0.6%.   Recommendations:   [name]  is a  62 y.o. female with paroxysmal A. Fib diagnosed in  2015 and tried several medications that she did not help. She then underwent successful ablation; however, in Jan 2019 noted to have recurrent A fib with RVR on event monitor and is now on flecainide.   1. Paroxysmal atrial fibrillation (HCC) Patient is presently taking metoprolol tartrate 25 mg 1/2 tablet once a day and flecainide 50 mg once a day as her blood pressure has been very soft and low if she takes it twice a day.  Will change her metoprolol to tartrate to Toprol-XL 25 mg daily.  As she is maintaining sinus rhythm, continue flecainide once a day, she has also been using it on a as needed basis if she were to have recurrence of atrial  fibrillation.  Otherwise she has done well, labs are stable, I will see her back in 1 year.  - EKG 12-Lead - metoprolol succinate (TOPROL-XL) 25 MG 24 hr tablet; Take 1 tablet (25 mg total) by mouth daily. Take with or immediately following a meal.  Dispense: 90 tablet; Refill: 3  Other orders - rosuvastatin (CRESTOR) 5 MG tablet; Take 5 mg by mouth daily.   Yates Decamp, MD, [facility] 01/30/2023, 10:06 PM Office: [phone] Fax: [phone] Pager: [phone]

## 2023-02-04 DIAGNOSIS — F329 Major depressive disorder, single episode, unspecified: Secondary | ICD-10-CM | POA: Diagnosis not present

## 2023-03-07 DIAGNOSIS — J069 Acute upper respiratory infection, unspecified: Secondary | ICD-10-CM | POA: Diagnosis not present

## 2023-03-07 DIAGNOSIS — R051 Acute cough: Secondary | ICD-10-CM | POA: Diagnosis not present

## 2023-03-07 DIAGNOSIS — I4891 Unspecified atrial fibrillation: Secondary | ICD-10-CM | POA: Diagnosis not present

## 2023-03-07 DIAGNOSIS — F17200 Nicotine dependence, unspecified, uncomplicated: Secondary | ICD-10-CM | POA: Diagnosis not present

## 2023-03-07 DIAGNOSIS — D6869 Other thrombophilia: Secondary | ICD-10-CM | POA: Diagnosis not present

## 2023-06-24 ENCOUNTER — Encounter: Payer: Self-pay | Admitting: Cardiology

## 2023-06-24 NOTE — Telephone Encounter (Addendum)
Called pt in regards to AF. Reports since med change from metoprolol tartrate to succinate has noticed increased episodes of AF.   Also reports stopped taking Xarelto about 4 months ago d/t cost can't afford $400 a month for medication.  Takes Aspirin 81 mg PO every day.   Reports HR goes up to 130's at times does not stay.  Has intermittent AF that wakes her from sleep. Has SOB but does not know if it is AF r/t or that she quit smoking about 4 months ago.  Does not check BP at home reports runs low.  Takes Flecainide and metoprolol as ordered. Gave pt the number for Xarelto patient assistance.  Scheduled OV with Dr. Jimmey Ralph DOD 06/25/23 at 11:15 am.  Advised if HR goes up to 130's and stays needs ED evaluation pt expresses understanding.

## 2023-06-24 NOTE — Progress Notes (Signed)
Electrophysiology Office Note:   Date:  06/25/2023  ID:  [name], DOB 05-25-1961, MRN [id number]  Primary Cardiologist: None Electrophysiologist: None      History of Present Illness:   [name] is a 63 y.o. female with h/o hyperlipidemia and paroxysmal atrial fibrillation s/p ablation 02/22/2014 who is seen today for evaluation of her atrial fibrillation at the request of Dr. Nadara Eaton.  Patient has failed multiple anti-arrhythmic medications. Did well for years after ablation then had a recurrence after her dad passed away in October 08, 2016. She then was placed on flecainide and metoprolol. Had recurrence earlier this year after stopping smoking. She has been going in and out of atrial fibrillation frequently over the past few months. She is very symptomatic during her episodes stating that she feels terrible with palpitations and fatigue.  She thinks that her atrial fibrillation had been better controlled when she was on flecainide 100 mg twice daily.  Review of systems complete and found to be negative unless listed in HPI.   EP Information / Studies Reviewed:    EKG is ordered today. Personal review as below.  EKG Interpretation Date/Time:  Wednesday June 25 2023 11:25:50 EDT Ventricular Rate:  61 PR Interval:  184 QRS Duration:  90 QT Interval:  434 QTC Calculation: 436 R Axis:   24  Text Interpretation: Normal sinus rhythm Normal ECG When compared with ECG of 22-Feb-2021 16:08, Vent. rate has decreased BY  30 BPMotherwise no significant change. Confirmed by [name] [phone]) on 06/25/2023 12:27:00 PM   Treadmill Exercise Stress 01/04/2019:  Overall Impression: Normal stress test. BP Response to Exercise: Normal resting BP- appropriate response. Exercise capacity was below average for age.    Risk Assessment/Calculations:    CHA2DS2-VASc Score = 1   This indicates a 0.6% annual risk of stroke. The patient's score is based upon: CHF History: 0 HTN History: 0 Diabetes  History: 0 Stroke History: 0 Vascular Disease History: 0 Age Score: 0 Gender Score: 1              Physical Exam:   VS:  BP 96/60   Pulse 73   Ht 5\' 6"  (1.676 m)   Wt 231 lb 9.6 oz (105.1 kg)   SpO2 96%   BMI 37.38 kg/m    Wt Readings from Last 3 Encounters:  06/25/23 231 lb 9.6 oz (105.1 kg)  01/30/23 229 lb (103.9 kg)  01/28/22 237 lb (107.5 kg)     GEN: Well nourished, well developed in no acute distress NECK: No JVD; No carotid bruits CARDIAC: Normal rate and regular rhythm. RESPIRATORY:  Clear to auscultation without rales, wheezing or rhonchi  ABDOMEN: Soft, non-tender, non-distended EXTREMITIES:  No edema; No deformity   ASSESSMENT AND PLAN:   [name] is a 62 y.o. female with h/o hyperlipidemia and paroxysmal atrial fibrillation s/p ablation 02/22/2014 who is seen today for evaluation of her atrial fibrillation at the request of Dr. Nadara Eaton.  #Paroxysmal atrial fibrillation: Symptomatic. -Increase flecainide to 100 mg twice daily.  Repeat EKG in 7 days. -Increase metoprolol XL from 25 mg once daily to twice daily. -Zio monitor to assess overall burden of atrial fibrillation.  Believes that we will be able to adequately see episodes with a 2-week monitor.  There are no EKGs demonstrating atrial fibrillation at this time. -If patient continues to have frequent episodes atrial fibrillation despite increasing flecainide then I think she would be a good candidate for redo catheter ablation.  We briefly discussed this during  our visit today.  #Secondary hypercoagulable state due to atrial fibrillation:  -CHADSVASC score of 1. -Continue Xarelto.  Follow up with Dr. Jimmey Ralph  in 6-8 weeks.   Signed, Toni Putnam, MD

## 2023-06-25 ENCOUNTER — Ambulatory Visit: Payer: 59

## 2023-06-25 ENCOUNTER — Ambulatory Visit: Payer: 59 | Attending: Cardiology | Admitting: Cardiology

## 2023-06-25 ENCOUNTER — Encounter: Payer: Self-pay | Admitting: Cardiology

## 2023-06-25 VITALS — BP 96/60 | HR 73 | Ht 66.0 in | Wt 231.6 lb

## 2023-06-25 DIAGNOSIS — I480 Paroxysmal atrial fibrillation: Secondary | ICD-10-CM

## 2023-06-25 DIAGNOSIS — D6869 Other thrombophilia: Secondary | ICD-10-CM | POA: Diagnosis not present

## 2023-06-25 MED ORDER — XARELTO 20 MG PO TABS
20.0000 mg | ORAL_TABLET | Freq: Every day | ORAL | 3 refills | Status: DC
Start: 2023-06-25 — End: 2024-02-02

## 2023-06-25 MED ORDER — RIVAROXABAN 20 MG PO TABS: 20.0000 mg | ORAL_TABLET | Freq: Every day | ORAL | Status: DC

## 2023-06-25 MED ORDER — FLECAINIDE ACETATE 100 MG PO TABS: 100.0000 mg | ORAL_TABLET | Freq: Two times a day (BID) | ORAL | 3 refills | Status: AC

## 2023-06-25 MED ORDER — METOPROLOL SUCCINATE ER 25 MG PO TB24: 25.0000 mg | ORAL_TABLET | Freq: Two times a day (BID) | ORAL | 3 refills | Status: DC

## 2023-06-25 NOTE — Patient Instructions (Addendum)
Medication Instructions:  INCREASE Flecainide to 100 mg twice daily  INCREASE Metoprolol Succinate XL to 25 mg TWICE daily *If you need a refill on your cardiac medications before your next appointment, please call your pharmacy*   Testing/Procedures: Zio Cardiac Monitor Your physician has recommended that you wear an event monitor. Event monitors are medical devices that record the heart's electrical activity. Doctors most often [place] these monitors to diagnose arrhythmias. Arrhythmias are problems with the speed or rhythm of the heartbeat. The monitor is a small, portable device. You can wear one while you do your normal daily activities. This is usually used to diagnose what is causing palpitations/syncope (passing out).   Follow-Up: At [name], you and your health needs are our priority.  As part of our continuing mission to provide you with exceptional heart care, we have created designated Provider Care Teams.  These Care Teams include your primary Cardiologist (physician) and Advanced Practice Providers (APPs -  Physician Assistants and Nurse Practitioners) who all work together to provide you with the care you need, when you need it.  We recommend signing up for the patient portal called "MyChart".  Sign up information is provided on this After Visit Summary.  MyChart is used to connect with patients for Virtual Visits (Telemedicine).  Patients are able to view lab/test results, encounter notes, upcoming appointments, etc.  Non-urgent messages can be sent to your provider as well.   To learn more about what you can do with MyChart, go to [email or web address].    Your next appointment:   6 week(s)  Provider:   Nobie Putnam, MD   [name]- Long Term Monitor Instructions  Your physician has requested you wear a ZIO patch monitor for 14 days.  This is a single patch monitor. [employer] supplies one patch monitor per enrollment. Additional stickers are not available. Please do  not apply patch if you will be having a Nuclear Stress Test,  Echocardiogram, Cardiac CT, MRI, or Chest Xray during the period you would be wearing the  monitor. The patch cannot be worn during these tests. You cannot remove and re-apply the  ZIO XT patch monitor.  Your ZIO patch monitor will be mailed 3 day USPS to your address on file. It may take 3-5 days  to receive your monitor after you have been enrolled.  Once you have received your monitor, please review the enclosed instructions. Your monitor  has already been registered assigning a specific monitor serial # to you.  Billing and Patient Assistance Program Information  We have supplied [employer] with any of your insurance information on file for billing purposes. [employer] offers a sliding scale Patient Assistance Program for patients that do not have  insurance, or whose insurance does not completely cover the cost of the ZIO monitor.  You must apply for the Patient Assistance Program to qualify for this discounted rate.  To apply, please call [employer] at [phone], select option 4, select option 2, ask to apply for  Patient Assistance Program. Meredeth Ide will ask your household income, and how many people  are in your household. They will quote your out-of-pocket cost based on that information.  [employer] will also be able to set up a 20-month, interest-free payment plan if needed.  Applying the monitor   Shave hair from upper left chest.  Hold abrader disc by orange tab. Rub abrader in 40 strokes over the upper left chest as  indicated in your monitor instructions.  Clean area with 4 enclosed  alcohol pads. Let dry.  Apply patch as indicated in monitor instructions. Patch will be placed under collarbone on left  side of chest with arrow pointing upward.  Rub patch adhesive wings for 2 minutes. Remove white label marked "1". Remove the white  label marked "2". Rub patch adhesive wings for 2 additional minutes.  While looking in a  mirror, press and release button in [facility] of patch. A small green light will  flash 3-4 times. This will be your only indicator that the monitor has been turned on.  Do not shower for the first 24 hours. You may shower after the first 24 hours.  Press the button if you feel a symptom. You will hear a small click. Record Date, Time and  Symptom in the Patient Logbook.  When you are ready to remove the patch, follow instructions on the last 2 pages of Patient  Logbook. Stick patch monitor onto the last page of Patient Logbook.  Place Patient Logbook in the blue and white box. Use locking tab on box and tape box closed  securely. The blue and white box has prepaid postage on it. Please place it in the mailbox as  soon as possible. Your physician should have your test results approximately 7 days after the  monitor has been mailed back to [facility].  Call [facility] Customer Care at [phone] if you have questions regarding  your ZIO XT patch monitor. Call them immediately if you see an orange light blinking on your  monitor.  If your monitor falls off in less than 4 days, contact our Monitor department at [phone].  If your monitor becomes loose or falls off after 4 days call [employer] at [phone] for  suggestions on securing your monitor

## 2023-06-25 NOTE — Progress Notes (Unsigned)
Enrolled for [employer] to mail a ZIO XT long term holter monitor to the patients address on file.  

## 2023-07-07 ENCOUNTER — Ambulatory Visit: Payer: 59

## 2023-07-15 ENCOUNTER — Telehealth: Payer: Self-pay | Admitting: Cardiology

## 2023-07-15 ENCOUNTER — Ambulatory Visit: Payer: 59

## 2023-07-15 NOTE — Telephone Encounter (Signed)
Patient cancelled nurse visit for repeat EKG today, states "I feel like I'm coming down with something and don't want to give anyone cooties."  Offered next available Nurse Visit appt on Friday 07/18/23 at 2pm--patient accepted, appt scheduled.

## 2023-07-15 NOTE — Telephone Encounter (Signed)
Patient cancelled nurse visit scheduled for today because she is not feeling well. She would like a call back to reschedule.

## 2023-07-18 ENCOUNTER — Ambulatory Visit: Payer: 59 | Attending: Internal Medicine

## 2023-07-21 NOTE — Telephone Encounter (Signed)
Patient did now show up to her nurse visit on 11/22.  Called patient to reschedule. Left message for her to call back.

## 2023-07-23 NOTE — Telephone Encounter (Signed)
Spoke with the patient and she will come in on 12/3 for an EKG.

## 2023-07-29 ENCOUNTER — Ambulatory Visit: Payer: 59 | Attending: Internal Medicine

## 2023-07-29 VITALS — HR 71 | Ht 66.0 in | Wt 235.0 lb

## 2023-07-29 DIAGNOSIS — I480 Paroxysmal atrial fibrillation: Secondary | ICD-10-CM

## 2023-07-29 NOTE — Patient Instructions (Signed)
Medication Instructions:  Your physician recommends that you continue on your current medications as directed. Please refer to the Current Medication list given to you today.  *If you need a refill on your cardiac medications before your next appointment, please call your pharmacy*  Lab Work: None ordered today. If you have labs (blood work) drawn today and your tests are completely normal, you will receive your results only by: MyChart Message (if you have MyChart) OR A paper copy in the mail If you have any lab test that is abnormal or we need to change your treatment, we will call you to review the results.  Testing/Procedures: None ordered today.  Follow-Up: At [facility], you and your health needs are our priority.  As part of our continuing mission to provide you with exceptional heart care, we have created designated Provider Care Teams.  These Care Teams include your primary Cardiologist (physician) and Advanced Practice Providers (APPs -  Physician Assistants and Nurse Practitioners) who all work together to provide you with the care you need, when you need it.  We recommend signing up for the patient portal called "MyChart".  Sign up information is provided on this After Visit Summary.  MyChart is used to connect with patients for Virtual Visits (Telemedicine).  Patients are able to view lab/test results, encounter notes, upcoming appointments, etc.  Non-urgent messages can be sent to your provider as well.   To learn more about what you can do with MyChart, go to [email or web address].

## 2023-07-29 NOTE — Progress Notes (Signed)
   Nurse Visit   Date of Encounter: 07/29/2023 ID: [name], DOB [birth year], MRN [id number]  PCP:  [name], MD   [facility] Providers Cardiologist:  None      Visit Details   VS:  Pulse 71   Ht 5\' 6"  (1.676 m)   Wt 235 lb (106.6 kg)   BMI 37.93 kg/m  , BMI Body mass index is 37.93 kg/m.  Wt Readings from Last 3 Encounters:  07/29/23 235 lb (106.6 kg)  06/25/23 231 lb 9.6 oz (105.1 kg)  01/30/23 229 lb (103.9 kg)     Reason for visit: EKG - post-flecainide dose increase  Performed today: Vitals, EKG, Provider consulted:Dr. Lewayne Bunting, and Education Changes (medications, testing, etc.) : None Length of Visit: 10 minutes    Medications Adjustments/Labs and Tests Ordered: Orders Placed This Encounter  Procedures   EKG 12-Lead   EKG 12-Lead   No orders of the defined types were placed in this encounter.    Toni Mart, RN  07/29/2023 3:26 PM

## 2023-08-17 NOTE — Progress Notes (Deleted)
  Electrophysiology Office Note:   Date:  08/17/2023  ID:  [name], DOB January 25, 1961, MRN [id number]  Primary Cardiologist: None Primary Heart Failure: None Electrophysiologist: Nobie Putnam, MD  {Click to update primary MD,subspecialty MD or APP then REFRESH:1}    History of Present Illness:   [name] is a 62 y.o. female with h/o hyperlipidemia and paroxysmal atrial fibrillation s/p ablation 02/22/2014 who is seen today for routine electrophysiology followup.   Since last being seen in our clinic the patient reports doing ***.  She denies chest pain, palpitations, dyspnea, PND, orthopnea, nausea, vomiting, dizziness, syncope, edema, weight gain, or early satiety.   Review of systems complete and found to be negative unless listed in HPI.   EP Information / Studies Reviewed:    EKG is not ordered today. EKG from 07/29/23 reviewed which showed normal sinus rhythm. PR , QRS 88ms.      Treadmill Exercise Stress 01/04/2019:  Overall Impression: Normal stress test. BP Response to Exercise: Normal resting BP- appropriate response. Exercise capacity was below average for age.    Risk Assessment/Calculations:    CHA2DS2-VASc Score = 1  {Confirm score is correct.  If not, click here to update score.  REFRESH note.  :1} This indicates a 0.6% annual risk of stroke. The patient's score is based upon: CHF History: 0 HTN History: 0 Diabetes History: 0 Stroke History: 0 Vascular Disease History: 0 Age Score: 0 Gender Score: 1     No BP recorded.  {Refresh Note OR Click here to enter BP  :1}***        Physical Exam:   VS:  There were no vitals taken for this visit.   Wt Readings from Last 3 Encounters:  07/29/23 235 lb (106.6 kg)  06/25/23 231 lb 9.6 oz (105.1 kg)  01/30/23 229 lb (103.9 kg)     GEN: Well nourished, well developed in no acute distress NECK: No JVD CARDIAC: {EPRHYTHM:28826}, no murmurs, rubs, gallops RESPIRATORY:  Clear to auscultation without rales,  wheezing or rhonchi  ABDOMEN: Soft, non-tender, non-distended EXTREMITIES:  No edema; No deformity   ASSESSMENT AND PLAN:   [name] is a 62 y.o. female with h/o hyperlipidemia and paroxysmal atrial fibrillation s/p ablation 02/22/2014 who is seen today for evaluation of her atrial fibrillation at the request of Dr. Nadara Eaton.   #Paroxysmal atrial fibrillation: Symptomatic. -Increase flecainide to 100 mg twice daily.  Repeat EKG in 7 days. -Increase metoprolol XL from 25 mg once daily to twice daily. -Zio monitor to assess overall burden of atrial fibrillation.  Believes that we will be able to adequately see episodes with a 2-week monitor.  There are no EKGs demonstrating atrial fibrillation at this time. -If patient continues to have frequent episodes atrial fibrillation despite increasing flecainide then I think she would be a good candidate for redo catheter ablation.  We briefly discussed this during our visit today.   #Secondary hypercoagulable state due to atrial fibrillation:  -CHADSVASC score of 1. -Continue Xarelto.  Follow up with {MVHQI:69629} {EPFOLLOW BM:84132}  Signed, Nobie Putnam, MD

## 2023-08-18 ENCOUNTER — Ambulatory Visit: Payer: 59 | Admitting: Cardiology

## 2023-08-26 DIAGNOSIS — I4891 Unspecified atrial fibrillation: Secondary | ICD-10-CM | POA: Diagnosis not present

## 2023-08-26 DIAGNOSIS — M8580 Other specified disorders of bone density and structure, unspecified site: Secondary | ICD-10-CM | POA: Diagnosis not present

## 2023-08-26 DIAGNOSIS — Z0000 Encounter for general adult medical examination without abnormal findings: Secondary | ICD-10-CM | POA: Diagnosis not present

## 2023-08-26 DIAGNOSIS — E559 Vitamin D deficiency, unspecified: Secondary | ICD-10-CM | POA: Diagnosis not present

## 2023-09-09 DIAGNOSIS — F330 Major depressive disorder, recurrent, mild: Secondary | ICD-10-CM | POA: Diagnosis not present

## 2023-09-09 DIAGNOSIS — R8290 Unspecified abnormal findings in urine: Secondary | ICD-10-CM | POA: Diagnosis not present

## 2023-09-09 DIAGNOSIS — F17200 Nicotine dependence, unspecified, uncomplicated: Secondary | ICD-10-CM | POA: Diagnosis not present

## 2023-09-09 DIAGNOSIS — E782 Mixed hyperlipidemia: Secondary | ICD-10-CM | POA: Diagnosis not present

## 2023-09-09 DIAGNOSIS — Z0000 Encounter for general adult medical examination without abnormal findings: Secondary | ICD-10-CM | POA: Diagnosis not present

## 2023-09-09 DIAGNOSIS — I4891 Unspecified atrial fibrillation: Secondary | ICD-10-CM | POA: Diagnosis not present

## 2023-09-09 DIAGNOSIS — G250 Essential tremor: Secondary | ICD-10-CM | POA: Diagnosis not present

## 2023-09-09 DIAGNOSIS — J441 Chronic obstructive pulmonary disease with (acute) exacerbation: Secondary | ICD-10-CM | POA: Diagnosis not present

## 2023-09-09 DIAGNOSIS — I7300 Raynaud's syndrome without gangrene: Secondary | ICD-10-CM | POA: Diagnosis not present

## 2023-09-09 DIAGNOSIS — I700 Atherosclerosis of aorta: Secondary | ICD-10-CM | POA: Diagnosis not present

## 2023-09-09 DIAGNOSIS — D6869 Other thrombophilia: Secondary | ICD-10-CM | POA: Diagnosis not present

## 2023-09-28 NOTE — Progress Notes (Deleted)
  Electrophysiology Office Note:   Date:  09/28/2023  ID:  [name], DOB [birth year], MRN [id number]  Primary Cardiologist: None Electrophysiologist: Nobie Putnam, MD  {Click to update primary MD,subspecialty MD or APP then REFRESH:1}    History of Present Illness:   [name] is a 63 y.o. female with h/o hyperlipidemia and paroxysmal atrial fibrillation s/p ablation 02/22/2014 who is seen today for evaluation of her atrial fibrillation.  Patient has failed multiple anti-arrhythmic medications. Did well for years after ablation then had a recurrence after her dad passed away in October 22, 2016. She then was placed on flecainide and metoprolol. Had recurrence earlier this year after stopping smoking. She has been going in and out of atrial fibrillation frequently over the past few months. She is very symptomatic during her episodes stating that she feels terrible with palpitations and fatigue.  She thinks that her atrial fibrillation had been better controlled when she was on flecainide 100 mg twice daily.   Discussed the use of AI scribe software for clinical note transcription with the patient, who gave verbal consent to proceed.  History of Present Illness            Review of systems complete and found to be negative unless listed in HPI.   EP Information / Studies Reviewed:    {EKGtoday:28818}      ***  Risk Assessment/Calculations:    CHA2DS2-VASc Score = 1  {Confirm score is correct.  If not, click here to update score.  REFRESH note.  :1} This indicates a 0.6% annual risk of stroke. The patient's score is based upon: CHF History: 0 HTN History: 0 Diabetes History: 0 Stroke History: 0 Vascular Disease History: 0 Age Score: 0 Gender Score: 1     No BP recorded.  {Refresh Note OR Click here to enter BP  :1}***        Physical Exam:   VS:  There were no vitals taken for this visit.   Wt Readings from Last 3 Encounters:  07/29/23 235 lb (106.6 kg)  06/25/23 231 lb 9.6  oz (105.1 kg)  01/30/23 229 lb (103.9 kg)     GEN: Well nourished, well developed in no acute distress NECK: No JVD; No carotid bruits CARDIAC: {EPRHYTHM:28826}, no murmurs, rubs, gallops RESPIRATORY:  Clear to auscultation without rales, wheezing or rhonchi  ABDOMEN: Soft, non-tender, non-distended EXTREMITIES:  No edema; No deformity   ASSESSMENT AND PLAN:   [name] is a 63 y.o. female with h/o hyperlipidemia and paroxysmal atrial fibrillation s/p ablation 02/22/2014 who is seen today for evaluation of her atrial fibrillation at the request of Dr. Nadara Eaton.   #Paroxysmal atrial fibrillation: Symptomatic. -Increase flecainide to 100 mg twice daily.  Repeat EKG in 7 days. -Increase metoprolol XL from 25 mg once daily to twice daily. -Zio monitor to assess overall burden of atrial fibrillation.  Believes that we will be able to adequately see episodes with a 2-week monitor.  There are no EKGs demonstrating atrial fibrillation at this time. -If patient continues to have frequent episodes atrial fibrillation despite increasing flecainide then I think she would be a good candidate for redo catheter ablation.  We briefly discussed this during our visit today.   #Secondary hypercoagulable state due to atrial fibrillation:  -CHADSVASC score of 1. -Continue Xarelto.   Follow up with {NWGNF:62130} {EPFOLLOW QM:57846}  Signed, Nobie Putnam, MD

## 2023-09-29 ENCOUNTER — Ambulatory Visit: Payer: 59 | Attending: Cardiology | Admitting: Cardiology

## 2023-09-30 ENCOUNTER — Encounter: Payer: Self-pay | Admitting: Cardiology

## 2023-11-18 DIAGNOSIS — R3915 Urgency of urination: Secondary | ICD-10-CM | POA: Diagnosis not present

## 2023-11-18 DIAGNOSIS — F330 Major depressive disorder, recurrent, mild: Secondary | ICD-10-CM | POA: Diagnosis not present

## 2023-11-18 DIAGNOSIS — F17200 Nicotine dependence, unspecified, uncomplicated: Secondary | ICD-10-CM | POA: Diagnosis not present

## 2023-11-18 DIAGNOSIS — J441 Chronic obstructive pulmonary disease with (acute) exacerbation: Secondary | ICD-10-CM | POA: Diagnosis not present

## 2023-11-18 DIAGNOSIS — R8290 Unspecified abnormal findings in urine: Secondary | ICD-10-CM | POA: Diagnosis not present

## 2023-11-18 DIAGNOSIS — R5383 Other fatigue: Secondary | ICD-10-CM | POA: Diagnosis not present

## 2023-11-18 DIAGNOSIS — I4891 Unspecified atrial fibrillation: Secondary | ICD-10-CM | POA: Diagnosis not present

## 2023-11-18 DIAGNOSIS — R3129 Other microscopic hematuria: Secondary | ICD-10-CM | POA: Diagnosis not present

## 2023-11-18 DIAGNOSIS — E782 Mixed hyperlipidemia: Secondary | ICD-10-CM | POA: Diagnosis not present

## 2023-11-18 DIAGNOSIS — L309 Dermatitis, unspecified: Secondary | ICD-10-CM | POA: Diagnosis not present

## 2023-12-03 DIAGNOSIS — N3946 Mixed incontinence: Secondary | ICD-10-CM | POA: Diagnosis not present

## 2023-12-03 DIAGNOSIS — R351 Nocturia: Secondary | ICD-10-CM | POA: Diagnosis not present

## 2023-12-03 DIAGNOSIS — R8271 Bacteriuria: Secondary | ICD-10-CM | POA: Diagnosis not present

## 2023-12-03 DIAGNOSIS — N3020 Other chronic cystitis without hematuria: Secondary | ICD-10-CM | POA: Diagnosis not present

## 2024-01-02 DIAGNOSIS — Z01419 Encounter for gynecological examination (general) (routine) without abnormal findings: Secondary | ICD-10-CM | POA: Diagnosis not present

## 2024-01-02 DIAGNOSIS — Z1231 Encounter for screening mammogram for malignant neoplasm of breast: Secondary | ICD-10-CM | POA: Diagnosis not present

## 2024-01-02 DIAGNOSIS — R32 Unspecified urinary incontinence: Secondary | ICD-10-CM | POA: Diagnosis not present

## 2024-01-02 DIAGNOSIS — B372 Candidiasis of skin and nail: Secondary | ICD-10-CM | POA: Diagnosis not present

## 2024-01-02 DIAGNOSIS — M8580 Other specified disorders of bone density and structure, unspecified site: Secondary | ICD-10-CM | POA: Diagnosis not present

## 2024-01-02 DIAGNOSIS — Z6837 Body mass index (BMI) 37.0-37.9, adult: Secondary | ICD-10-CM | POA: Diagnosis not present

## 2024-01-08 DIAGNOSIS — K5792 Diverticulitis of intestine, part unspecified, without perforation or abscess without bleeding: Secondary | ICD-10-CM | POA: Diagnosis not present

## 2024-01-08 DIAGNOSIS — R509 Fever, unspecified: Secondary | ICD-10-CM | POA: Diagnosis not present

## 2024-01-08 DIAGNOSIS — I4891 Unspecified atrial fibrillation: Secondary | ICD-10-CM | POA: Diagnosis not present

## 2024-01-08 DIAGNOSIS — R1030 Lower abdominal pain, unspecified: Secondary | ICD-10-CM | POA: Diagnosis not present

## 2024-01-29 ENCOUNTER — Ambulatory Visit: Payer: 59 | Admitting: Cardiology

## 2024-02-02 ENCOUNTER — Ambulatory Visit: Payer: 59 | Attending: Cardiology | Admitting: Cardiology

## 2024-02-02 ENCOUNTER — Encounter: Payer: Self-pay | Admitting: Cardiology

## 2024-02-02 VITALS — BP 94/64 | HR 76 | Ht 66.0 in | Wt 232.0 lb

## 2024-02-02 DIAGNOSIS — Z5181 Encounter for therapeutic drug level monitoring: Secondary | ICD-10-CM

## 2024-02-02 DIAGNOSIS — I480 Paroxysmal atrial fibrillation: Secondary | ICD-10-CM | POA: Diagnosis not present

## 2024-02-02 NOTE — Patient Instructions (Addendum)
 Medication Instructions:  The current medical regimen is effective;  continue present plan and medications.  *If you need a refill on your cardiac medications before your next appointment, please call your pharmacy*  Follow-Up: At [facility], you and your health needs are our priority.  As part of our continuing mission to provide you with exceptional heart care, our providers are all part of one team.  This team includes your primary Cardiologist (physician) and Advanced Practice Providers or APPs (Physician Assistants and Nurse Practitioners) who all work together to provide you with the care you need, when you need it.  Your next appointment:   1 year(s)  Provider:       We recommend signing up for the patient portal called "MyChart".  Sign up information is provided on this After Visit Summary.  MyChart is used to connect with patients for Virtual Visits (Telemedicine).  Patients are able to view lab/test results, encounter notes, upcoming appointments, etc.  Non-urgent messages can be sent to your provider as well.   To learn more about what you can do with MyChart, go to [email or web address].    Voltaren 

## 2024-02-02 NOTE — Progress Notes (Signed)
 Cardiology Office Note:  .   Date:  02/02/2024  ID:  [name], DOB 08-24-1961, MRN [id number] PCP: Barnetta Liberty, MD  [facility] Providers Cardiologist:  None Electrophysiologist:  Ardeen Kohler, MD   History of Present Illness: [name]   [name] is a 63 y.o. female with paroxysmal A. Fib diagnosed in  2015 and tried several medications that she did not help. She then underwent successful ablation in 2015 at [facility]; however, in Jan 2019 noted to have recurrent A fib with RVR on event monitor and is now on flecainide .  She has no history of hypertension or vascular disease.  She has been evaluated by Dr. Ardeen Kohler from EP standpoint and had increased PacMed 200 mg twice daily in view of recurrent symptoms of PAF.  She also has chronic venous insufficiency involving deep femoral vein on the left hence medical therapy recommended.  This is annual visit.  Discussed the use of AI scribe software for clinical note transcription with the patient, who gave verbal consent to proceed.  History of Present Illness [name] is a 63 year old female with atrial fibrillation who presents for follow-up regarding her medication regimen.  She experiences more frequent episodes of atrial fibrillation with reduced medication doses. A lower dose of flecainide  was previously ineffective, leading to increased episodes. Currently, she takes flecainide  100 mg once daily, which has controlled her atrial fibrillation without episodes for the past eight to nine months. She prefers the once-daily regimen as twice-daily dosing causes fatigue. She carries extra medication for stress-related episodes but has not needed it recently.  She is also on metoprolol  succinate and Xarelto , taken at night. She must avoid certain medications with flecainide , such as ciprofloxacin , which requires a five-hour separation due to interactions. A recent bout of diverticulitis required careful medication  timing.   Labs   Lab Results  Component Value Date   TSH 2.000 11/20/2018    External Labs:  [facility] labs 09/09/2023:  Total cholesterol 119, triglycerides HDL 53, LDL 49.  TSH normal at 1.910.  ROS  Review of Systems  Cardiovascular:  Positive for palpitations (Occasional). Negative for chest pain, dyspnea on exertion and leg swelling.    Physical Exam:   VS:  BP 94/64 (BP Location: Left Arm, Patient Position: Sitting, Cuff Size: Normal)   Pulse 76   Ht 5\' 6"  (1.676 m)   Wt 232 lb (105.2 kg)   SpO2 94%   BMI 37.45 kg/m    Wt Readings from Last 3 Encounters:  02/02/24 232 lb (105.2 kg)  07/29/23 235 lb (106.6 kg)  06/25/23 231 lb 9.6 oz (105.1 kg)    Physical Exam Constitutional:      Appearance: She is obese.  Neck:     Vascular: No carotid bruit or JVD.  Cardiovascular:     Rate and Rhythm: Normal rate and regular rhythm.     Pulses: Intact distal pulses.     Heart sounds: Normal heart sounds. No murmur heard.    No gallop.  Pulmonary:     Effort: Pulmonary effort is normal.     Breath sounds: Normal breath sounds.  Abdominal:     General: Bowel sounds are normal.     Palpations: Abdomen is soft.  Musculoskeletal:     Right lower leg: No edema.     Left lower leg: No edema.    Studies Reviewed: .    NA EKG:         Medications and allergies  No Known Allergies   Current Outpatient Medications:    cholecalciferol (VITAMIN D3) 25 MCG (1000 UNIT) tablet, Take 1,000 Units by mouth daily., Disp: , Rfl:    flecainide  (TAMBOCOR ) 100 MG tablet, Take 1 tablet (100 mg total) by mouth 2 (two) times daily., Disp: 180 tablet, Rfl: 3   metoprolol  succinate (TOPROL  XL) 25 MG 24 hr tablet, Take 1 tablet (25 mg total) by mouth 2 (two) times daily., Disp: 180 tablet, Rfl: 3   rivaroxaban  (XARELTO ) 20 MG TABS tablet, Take 1 tablet (20 mg total) by mouth daily with supper., Disp: , Rfl:    rosuvastatin (CRESTOR) 5 MG tablet, Take 5 mg by mouth daily., Disp: , Rfl:     No orders of the defined types were placed in this encounter.    Medications Discontinued During This Encounter  Medication Reason   XARELTO  20 MG TABS tablet Dose change     ASSESSMENT AND PLAN: .      ICD-10-CM   1. Paroxysmal atrial fibrillation (HCC)  I48.0       Assessment and Plan Assessment & Plan Atrial Fibrillation Atrial fibrillation is well-controlled with flecainide  100 mg once daily, despite it being a twice-daily medication. She experiences no episodes of AFib and reports adverse effects when taking it twice daily. She carries extra flecainide  for stress-induced episodes but has not needed it in 8-9 months. Her cardiovascular status is stable with no coronary artery disease or hypertension. Lipid profile is within normal limits. - Continue flecainide  100 mg once daily. - Continue metoprolol  succinate once daily. - Continue Xarelto  as prescribed. - Follow up in one year for cardiovascular evaluation.  Diverticulitis Recent episode of diverticulitis. Ciprofloxacin  was advised with a 5-hour interval from flecainide  to prevent potential drug interaction affecting cardiac conduction. - Ensure ciprofloxacin  is taken 5 hours apart from flecainide  if prescribed again.  This is in view of QT prolongation effects.  Achilles Tendonitis Recent onset of Achilles tendonitis with swelling and pain at the posterior heel. Conservative management advised due to likely overuse or strain and obesity. - Apply Voltaren  gel frequently to reduce inflammation. - Rest the affected foot as much as possible.  I will see her back in a year or sooner if problems.   Signed,  Knox Perl, MD, [facility] 02/02/2024, 4:06 PM [facility] [address] [place], [place] [address] Phone: [phone]. Fax:  [phone]

## 2024-02-08 DIAGNOSIS — S62307A Unspecified fracture of fifth metacarpal bone, left hand, initial encounter for closed fracture: Secondary | ICD-10-CM | POA: Diagnosis not present

## 2024-02-10 DIAGNOSIS — S62356A Nondisplaced fracture of shaft of fifth metacarpal bone, right hand, initial encounter for closed fracture: Secondary | ICD-10-CM | POA: Diagnosis not present

## 2024-02-17 DIAGNOSIS — S62356A Nondisplaced fracture of shaft of fifth metacarpal bone, right hand, initial encounter for closed fracture: Secondary | ICD-10-CM | POA: Diagnosis not present

## 2024-02-19 ENCOUNTER — Other Ambulatory Visit: Payer: Self-pay

## 2024-02-19 DIAGNOSIS — I480 Paroxysmal atrial fibrillation: Secondary | ICD-10-CM

## 2024-02-19 MED ORDER — RIVAROXABAN 20 MG PO TABS: 20.0000 mg | ORAL_TABLET | Freq: Every day | ORAL | 1 refills | Status: DC

## 2024-02-19 NOTE — Telephone Encounter (Signed)
 Prescription refill request for Xarelto  received.  Indication: Afib  Last office visit: 02/02/24 [place] Weight: 105.2kg Age: 63 Scr: 0.9 (09/09/23 via [facility]) CrCl: 103.69ml/min  Appropriate dose. Refill sent.

## 2024-02-27 ENCOUNTER — Other Ambulatory Visit: Payer: Self-pay | Admitting: Cardiology

## 2024-02-27 DIAGNOSIS — I480 Paroxysmal atrial fibrillation: Secondary | ICD-10-CM

## 2024-03-01 ENCOUNTER — Encounter: Payer: Self-pay | Admitting: Cardiology

## 2024-03-01 MED ORDER — METOPROLOL SUCCINATE ER 25 MG PO TB24: 25.0000 mg | ORAL_TABLET | Freq: Every day | ORAL | 2 refills | Status: AC

## 2024-03-01 NOTE — Telephone Encounter (Signed)
 Pt confirms she is taking Toprol  once daily at bedtime, also confirmed in 6/9 OV note by Dr. Ladona.. Sent Rx

## 2024-04-09 DIAGNOSIS — H8111 Benign paroxysmal vertigo, right ear: Secondary | ICD-10-CM | POA: Diagnosis not present

## 2024-04-09 DIAGNOSIS — F330 Major depressive disorder, recurrent, mild: Secondary | ICD-10-CM | POA: Diagnosis not present

## 2024-04-09 DIAGNOSIS — E782 Mixed hyperlipidemia: Secondary | ICD-10-CM | POA: Diagnosis not present

## 2024-04-09 DIAGNOSIS — H6121 Impacted cerumen, right ear: Secondary | ICD-10-CM | POA: Diagnosis not present

## 2024-04-09 DIAGNOSIS — R42 Dizziness and giddiness: Secondary | ICD-10-CM | POA: Diagnosis not present

## 2024-04-09 DIAGNOSIS — I4891 Unspecified atrial fibrillation: Secondary | ICD-10-CM | POA: Diagnosis not present

## 2024-05-08 DIAGNOSIS — R0981 Nasal congestion: Secondary | ICD-10-CM | POA: Diagnosis not present

## 2024-05-08 DIAGNOSIS — U071 COVID-19: Secondary | ICD-10-CM | POA: Diagnosis not present

## 2024-06-08 DIAGNOSIS — H5203 Hypermetropia, bilateral: Secondary | ICD-10-CM | POA: Diagnosis not present

## 2024-06-08 DIAGNOSIS — H2513 Age-related nuclear cataract, bilateral: Secondary | ICD-10-CM | POA: Diagnosis not present

## 2024-07-14 DIAGNOSIS — R0981 Nasal congestion: Secondary | ICD-10-CM | POA: Diagnosis not present

## 2024-07-14 DIAGNOSIS — J441 Chronic obstructive pulmonary disease with (acute) exacerbation: Secondary | ICD-10-CM | POA: Diagnosis not present

## 2024-07-14 DIAGNOSIS — J019 Acute sinusitis, unspecified: Secondary | ICD-10-CM | POA: Diagnosis not present

## 2024-08-16 ENCOUNTER — Other Ambulatory Visit: Payer: Self-pay | Admitting: Cardiology

## 2024-08-16 DIAGNOSIS — I480 Paroxysmal atrial fibrillation: Secondary | ICD-10-CM

## 2024-08-17 NOTE — Telephone Encounter (Addendum)
 Xarelto  20mg  refill request received. Pt is 63 years old, weight-105.2kg, Crea-0.90 on 09/09/23 via [facility] from Dr. Larnell, last seen by Dr. Ladona on 02/02/24, Diagnosis-Afib, CrCl-103.5 mL/min; Dose is appropriate based on dosing criteria.    Called for labs to be faxed from PCP that were done 01/08/24.  Labs received from PCP. Crea-0.60 on 01/08/24 and in scanned labs
